# Patient Record
Sex: Male | Born: 1990
Health system: Southern US, Community
[De-identification: ages and names within clinical notes are randomized; demographics above are authoritative.]

## PROBLEM LIST (undated history)

## (undated) DIAGNOSIS — R519 Headache, unspecified: Secondary | ICD-10-CM

## (undated) DIAGNOSIS — R569 Unspecified convulsions: Secondary | ICD-10-CM

## (undated) DIAGNOSIS — M549 Dorsalgia, unspecified: Secondary | ICD-10-CM

## (undated) DIAGNOSIS — F419 Anxiety disorder, unspecified: Secondary | ICD-10-CM

## (undated) DIAGNOSIS — F32A Depression, unspecified: Secondary | ICD-10-CM

## (undated) HISTORY — PX: HERNIA REPAIR: SHX51

---
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
## 2011-12-01 ENCOUNTER — Ambulatory Visit (INDEPENDENT_AMBULATORY_CARE_PROVIDER_SITE_OTHER): Payer: 59 | Admitting: Internal Medicine

## 2011-12-01 VITALS — BP 117/66 | HR 68 | Temp 98.3°F | Resp 16 | Ht 71.0 in | Wt 212.0 lb

## 2011-12-01 DIAGNOSIS — S335XXA Sprain of ligaments of lumbar spine, initial encounter: Secondary | ICD-10-CM

## 2011-12-01 DIAGNOSIS — F17200 Nicotine dependence, unspecified, uncomplicated: Secondary | ICD-10-CM | POA: Insufficient documentation

## 2011-12-01 MED ORDER — MELOXICAM 15 MG PO TABS: 15.0000 mg | ORAL_TABLET | Freq: Every day | ORAL | Status: DC

## 2011-12-01 MED ORDER — CYCLOBENZAPRINE HCL 10 MG PO TABS: 10.0000 mg | ORAL_TABLET | Freq: Every day | ORAL | Status: DC

## 2011-12-01 NOTE — Progress Notes (Signed)
  Subjective:    Patient ID: Joseph Vazquez, male    DOB: May 18, 1990, 21 y.o.   MRN: [id number]  CC: 21yo W M c/o LBP  HPI After throwing Joseph Vazquez with friends yesterday the pt began to notice LBP.  His back did not bother him during the activity and he had no falls or injuries while participating.  He first noticed the pain upon standing up after resting.  He describes the pain as  "pinch" with occasional radiation along the lateral aspect of his R thigh that stops about half way down.  Standing and leaning back reproduce his radicular symptoms, but the pain in his low back is constant.  He was able to sleep well last night, but had to leave work today d/t pain.  He is a Investment banker, operational M-F day shift.  He tried taking Advil, but it did not help.    1 year ago he had similar symptoms.  Again, there was no injury, he just noticed this shooting pain when standing up out of his bed.  The problem self resolved within a few days.     Review of Systems GI: No problems with urination GU: No problems with BM Neuro: No sensory deficits    Objective:   Physical Exam General: 21 yo M pt cooperative and in no acute distress Vitals:  Filed Vitals:   12/01/11 1444  BP: 117/66  Pulse: 68  Temp: 98.3 F (36.8 C)  Resp: 16  HEENT: nontraumatic, EOMIT, normal to external exam, trachea midline Heart: Regular rate Lungs: No acute respiratory distress, no audible wheezing MSK: Normal bulk and tone. LE: 5/5 strength bilaterally  Lumbar Region: No bruising or deformity, tender to palpation paraspinally at L3/L4, L5 sp.p tender to palaption, full ROM but lumbar pain with extension and radicular pain down the lateral aspect of the R thigh with R facet loading.   Neuro: Alert, oriented, CN II - XII grossly IT, LE - sensory IT bilaterally, reflexes 1/4 bilaterally       Assessment & Plan:  1) Low back sprain -Flexeril 10 mg -Mobic 15 mg -Low back exercises -Note for work (rest for 2 days) -RTC if not better in 4  wks/sooner if not making progress so we can start formal PT

## 2011-12-10 ENCOUNTER — Ambulatory Visit: Payer: 59

## 2011-12-10 ENCOUNTER — Ambulatory Visit (INDEPENDENT_AMBULATORY_CARE_PROVIDER_SITE_OTHER): Payer: 59 | Admitting: Emergency Medicine

## 2011-12-10 VITALS — BP 110/78 | HR 74 | Temp 98.2°F | Resp 18 | Ht 71.0 in | Wt 216.0 lb

## 2011-12-10 DIAGNOSIS — M545 Low back pain: Secondary | ICD-10-CM

## 2011-12-10 DIAGNOSIS — N419 Inflammatory disease of prostate, unspecified: Secondary | ICD-10-CM

## 2011-12-10 DIAGNOSIS — N509 Disorder of male genital organs, unspecified: Secondary | ICD-10-CM

## 2011-12-10 DIAGNOSIS — N50811 Right testicular pain: Secondary | ICD-10-CM

## 2011-12-10 LAB — POCT UA - MICROSCOPIC ONLY
Bacteria, U Microscopic: NEGATIVE
Casts, Ur, LPF, POC: NEGATIVE
Crystals, Ur, HPF, POC: NEGATIVE
Mucus, UA: NEGATIVE
Yeast, UA: POSITIVE

## 2011-12-10 LAB — POCT CBC
Granulocyte percent: 73.9 %G (ref 37–80)
MCV: 97.7 fL — AB (ref 80–97)
MID (cbc): 0.5 (ref 0–0.9)
POC Granulocyte: 7.5 — AB (ref 2–6.9)
POC LYMPH PERCENT: 21.1 %L (ref 10–50)
POC MID %: 5 %M (ref 0–12)
Platelet Count, POC: 383 10*3/uL (ref 142–424)
RDW, POC: 12.3 %

## 2011-12-10 LAB — POCT URINALYSIS DIPSTICK
Bilirubin, UA: NEGATIVE
Blood, UA: NEGATIVE
Glucose, UA: NEGATIVE
Ketones, UA: NEGATIVE
Nitrite, UA: NEGATIVE
Protein, UA: NEGATIVE
Spec Grav, UA: 1.02
Urobilinogen, UA: 0.2
pH, UA: 7.5

## 2011-12-10 MED ORDER — DOXYCYCLINE HYCLATE 100 MG PO CAPS: 100.0000 mg | ORAL_CAPSULE | Freq: Two times a day (BID) | ORAL | Status: DC

## 2011-12-10 NOTE — Progress Notes (Signed)
  Subjective:    Patient ID: Joseph Vazquez, male    DOB: [birth year], 21 y.o.   MRN: [id number]  HPI patient was seen here last week with back pain. He enters today with continued back pain and pain in his testicles. He denies any burning on urination. He denies any pain with twisting of his back. He has no history of kidney stones and has seen no blood in his urine. He has not been sexually active for 6 months he does not have any discharge.    Review of Systems     Objective:   Physical Exam patient is alert cooperative in no distress. HEENT exam is unremarkable. His chest is clear his abdomen is soft nontender. There is tenderness over lower lumbar spine. Motor strength is 5 out of 5. Rectal exam was performed which revealed a normal size prostate which was nontender Results for orders placed in visit on 12/10/11  POCT UA - MICROSCOPIC ONLY      Component Value Range   WBC, Ur, HPF, POC 1-4     RBC, urine, microscopic 0-1     Bacteria, U Microscopic neg     Mucus, UA neg     Epithelial cells, urine per micros 0-4     Crystals, Ur, HPF, POC neg     Casts, Ur, LPF, POC neg     Yeast, UA positive    POCT URINALYSIS DIPSTICK      Component Value Range   Color, UA yellow     Clarity, UA clear     Glucose, UA neg     Bilirubin, UA neg     Ketones, UA neg     Spec Grav, UA 1.020     Blood, UA neg     pH, UA 7.5     Protein, UA neg     Urobilinogen, UA 0.2     Nitrite, UA neg     Leukocytes, UA Trace    POCT CBC      Component Value Range   WBC 10.1  4.6 - 10.2 K/uL   Lymph, poc 2.1  0.6 - 3.4   POC LYMPH PERCENT 21.1  10 - 50 %L   MID (cbc) 0.5  0 - 0.9   POC MID % 5.0  0 - 12 %M   POC Granulocyte 7.5 (*) 2 - 6.9   Granulocyte percent 73.9  37 - 80 %G   RBC 5.42  4.69 - 6.13 M/uL   Hemoglobin 17.2  14.1 - 18.1 g/dL   HCT, POC 84.1  32.4 - 53.7 %   MCV 97.7 (*) 80 - 97 fL   MCH, POC 31.7 (*) 27 - 31.2 pg   MCHC 32.5  31.8 - 35.4 g/dL   RDW, POC 40.1     Platelet Count, POC  383  142 - 424 K/uL   MPV 8.2  0 - 99.8 fL   [facility] reading (PRIMARY) by  Dr.Kimbella Heisler LS-spine films are normal. KUB is normal.        Assessment & Plan:  His WBC is upper limits of normal.  Will cover with doxycycline and schedule an ultrasound of the testicles and a non-contrast CT of the kidneys.

## 2011-12-11 LAB — GC/CHLAMYDIA PROBE AMP, GENITAL
Chlamydia, DNA Probe: NEGATIVE
GC Probe Amp, Genital: NEGATIVE

## 2011-12-11 NOTE — Addendum Note (Signed)
Addended byCaffie Damme on: 12/11/2011 11:11 AM   Modules accepted: Orders

## 2011-12-15 ENCOUNTER — Ambulatory Visit
Admission: RE | Admit: 2011-12-15 | Discharge: 2011-12-15 | Disposition: A | Discharge status: Home / Self Care | Payer: 59 | Source: Ambulatory Visit | Attending: Emergency Medicine | Admitting: Emergency Medicine

## 2011-12-15 DIAGNOSIS — N50811 Right testicular pain: Secondary | ICD-10-CM

## 2011-12-15 DIAGNOSIS — N419 Inflammatory disease of prostate, unspecified: Secondary | ICD-10-CM

## 2011-12-17 ENCOUNTER — Ambulatory Visit
Admission: RE | Admit: 2011-12-17 | Discharge: 2011-12-17 | Disposition: A | Discharge status: Home / Self Care | Payer: 59 | Source: Ambulatory Visit | Attending: Emergency Medicine | Admitting: Emergency Medicine

## 2011-12-17 DIAGNOSIS — N50811 Right testicular pain: Secondary | ICD-10-CM

## 2011-12-17 DIAGNOSIS — M545 Low back pain: Secondary | ICD-10-CM

## 2011-12-18 ENCOUNTER — Telehealth: Payer: Self-pay

## 2011-12-18 NOTE — Telephone Encounter (Signed)
Pt calling about CT and [place]. Gave him the results.

## 2011-12-18 NOTE — Telephone Encounter (Signed)
Pt returned call regarding his tests.  [phone]

## 2011-12-21 ENCOUNTER — Telehealth: Payer: Self-pay

## 2011-12-21 NOTE — Telephone Encounter (Signed)
The patient's mother called to request clinical staff return her call to discuss results of CT scan and ultrasound done last week.  Please call the patient's mother Efraim Kaufmann at work at [phone].

## 2011-12-22 NOTE — Telephone Encounter (Signed)
She is not on his HIPAA/ patient has been advised of the results. I called patient, to advise he needs to call his mother, we can not. He did not seem aware she had called.

## 2012-07-05 ENCOUNTER — Ambulatory Visit: Payer: 59

## 2012-07-05 ENCOUNTER — Ambulatory Visit (INDEPENDENT_AMBULATORY_CARE_PROVIDER_SITE_OTHER): Payer: 59 | Admitting: Family Medicine

## 2012-07-05 VITALS — BP 119/79 | HR 80 | Temp 98.3°F | Resp 18 | Wt 230.0 lb

## 2012-07-05 DIAGNOSIS — M25561 Pain in right knee: Secondary | ICD-10-CM

## 2012-07-05 DIAGNOSIS — M25569 Pain in unspecified knee: Secondary | ICD-10-CM

## 2012-07-05 DIAGNOSIS — S8991XA Unspecified injury of right lower leg, initial encounter: Secondary | ICD-10-CM

## 2012-07-05 DIAGNOSIS — S8990XA Unspecified injury of unspecified lower leg, initial encounter: Secondary | ICD-10-CM

## 2012-07-05 HISTORY — DX: Pain in right knee: M25.561

## 2012-07-05 MED ORDER — MELOXICAM 15 MG PO TABS: 15.0000 mg | ORAL_TABLET | Freq: Every day | ORAL | Status: DC

## 2012-07-05 NOTE — Assessment & Plan Note (Signed)
Patient's exam is consistent with potential early patellofemoral pain syndrome. I do not see any OCD lesion or any other significant bony abnormalities. We do have already depending on the x-ray of the right knee. Patient was given meloxicam and home exercise program. Patient will continue his regular work and then come back again in 2-3 weeks for further evaluation. If for some reason it seems to worsen we may want to consider further imaging with an MRI as well as corticosteroid injection and formal physical therapy.

## 2012-07-05 NOTE — Progress Notes (Signed)
Reason for visit: Right knee pain  History of present illness: Patient is a 22 year old male coming in with right knee pain for multiple weeks duration. Patient does not remember any true injury. Patient denies any radiation, numbness or swelling. Patient states it's worse when he goes from sitting to standing position or stands for long amount of time. Patient states that the pain seems to be superior to the knee itself on the anterior aspect. Patient denies any fever, chills, or an abnormal weight loss. 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 Patient denies any nighttime awakening. Patient does state that he is crepitus from time to time that can hurt.   Past medical history, social, surgical and family history all reviewed.   Physical exam Blood pressure 119/79, pulse 80, temperature 98.3 F (36.8 C), temperature source Oral, resp. rate 18, weight 230 lb (104.327 kg). General: No apparent distress alert and oriented x3 mood and affect normal Respiratory: Patient's speak in full sentences and does not appear short of breath Skin: Warm dry intact with no signs of infection or rash Neuro: Cranial nerves II through XII are intact, neurovascularly intact in all extremities with 2+ DTRs and 2+ pulses. Right knee exam: On inspection no gross deformity. Patient does have full active and passive range of motion. He is nontender on palpation of the entire knee. All ligaments are intact and has a negative McMurray's as well as a negative Thessaly Neurovascularly intact distally.  X-rays were ordered, reviewed and interpreted by me today. Patient's x-ray did not show any significant bony abnormality but did have some trabecular changes of the bone of the distal femoral condyle. This is only seen on one image though.  No fracture.

## 2012-07-05 NOTE — Patient Instructions (Signed)
Patellofemoral Pain  Your exam shows your knee pain is probably due to a problem with the knee cap, the patella. This problem is also called patellofemoral pain, runner's knee, or chondromalacia. Most of the time, this problem is due to overuse of the knee joint. Repeated bending and straightening can irritate the underside of the knee cap. When this happens, activities such as running, walking, climbing, biking or jumping usually produce pain. Pain may also occur after prolonged sitting. Other patellofemoral symptoms can include joint stiffness, swelling, and a snapping or grinding sensation with movement. Rest and rehabilitation are usually successful in treating this problem. Surgery is rarely needed.  Treatment includes correcting any mechanical factors that could hurt the normal working of the knee. This could be weak thigh muscles or foot problems. Avoid repetitive activities of the knee until the pain and other symptoms improve. Apply ice packs over the knee for 20 to 30 minutes every 2 to 4 hours to reduce pain and swelling. Only take over-the-counter or prescription medicines for pain, discomfort, or fever as directed by your caregiver. Knee braces or neoprene sleeves may help reduce irritation. Rehabilitation exercises to strengthen the quad muscle are often prescribed when your symptoms are better. Call your caregiver for a follow-up exam to evaluate your response to treatment.  Document Released: 03/05/2004 Document Revised: 04/20/2011 Document Reviewed: 01/26/2005  Exit[employer]® Patient Information ©2014 [employer].

## 2013-01-07 ENCOUNTER — Encounter (HOSPITAL_COMMUNITY): Payer: Self-pay | Admitting: Emergency Medicine

## 2013-01-07 ENCOUNTER — Emergency Department (HOSPITAL_COMMUNITY): Payer: 59

## 2013-01-07 ENCOUNTER — Emergency Department (HOSPITAL_COMMUNITY)
Admission: EM | Admit: 2013-01-07 | Discharge: 2013-01-07 | Disposition: A | Discharge status: Home / Self Care | Payer: 59 | Attending: Emergency Medicine | Admitting: Emergency Medicine

## 2013-01-07 DIAGNOSIS — Y929 Unspecified place or not applicable: Secondary | ICD-10-CM | POA: Insufficient documentation

## 2013-01-07 DIAGNOSIS — Y9323 Activity, snow (alpine) (downhill) skiing, snow boarding, sledding, tobogganing and snow tubing: Secondary | ICD-10-CM | POA: Insufficient documentation

## 2013-01-07 DIAGNOSIS — S43102A Unspecified dislocation of left acromioclavicular joint, initial encounter: Secondary | ICD-10-CM

## 2013-01-07 DIAGNOSIS — Z79899 Other long term (current) drug therapy: Secondary | ICD-10-CM | POA: Insufficient documentation

## 2013-01-07 DIAGNOSIS — S43109A Unspecified dislocation of unspecified acromioclavicular joint, initial encounter: Secondary | ICD-10-CM | POA: Insufficient documentation

## 2013-01-07 DIAGNOSIS — F17200 Nicotine dependence, unspecified, uncomplicated: Secondary | ICD-10-CM | POA: Insufficient documentation

## 2013-01-07 MED ORDER — OXYCODONE-ACETAMINOPHEN 5-325 MG PO TABS: 2.0000 | ORAL_TABLET | ORAL | Status: DC | PRN

## 2013-01-07 MED ORDER — METHOCARBAMOL 750 MG PO TABS: 750.0000 mg | ORAL_TABLET | Freq: Four times a day (QID) | ORAL | Status: DC

## 2013-01-07 NOTE — ED Notes (Signed)
Pt was snow boarding today in [place] at 10 am and fell on his left shoulder.  Pt is unable to lift arm,  5/10 pain left shoulder.  No other complaints.

## 2013-01-07 NOTE — ED Provider Notes (Signed)
CSN: [id number]     Arrival date & time 01/07/13  2134 History   First MD Initiated Contact with Patient 01/07/13 2157     Chief Complaint  Patient presents with  . Shoulder Pain   (Consider location/radiation/quality/duration/timing/severity/associated sxs/prior Treatment) Patient is a 22 y.o. male presenting with shoulder pain. The history is provided by the patient and a parent.  Shoulder Pain   patient here after falling on his left shoulder while snowboarding. No head injury or LOC. There is a severe sharp pain to the anterior portion of his left shoulder that is worse with movement. Denies any numbness to his shoulder or to his left hand. Symptoms are worse with movement better with rest. No treatment used prior to arrival  History reviewed. No pertinent past medical history. History reviewed. No pertinent past surgical history. No family history on file. History  Substance Use Topics  . Smoking status: Current Every Day Smoker -- 0.05 packs/day    Types: Cigarettes  . Smokeless tobacco: Current User  . Alcohol Use: Yes     Comment: Socially    Review of Systems  All other systems reviewed and are negative.    Allergies  Review of patient's allergies indicates no known allergies.  Home Medications   Current Outpatient Rx  Name  Route  Sig  Dispense  Refill  . naproxen sodium (ANAPROX) 220 MG tablet   Oral   Take 220 mg by mouth 2 (two) times daily with a meal.          BP 140/90  Pulse 84  Temp(Src) 98.4 F (36.9 C) (Oral)  Resp 19  Ht 6' (1.829 m)  Wt 225 lb (102.059 kg)  BMI 30.51 kg/m2  SpO2 97% Physical Exam  Nursing note and vitals reviewed. Constitutional: He is oriented to person, place, and time. He appears well-developed and well-nourished.  Non-toxic appearance. No distress.  HENT:  Head: Normocephalic and atraumatic.  Eyes: Conjunctivae, EOM and lids are normal. Pupils are equal, round, and reactive to light.  Neck: Normal range of motion.  Neck supple. No tracheal deviation present. No mass present.  Cardiovascular: Normal rate, regular rhythm and normal heart sounds.  Exam reveals no gallop.   No murmur heard. Pulmonary/Chest: Effort normal and breath sounds normal. No stridor. No respiratory distress. He has no decreased breath sounds. He has no wheezes. He has no rhonchi. He has no rales.  Abdominal: Soft. Normal appearance and bowel sounds are normal. He exhibits no distension. There is no tenderness. There is no rebound and no CVA tenderness.  Musculoskeletal: Normal range of motion. He exhibits no edema and no tenderness.       Arms: Neurological: He is alert and oriented to person, place, and time. He has normal strength. No cranial nerve deficit or sensory deficit. GCS eye subscore is 4. GCS verbal subscore is 5. GCS motor subscore is 6.  Skin: Skin is warm and dry. No abrasion and no rash noted.  Psychiatric: He has a normal mood and affect. His speech is normal and behavior is normal.    ED Course  Procedures (including critical care time) Labs Review Labs Reviewed - No data to display Imaging Review Dg Shoulder Left  01/07/2013   CLINICAL DATA:  22 year old male with left shoulder pain following injury.  EXAM: LEFT SHOULDER - 2+ VIEW  COMPARISON:  None.  FINDINGS: Offset at the [facility] joint is noted. There is equivocal widening of the coracoclavicular distance.  There is no evidence  of fracture.  The humeral head is located.  The remainder of the visualized left bony thorax is unremarkable.  IMPRESSION: AC separation. Coracoclavicular distance is upper limits of normal and this may represent a type 3 separation.   Electronically Signed   By: Laveda Abbe M.D.   On: 01/07/2013 22:19    EKG Interpretation   None       MDM  No diagnosis found. Patient without signs of dislocation to the shoulder but does have a type III a.c. separation per radiology. Will be given a sling, pain medication, orthopedic  referral    Toy Baker, MD 01/07/13 2233

## 2013-01-07 NOTE — ED Notes (Signed)
Left arm sling applied.

## 2013-01-07 NOTE — ED Notes (Signed)
Pt states he was snowboarding today and he fell and landed on his left shoulder. Pt has very limited ROM in the shoulder but can move his distal extremity. Pulses and sensation intact.

## 2014-01-08 ENCOUNTER — Ambulatory Visit (INDEPENDENT_AMBULATORY_CARE_PROVIDER_SITE_OTHER): Payer: 59 | Admitting: Internal Medicine

## 2014-01-08 VITALS — BP 124/78 | HR 86 | Temp 98.7°F | Resp 18 | Ht 71.25 in | Wt 102.4 lb

## 2014-01-08 DIAGNOSIS — A938 Other specified arthropod-borne viral fevers: Secondary | ICD-10-CM

## 2014-01-08 DIAGNOSIS — R21 Rash and other nonspecific skin eruption: Secondary | ICD-10-CM

## 2014-01-08 MED ORDER — DOXYCYCLINE HYCLATE 100 MG PO CAPS: 100.0000 mg | ORAL_CAPSULE | Freq: Two times a day (BID) | ORAL | Status: DC

## 2014-01-08 NOTE — Progress Notes (Signed)
   Subjective:    Patient ID: Joseph Vazquez, male    DOB: May 03, 1990, 23 y.o.   MRN: [id number]  HPI   23 year old white male present with chief complaint of tic bite  Removed tic 2 days ago from the left flank area.   The area is reddened and his mother and dad are concerned related to possible tic related illnesses.   He was mowing the yard a few days before the tic was discovered. No known fever, feels warm at times, no nausea or vomiting,  no joint pain just the red area around where the tic was removed 2 days ago.  No stiff neck or headache no rash..      Review of Systems  Constitutional: Negative.   HENT: Negative.   Eyes: Negative.   Respiratory: Negative.   Cardiovascular: Negative.   Gastrointestinal: Negative.   Endocrine: Negative.   Genitourinary: Negative.   Musculoskeletal: Negative.   Skin: Positive for wound.       Red area around area where a tic was removed 2 days ago  Allergic/Immunologic: Negative.   Neurological: Negative.   Hematological: Negative.   Psychiatric/Behavioral: Negative.   All other systems reviewed and are negative.      Objective:   Physical Exam  Constitutional: He is oriented to person, place, and time. He appears well-developed and well-nourished.  HENT:  Head: Normocephalic.  Right Ear: External ear normal.  Left Ear: External ear normal.  Nose: Nose normal.  Mouth/Throat: Oropharynx is clear and moist.  Eyes: EOM are normal. Pupils are equal, round, and reactive to light.  Neck: Normal range of motion. Neck supple.  Cardiovascular: Normal rate, regular rhythm and normal heart sounds.   Pulmonary/Chest: Effort normal and breath sounds normal.  Abdominal: Soft. Bowel sounds are normal.  Musculoskeletal: Normal range of motion.  Neurological: He is alert and oriented to person, place, and time.  Skin: Skin is warm and dry. Rash noted.  1cm area of induration and erythema around the area where a tic was removed 2 days ago on  the left flank No fluctuance or drainage no ring around it   Psychiatric: He has a normal mood and affect. His behavior is normal. Thought content normal.          Assessment & Plan:  Tic bite with area of induration and erythema surrounding it . Tic was removed 2 days ago , no systemic sighs of illness. Will rx with doxycycline.

## 2014-01-08 NOTE — Patient Instructions (Signed)
Take meds as directed. I f you develop fever vomiting rash return to the office. finish all the antibiotics. Stay out of the sun when you are taking this antibiotic.

## 2014-07-24 ENCOUNTER — Ambulatory Visit (INDEPENDENT_AMBULATORY_CARE_PROVIDER_SITE_OTHER): Payer: 59 | Admitting: Physician Assistant

## 2014-07-24 VITALS — BP 130/70 | HR 75 | Temp 98.7°F | Resp 17 | Ht 71.0 in | Wt 225.0 lb

## 2014-07-24 DIAGNOSIS — L0291 Cutaneous abscess, unspecified: Secondary | ICD-10-CM | POA: Diagnosis not present

## 2014-07-24 DIAGNOSIS — D72829 Elevated white blood cell count, unspecified: Secondary | ICD-10-CM

## 2014-07-24 LAB — POCT CBC
GRANULOCYTE PERCENT: 82.1 % — AB (ref 37–80)
HCT, POC: 46.7 % (ref 43.5–53.7)
Hemoglobin: 15.8 g/dL (ref 14.1–18.1)
Lymph, poc: 1.6 (ref 0.6–3.4)
MCH, POC: 31.1 pg (ref 27–31.2)
MCHC: 33.8 g/dL (ref 31.8–35.4)
MCV: 92.1 fL (ref 80–97)
MID (CBC): 0.5 (ref 0–0.9)
MPV: 6.9 fL (ref 0–99.8)
PLATELET COUNT, POC: 358 10*3/uL (ref 142–424)
POC GRANULOCYTE: 9.5 — AB (ref 2–6.9)
POC LYMPH %: 13.5 % (ref 10–50)
POC MID %: 4.4 %M (ref 0–12)
RBC: 5.07 M/uL (ref 4.69–6.13)
RDW, POC: 12.7 %
WBC: 11.6 10*3/uL — AB (ref 4.6–10.2)

## 2014-07-24 MED ORDER — DOXYCYCLINE HYCLATE 100 MG PO CAPS: 100.0000 mg | ORAL_CAPSULE | Freq: Two times a day (BID) | ORAL | Status: DC

## 2014-07-24 MED ORDER — CEFTRIAXONE SODIUM 1 G IJ SOLR
1.0000 g | Freq: Once | INTRAMUSCULAR | Status: AC
  Administered 2014-07-24: 1 g via INTRAMUSCULAR

## 2014-07-24 NOTE — Progress Notes (Signed)
07/24/2014 at 5:43 PM  Joseph Vazquez / DOB: Feb 05, 1991 / MRN: [id number]  The patient has GERD; OTHER SEBORRHEIC DERMATITIS; HYPERHIDROSIS; Nicotine addiction; and Right knee pain on his problem list.  SUBJECTIVE  Chief complaint: Abscess  Patient here today for an painful red mass on his upper left thigh roughly 2/3 distance along the inguinal crease. Reports this started 2 days ago and is worsening.  He tried to "pop it" a few times but states it is too tender.  Reports he has not had sex in over a year, and he denies penile discharge, dysuria.  He denies lesions on his penis and testicles, palms and soles.     He  has no past medical history on file.    Medications reviewed and updated by myself where necessary, and exist elsewhere in the encounter.   Joseph Vazquez has No Known Allergies. He  reports that he has been smoking Cigarettes.  He has been smoking about 0.05 packs per day. He uses smokeless tobacco. He reports that he drinks alcohol. He reports that he uses illicit drugs (Marijuana). He  reports that he currently engages in sexual activity. The patient  has no past surgical history on file.  His family history is not on file.  Review of Systems  Constitutional: Positive for diaphoresis. Negative for fever and malaise/fatigue.  Gastrointestinal: Negative for nausea, vomiting and abdominal pain.  Genitourinary: Negative for dysuria, urgency and frequency.  Musculoskeletal: Negative for myalgias.  Skin: Positive for rash. Negative for itching.  Neurological: Negative for dizziness and headaches.    OBJECTIVE  His  height is 5\' 11"  (1.803 m) and weight is 225 lb (102.059 kg). His oral temperature is 98.7 F (37.1 C). His blood pressure is 130/70 and his pulse is 75. His respiration is 17 and oxygen saturation is 98%.  The patient's body mass index is 31.39 kg/(m^2).  Physical Exam  Nursing note and vitals reviewed. Constitutional: He is oriented to person, place, and time.    Cardiovascular: Normal rate, regular rhythm and normal heart sounds.   Respiratory: Effort normal and breath sounds normal.  GI: Soft. Bowel sounds are normal. Hernia confirmed negative in the right inguinal area and confirmed negative in the left inguinal area.  Genitourinary: Testes normal and penis normal.    Right testis shows no mass, no swelling and no tenderness. Left testis shows no mass, no swelling and no tenderness. No penile tenderness. No discharge found.  Lymphadenopathy:       Right: No inguinal adenopathy present.       Left: No inguinal adenopathy present.  Neurological: He is alert and oriented to person, place, and time.    No results found for this or any previous visit (from the past 24 hour(s)).  ASSESSMENT & PLAN  Joseph Vazquez was seen today for abscess.  Diagnoses and all orders for this visit:  Abscess: The appearance of the lesion is consistent with moderately severe folliculitis. This may however be hydradenitis given location.  Will cover for staph with Doxy, as patient has had this before and tolerated without incident. Given the lack of fluctuance today advised that we do not drain the lesion, but advised patient that this will be reasonable if his symptoms do not improve with abx treatment.    Orders: -     POCT CBC -     Ceftriaxone 1 gram -     Doxycycline 100 mg po bid 10 days.     The patient was advised  to call or come back to clinic if he does not see an improvement in symptoms, or worsens with the above plan.   Deliah Boston, MHS, PA-C [facility] 07/24/2014 5:43 PM

## 2014-07-24 NOTE — Patient Instructions (Signed)

## 2014-11-15 ENCOUNTER — Ambulatory Visit (INDEPENDENT_AMBULATORY_CARE_PROVIDER_SITE_OTHER): Payer: 59

## 2014-11-15 ENCOUNTER — Ambulatory Visit (INDEPENDENT_AMBULATORY_CARE_PROVIDER_SITE_OTHER): Payer: 59 | Admitting: Family Medicine

## 2014-11-15 VITALS — BP 118/70 | HR 74 | Temp 98.5°F | Resp 18 | Ht 71.0 in | Wt 234.8 lb

## 2014-11-15 DIAGNOSIS — S99922A Unspecified injury of left foot, initial encounter: Secondary | ICD-10-CM | POA: Diagnosis not present

## 2014-11-15 NOTE — Patient Instructions (Signed)
You can ice the toe 3 times per day for 15 minutes.   Please let us know if the pain does not go away after the next 2 weeks.   Wear the coban as long as there is pain at that fifth toe.

## 2014-11-15 NOTE — Progress Notes (Signed)
[facility] [address] [place] [address] [phone]  Date:  11/15/2014   Name:  Joseph Vazquez   DOB:  [birth year]   MRN:  [id number]  PCP:  No PCP Per Patient    History of Present Illness:  Joseph Vazquez is a 24 y.o. male patient who present to [facility] for toe pain.  2 days ago, he was stepping over a doggy gate, when he hit his left fifth toe.  There was initial pain and bruising where the whole fifth toe turned dark.  He is able to bear weight, normal sensation, but the pain has worsened.     Patient Active Problem List   Diagnosis Date Noted  . Right knee pain 07/05/2012  . Nicotine addiction 12/01/2011  . GERD 09/17/2008  . OTHER SEBORRHEIC DERMATITIS 09/17/2008  . HYPERHIDROSIS 09/17/2008    History reviewed. No pertinent past medical history.  History reviewed. No pertinent past surgical history.  Social History  Substance Use Topics  . Smoking status: Current Every Day Smoker -- 0.05 packs/day    Types: Cigarettes  . Smokeless tobacco: Current User  . Alcohol Use: Yes     Comment: Socially    History reviewed. No pertinent family history.  No Known Allergies  Medication list has been reviewed and updated.  Current Outpatient Prescriptions on File Prior to Visit  Medication Sig Dispense Refill  . doxycycline (VIBRAMYCIN) 100 MG capsule Take 1 capsule (100 mg total) by mouth 2 (two) times daily. (Patient not taking: Reported on 11/15/2014) 20 capsule 0  . methocarbamol (ROBAXIN-750) 750 MG tablet Take 1 tablet (750 mg total) by mouth 4 (four) times daily. (Patient not taking: Reported on 11/15/2014) 30 tablet 0  . naproxen sodium (ANAPROX) 220 MG tablet Take 220 mg by mouth 2 (two) times daily with a meal.     No current facility-administered medications on file prior to visit.    ROS ROS otherwise unremarkable unless listed above.    Physical Examination: BP 118/70 mmHg  Pulse 74  Temp(Src) 98.5 F (36.9 C) (Oral)  Resp 18  Ht 5'  11" (1.803 m)  Wt 234 lb 12.8 oz (106.505 kg)  BMI 32.76 kg/m2  SpO2 99% Ideal Body Weight: Weight in (lb) to have BMI = 25: 178.9  Physical Exam  Constitutional: He is oriented to person, place, and time. He appears well-developed and well-nourished. No distress.  HENT:  Head: Normocephalic and atraumatic.  Eyes: Conjunctivae and EOM are normal. Pupils are equal, round, and reactive to light.  Cardiovascular: Normal rate.   Pulmonary/Chest: Effort normal. No respiratory distress.  Musculoskeletal:  Fifth toe erythematous, with superficial petechiae at the 5th medal side of digit.  No open wounds.  Tender with palpaton.  Tender at the mtp dominant.  Normal resisted dorsi-/plantar flexion.  Negative squeeze test.  Neurological: He is alert and oriented to person, place, and time.  Skin: Skin is warm and dry. He is not diaphoretic.  Psychiatric: He has a normal mood and affect. His behavior is normal.   [facility] reading (PRIMARY) by  Dr. Patsy Lager: Negative  Assessment and Plan: 24 year old male is here for chief complaint of toe pain.  Appears to be a sprain.  I am buddying the 5th and 4th toe at this time, and advised to discontinue when pain stops.  rtn in 2 weeks if sxs do not resolve.   Foot injury, left, initial encounter - Plan: DG Foot Complete Left  Judeth Cornfield  Lenox Ponds, PA-C [facility] 11/15/2014 4:17 PM

## 2015-07-15 ENCOUNTER — Other Ambulatory Visit: Payer: Self-pay | Admitting: Specialist

## 2015-07-15 DIAGNOSIS — G8929 Other chronic pain: Secondary | ICD-10-CM

## 2015-07-15 DIAGNOSIS — M545 Low back pain, unspecified: Secondary | ICD-10-CM

## 2015-07-30 ENCOUNTER — Ambulatory Visit
Admission: RE | Admit: 2015-07-30 | Discharge: 2015-07-30 | Disposition: A | Discharge status: Home / Self Care | Payer: 59 | Source: Ambulatory Visit | Attending: Specialist | Admitting: Specialist

## 2015-07-30 ENCOUNTER — Ambulatory Visit
Admission: RE | Admit: 2015-07-30 | Discharge: 2015-07-30 | Disposition: A | Discharge status: Home / Self Care | Payer: Self-pay | Source: Ambulatory Visit | Attending: Specialist | Admitting: Specialist

## 2015-07-30 DIAGNOSIS — M545 Low back pain: Principal | ICD-10-CM

## 2015-07-30 DIAGNOSIS — G8929 Other chronic pain: Secondary | ICD-10-CM

## 2015-07-30 MED ORDER — DIAZEPAM 5 MG PO TABS
10.0000 mg | ORAL_TABLET | Freq: Once | ORAL | Status: AC
  Administered 2015-07-30: 10 mg via ORAL

## 2015-07-30 MED ORDER — IOPAMIDOL (ISOVUE-M 200) INJECTION 41%
15.0000 mL | Freq: Once | INTRAMUSCULAR | Status: AC
  Administered 2015-07-30: 15 mL via INTRATHECAL

## 2015-07-30 NOTE — Discharge Instructions (Signed)

## 2015-12-25 ENCOUNTER — Telehealth (INDEPENDENT_AMBULATORY_CARE_PROVIDER_SITE_OTHER): Payer: Self-pay | Admitting: Specialist

## 2015-12-25 MED ORDER — ACETAMINOPHEN-CODEINE #3 300-30 MG PO TABS
1.0000 | ORAL_TABLET | Freq: Three times a day (TID) | ORAL | 0 refills | Status: DC | PRN
Start: 2015-12-25 — End: 2016-01-30

## 2015-12-25 NOTE — Telephone Encounter (Signed)
Called pt and left vm advising rx ready to be picked up at front desk  Thanks. Herbert SetaHeather

## 2015-12-25 NOTE — Telephone Encounter (Signed)
Rx refill for pain medication, pt states last time he received Tramadol and would like something different. States Tramadol hurts his stomach

## 2015-12-25 NOTE — Telephone Encounter (Signed)
See below

## 2016-01-28 ENCOUNTER — Telehealth (INDEPENDENT_AMBULATORY_CARE_PROVIDER_SITE_OTHER): Payer: Self-pay | Admitting: Specialist

## 2016-01-28 NOTE — Telephone Encounter (Signed)
Patient called needing Rx refilled (tylenol)  The number to contact him is [phone]

## 2016-01-28 NOTE — Telephone Encounter (Signed)
Holding this one for you to ask JN

## 2016-01-29 NOTE — Telephone Encounter (Signed)
Please advise, pt requesting refill on tylenol #3

## 2016-01-30 ENCOUNTER — Other Ambulatory Visit (INDEPENDENT_AMBULATORY_CARE_PROVIDER_SITE_OTHER): Payer: Self-pay | Admitting: Specialist

## 2016-01-30 MED ORDER — ACETAMINOPHEN-CODEINE #3 300-30 MG PO TABS
1.0000 | ORAL_TABLET | Freq: Three times a day (TID) | ORAL | 0 refills | Status: DC | PRN
Start: 2016-01-30 — End: 2016-03-17

## 2016-01-30 NOTE — Telephone Encounter (Signed)
Rx printed and signed, please call to his pharmacy. jen

## 2016-01-30 NOTE — Telephone Encounter (Signed)
rx called in to patients pharmacy, called patient and advised.  Thanks. Joseph Vazquez

## 2016-03-17 ENCOUNTER — Telehealth (INDEPENDENT_AMBULATORY_CARE_PROVIDER_SITE_OTHER): Payer: Self-pay | Admitting: Specialist

## 2016-03-17 ENCOUNTER — Other Ambulatory Visit (INDEPENDENT_AMBULATORY_CARE_PROVIDER_SITE_OTHER): Payer: Self-pay | Admitting: Specialist

## 2016-03-17 MED ORDER — ACETAMINOPHEN-CODEINE #3 300-30 MG PO TABS: 1.0000 | ORAL_TABLET | Freq: Three times a day (TID) | ORAL | 0 refills | Status: DC | PRN

## 2016-03-17 NOTE — Telephone Encounter (Signed)
PATIENT CALLED NEEDING A REFILL ON ACETAMINOPHEN CODEINE. CB # [phone] ALSO WANTED TO CHANGE HIS PHARMACY TO [facility].

## 2016-03-17 NOTE — Telephone Encounter (Signed)
Rx for tylenol #3  60 prescribed, signed and this can be called into pharmacy or he can pick it up. jen

## 2016-03-17 NOTE — Telephone Encounter (Signed)
PATIENT CALLED NEEDING A REFILL ON ACETAMINOPHEN CODEINE

## 2016-03-18 ENCOUNTER — Telehealth (INDEPENDENT_AMBULATORY_CARE_PROVIDER_SITE_OTHER): Payer: Self-pay | Admitting: Specialist

## 2016-03-18 ENCOUNTER — Telehealth (INDEPENDENT_AMBULATORY_CARE_PROVIDER_SITE_OTHER): Payer: Self-pay | Admitting: *Deleted

## 2016-03-18 NOTE — Telephone Encounter (Signed)
Patient called and stated [facility] on [address].  [facility]S informed the patient that they had not received a prescription from our office.  [phone]#[phone].  Thank You

## 2016-03-18 NOTE — Telephone Encounter (Signed)
Patient called in this morning in regards to wanting us to  Send his prescription to this [facility] on [address] location please. Thank you His CB # [phone].

## 2016-03-18 NOTE — Telephone Encounter (Signed)
Patiet got rx from [facility] on [address]

## 2016-03-18 NOTE — Telephone Encounter (Signed)
Patiet got rx from [facility] on [address] 

## 2016-03-18 NOTE — Telephone Encounter (Signed)
Left message on machine that his rx was called into pharmacy last night

## 2016-04-21 ENCOUNTER — Telehealth (INDEPENDENT_AMBULATORY_CARE_PROVIDER_SITE_OTHER): Payer: Self-pay | Admitting: Specialist

## 2016-04-21 NOTE — Telephone Encounter (Signed)
Pt requests refill of Acetaminophen with Codeine

## 2016-04-21 NOTE — Telephone Encounter (Signed)
Pt requests refill of Acetaminophen with Codeine   [phone]

## 2016-04-22 ENCOUNTER — Other Ambulatory Visit (INDEPENDENT_AMBULATORY_CARE_PROVIDER_SITE_OTHER): Payer: Self-pay | Admitting: Specialist

## 2016-04-22 MED ORDER — ACETAMINOPHEN-CODEINE #3 300-30 MG PO TABS: 1.0000 | ORAL_TABLET | Freq: Three times a day (TID) | ORAL | 0 refills | Status: DC | PRN

## 2016-04-22 NOTE — Telephone Encounter (Signed)
Rx printed, but he needs a follow up appointment before we can refill further.

## 2016-04-23 NOTE — Telephone Encounter (Signed)
Pt is aware rx is ready for pick up at the front desk. 

## 2016-05-25 ENCOUNTER — Telehealth (INDEPENDENT_AMBULATORY_CARE_PROVIDER_SITE_OTHER): Payer: Self-pay | Admitting: *Deleted

## 2016-05-25 NOTE — Telephone Encounter (Signed)
Pt needs refill acetaminophen with codeine. Sent to [facility] [address]

## 2016-05-25 NOTE — Telephone Encounter (Signed)
Pt needs refill acetaminophen with codeine

## 2016-06-01 ENCOUNTER — Other Ambulatory Visit (INDEPENDENT_AMBULATORY_CARE_PROVIDER_SITE_OTHER): Payer: Self-pay | Admitting: Specialist

## 2016-06-01 MED ORDER — ACETAMINOPHEN-CODEINE #3 300-30 MG PO TABS
1.0000 | ORAL_TABLET | Freq: Three times a day (TID) | ORAL | 0 refills | Status: DC | PRN
Start: 2016-06-01 — End: 2016-07-03

## 2016-06-02 ENCOUNTER — Telehealth (INDEPENDENT_AMBULATORY_CARE_PROVIDER_SITE_OTHER): Payer: Self-pay

## 2016-06-02 NOTE — Telephone Encounter (Signed)
[facility] [address]  Pt called and states that he has contacted his pharmacy and that his rx has still not been received. Would like this to be refaxed and called once this has been done.

## 2016-06-02 NOTE — Telephone Encounter (Signed)
Pt asked for any future RX to be sent to the [facility] on [address].

## 2016-06-02 NOTE — Telephone Encounter (Signed)
I called and advised pt that his rx was called to the [facility] at [address] 

## 2016-06-02 NOTE — Telephone Encounter (Signed)
I called and advised pt that his rx was called to the [facility] at [address] 

## 2016-06-02 NOTE — Progress Notes (Signed)
I called and advised pt that his rx was called to the [facility] at [address]

## 2016-07-03 ENCOUNTER — Other Ambulatory Visit (INDEPENDENT_AMBULATORY_CARE_PROVIDER_SITE_OTHER): Payer: Self-pay

## 2016-07-03 MED ORDER — ACETAMINOPHEN-CODEINE #3 300-30 MG PO TABS: 1.0000 | ORAL_TABLET | Freq: Three times a day (TID) | ORAL | 0 refills | Status: DC | PRN

## 2016-07-03 NOTE — Telephone Encounter (Signed)
Patient would like a Rx refill on Tylenol #3.  CB# is [phone]

## 2016-07-03 NOTE — Addendum Note (Signed)
Addended by: Penne LashSHUE WILLS, Otis DialsHRISTY N on: 07/03/2016 09:36 AM   Modules accepted: Orders

## 2016-07-03 NOTE — Telephone Encounter (Signed)
Lmom that rx was [facility] to [facility]

## 2016-08-04 ENCOUNTER — Other Ambulatory Visit (INDEPENDENT_AMBULATORY_CARE_PROVIDER_SITE_OTHER): Payer: Self-pay

## 2016-08-04 NOTE — Telephone Encounter (Signed)
Patient would like a Rx refill on Tylenol#3.  Cb# is [phone].  Please Advise.  Thank You.

## 2016-08-04 NOTE — Addendum Note (Signed)
Addended by: Penne LashSHUE WILLS, Neysa BonitoHRISTY N on: 08/04/2016 11:35 AM   Modules accepted: Orders

## 2016-08-05 MED ORDER — ACETAMINOPHEN-CODEINE #3 300-30 MG PO TABS: 1.0000 | ORAL_TABLET | Freq: Three times a day (TID) | ORAL | 0 refills | Status: DC | PRN

## 2016-08-05 NOTE — Telephone Encounter (Signed)
Called to [facility] [address]

## 2016-08-06 ENCOUNTER — Telehealth (INDEPENDENT_AMBULATORY_CARE_PROVIDER_SITE_OTHER): Payer: Self-pay | Admitting: Radiology

## 2016-08-06 NOTE — Telephone Encounter (Signed)
I called and spoke with Dorene SorrowJerry at [facility] [address] and they do have the  Rx, they just need the DEA # and I gave them this info and I advised them to call us back next time that this should not have delayed the patient from getting his meds.

## 2016-09-01 ENCOUNTER — Other Ambulatory Visit (INDEPENDENT_AMBULATORY_CARE_PROVIDER_SITE_OTHER): Payer: Self-pay

## 2016-09-01 NOTE — Telephone Encounter (Signed)
Patient would like a Rx refill on Tylenol #3.  Cb# is [phone].  Please advise.  Thank You.

## 2016-09-02 ENCOUNTER — Telehealth (INDEPENDENT_AMBULATORY_CARE_PROVIDER_SITE_OTHER): Payer: Self-pay | Admitting: Specialist

## 2016-09-02 NOTE — Addendum Note (Signed)
Addended by: Penne LashSHUE WILLS, Neysa BonitoHRISTY N on: 09/02/2016 10:35 AM   Modules accepted: Orders

## 2016-09-02 NOTE — Telephone Encounter (Signed)
Dr. Otelia SergeantNitka has a message regarding this in his box pending approval.

## 2016-09-02 NOTE — Telephone Encounter (Signed)
[facility] [address]. Pt states he called yesterday to request refill Tylenol #3. Please call in to pharmacy and confirm with pt.

## 2016-09-03 MED ORDER — ACETAMINOPHEN-CODEINE #3 300-30 MG PO TABS: 1.0000 | ORAL_TABLET | Freq: Three times a day (TID) | ORAL | 0 refills | Status: DC | PRN

## 2016-09-03 NOTE — Telephone Encounter (Signed)
I called rx to [facility], Patient is aware

## 2016-10-05 ENCOUNTER — Other Ambulatory Visit (INDEPENDENT_AMBULATORY_CARE_PROVIDER_SITE_OTHER): Payer: Self-pay | Admitting: Specialist

## 2016-10-05 NOTE — Telephone Encounter (Signed)
Patient called needing Rx refilled (Tylenol #3) Patient also asked if Dr Otelia Sergeant can refer him for dry needling. The number to contact patient is [phone]

## 2016-10-08 MED ORDER — ACETAMINOPHEN-CODEINE #3 300-30 MG PO TABS: 1.0000 | ORAL_TABLET | Freq: Three times a day (TID) | ORAL | 0 refills | Status: DC | PRN

## 2016-10-08 NOTE — Telephone Encounter (Signed)
rx called to [facility]---Patient is aware.

## 2016-11-03 ENCOUNTER — Other Ambulatory Visit (INDEPENDENT_AMBULATORY_CARE_PROVIDER_SITE_OTHER): Payer: Self-pay | Admitting: Specialist

## 2016-11-03 NOTE — Addendum Note (Signed)
Addended by: Penne Lash, Otis Dials on: 11/03/2016 04:57 PM   Modules accepted: Orders

## 2016-11-03 NOTE — Telephone Encounter (Signed)
Patient called asking for a refill on tylenol 3's. CB # [phone]

## 2016-11-05 MED ORDER — ACETAMINOPHEN-CODEINE #3 300-30 MG PO TABS: 1.0000 | ORAL_TABLET | Freq: Three times a day (TID) | ORAL | 0 refills | Status: DC | PRN

## 2016-11-05 NOTE — Telephone Encounter (Signed)
[facility] is aware

## 2017-01-07 ENCOUNTER — Telehealth (INDEPENDENT_AMBULATORY_CARE_PROVIDER_SITE_OTHER): Payer: Self-pay | Admitting: Radiology

## 2017-01-07 NOTE — Telephone Encounter (Signed)
Patient is calling wanting to know who you would recommend for pain management? Please advise.

## 2017-01-18 ENCOUNTER — Ambulatory Visit (INDEPENDENT_AMBULATORY_CARE_PROVIDER_SITE_OTHER): Payer: 59 | Admitting: Specialist

## 2017-02-15 ENCOUNTER — Ambulatory Visit (INDEPENDENT_AMBULATORY_CARE_PROVIDER_SITE_OTHER): Payer: PRIVATE HEALTH INSURANCE

## 2017-02-15 ENCOUNTER — Ambulatory Visit (INDEPENDENT_AMBULATORY_CARE_PROVIDER_SITE_OTHER): Payer: PRIVATE HEALTH INSURANCE | Admitting: Specialist

## 2017-02-15 ENCOUNTER — Encounter (INDEPENDENT_AMBULATORY_CARE_PROVIDER_SITE_OTHER): Payer: Self-pay | Admitting: Specialist

## 2017-02-15 VITALS — BP 127/83 | HR 73 | Ht 72.0 in | Wt 230.0 lb

## 2017-02-15 DIAGNOSIS — M5442 Lumbago with sciatica, left side: Secondary | ICD-10-CM

## 2017-02-15 DIAGNOSIS — M5441 Lumbago with sciatica, right side: Secondary | ICD-10-CM

## 2017-02-15 DIAGNOSIS — M4316 Spondylolisthesis, lumbar region: Secondary | ICD-10-CM

## 2017-02-15 MED ORDER — METHYLPREDNISOLONE ACETATE 80 MG/ML IJ SUSP: 80.0000 mg | Freq: Once | INTRAMUSCULAR | Status: DC

## 2017-02-15 MED ORDER — METHOCARBAMOL 500 MG PO TABS: 500.0000 mg | ORAL_TABLET | Freq: Three times a day (TID) | ORAL | 0 refills | Status: DC | PRN

## 2017-02-15 MED ORDER — METHYLPREDNISOLONE 4 MG PO TABS: ORAL_TABLET | ORAL | 0 refills | Status: DC

## 2017-02-15 MED ORDER — HYDROCODONE-ACETAMINOPHEN 5-325 MG PO TABS: 1.0000 | ORAL_TABLET | Freq: Two times a day (BID) | ORAL | 0 refills | Status: DC | PRN

## 2017-02-15 NOTE — Progress Notes (Addendum)
Office Visit Note   Patient: Joseph Vazquez           Date of Birth: 07-18-1990           MRN: [id number] Visit Date: 02/15/2017              Requested by: No referring provider defined for this encounter. PCP: Patient, No Pcp Per   Assessment & Plan: Visit Diagnoses:  1. Bilateral low back pain with bilateral sciatica, unspecified chronicity   2. Spondylolisthesis, lumbar region   3. Spondylolisthesis of lumbar region     Plan: With patient's progressively worsening symptoms I recommend repeating lumbar spine MRI.  Follow-up with Joseph Vazquez after completion to discuss results and further treatment options.  His x-rays from today were reviewed.  These did show the space collapse at L4-5 and L5-S1 with a few millimeters L4 retrolisthesis.  Follow-Up Instructions: Return in about 2 weeks (around 03/01/2017) for with Dr Otelia Vazquez to review Lumbar MRI.   Orders:  Orders Placed This Encounter  Procedures  . XR Lumbar Spine 2-3 Views  . MR Lumbar Spine w/o contrast   Meds ordered this encounter  Medications  . methylPREDNISolone acetate (DEPO-MEDROL) injection 80 mg  . methylPREDNISolone (MEDROL) 4 MG tablet    Sig: 6 day taper to be taken as directed.    Dispense:  21 tablet    Refill:  0    Start 16 Feb 2017  . methocarbamol (ROBAXIN) 500 MG tablet    Sig: Take 1 tablet (500 mg total) by mouth every 8 (eight) hours as needed for muscle spasms.    Dispense:  50 tablet    Refill:  0  . HYDROcodone-acetaminophen (NORCO) 5-325 MG tablet    Sig: Take 1 tablet by mouth every 12 (twelve) hours as needed for moderate pain.    Dispense:  30 tablet    Refill:  0      Procedures: No procedures performed   Clinical Data: No additional findings.   Subjective: Chief Complaint  Patient presents with  . Lower Back - Pain    HPI Patient comes in today with complaints of back pain with right greater than left lower extremity radiculopathy.  Patient has chronic history of lumbar spine  issues.  CT lumbar spine performed July 30, 2015 and report showed L4-5 4 mm of retrolisthesis with central protrusion.  Short pedicles.  Mild facet arthropathy.  Bilateral L5 nerve root effacement.  L5-S1 central protrusion with mild stenosis.  This projects between both S1 nerve roots.  Short pedicles.  States that he has had increased pain for a few months.  Pain radiates to the right greater left buttock and down to both feet.  While in the grocery store he does have to lean forward in order to help relieve some of his symptoms.  Denies bowel or bladder incontinence.  Patient works with [employer] and his job does involve him doing a lot of heavy lifting.  Has had some numbness and tingling down both legs.  Has been doing home exercises which includes yoga without any improvement. Review of Systems No current cardiac pulmonary GI GU issues  Objective: Vital Signs: BP 127/83 (BP Location: Left Arm, Patient Position: Sitting)   Pulse 73   Ht 6' (1.829 m)   Wt 230 lb (104.3 kg)   BMI 31.19 kg/m   Physical Exam  Constitutional: He is oriented to person, place, and time. He appears well-developed.  HENT:  Head: Normocephalic and atraumatic.  Eyes: Pupils are equal, round, and reactive to light. EOM are normal.  Pulmonary/Chest: No respiratory distress.  Abdominal: He exhibits no distension.  Musculoskeletal:  Gait is somewhat antalgic.  Pain with lumbar flexion and extension.  Lumbar paraspinal tenderness.  Negative logroll.  Pain with bilateral straight leg raise.  No focal motor deficits.  Neurovascular intact.  Bilateral calves are nontender.  Neurological: He is alert and oriented to person, place, and time.  Skin: Skin is warm and dry.  Psychiatric: He has a normal mood and affect.    Ortho Exam  Specialty Comments:  No specialty comments available.  Imaging: No results found.   PMFS History: Patient Active Problem List   Diagnosis Date Noted  . Right knee pain 07/05/2012  .  Nicotine addiction 12/01/2011  . GERD 09/17/2008  . OTHER SEBORRHEIC DERMATITIS 09/17/2008  . HYPERHIDROSIS 09/17/2008   History reviewed. No pertinent past medical history.  History reviewed. No pertinent family history.  History reviewed. No pertinent surgical history. Social History   Occupational History  . Not on file  Tobacco Use  . Smoking status: Current Every Day Smoker    Packs/day: 0.05    Types: Cigarettes  . Smokeless tobacco: Current User  Substance and Sexual Activity  . Alcohol use: Yes    Comment: Socially  . Drug use: Yes    Types: Marijuana  . Sexual activity: Yes

## 2017-02-18 ENCOUNTER — Ambulatory Visit (INDEPENDENT_AMBULATORY_CARE_PROVIDER_SITE_OTHER): Payer: Self-pay | Admitting: Orthopedic Surgery

## 2017-03-11 ENCOUNTER — Ambulatory Visit (INDEPENDENT_AMBULATORY_CARE_PROVIDER_SITE_OTHER): Payer: PRIVATE HEALTH INSURANCE | Admitting: Specialist

## 2017-03-15 ENCOUNTER — Ambulatory Visit (INDEPENDENT_AMBULATORY_CARE_PROVIDER_SITE_OTHER): Payer: PRIVATE HEALTH INSURANCE | Admitting: Specialist

## 2017-03-16 ENCOUNTER — Telehealth (INDEPENDENT_AMBULATORY_CARE_PROVIDER_SITE_OTHER): Payer: Self-pay | Admitting: *Deleted

## 2017-03-16 NOTE — Telephone Encounter (Signed)
IC pt insurance [employer] to check on status of authorization and the representative advised me that pt insurance has termed since 02/17/17, I tried calling pt to advise him of this and vm has not been set up. I will try again later.

## 2017-03-17 NOTE — Telephone Encounter (Signed)
I tried calling pt again and vm has not been set up. Will try again later

## 2017-05-13 ENCOUNTER — Encounter: Payer: Self-pay | Admitting: Surgery

## 2017-05-13 ENCOUNTER — Ambulatory Visit (INDEPENDENT_AMBULATORY_CARE_PROVIDER_SITE_OTHER): Payer: Self-pay

## 2017-05-13 ENCOUNTER — Ambulatory Visit (INDEPENDENT_AMBULATORY_CARE_PROVIDER_SITE_OTHER): Payer: BLUE CROSS/BLUE SHIELD | Admitting: Surgery

## 2017-05-13 DIAGNOSIS — M5442 Lumbago with sciatica, left side: Secondary | ICD-10-CM

## 2017-05-13 DIAGNOSIS — M5441 Lumbago with sciatica, right side: Secondary | ICD-10-CM | POA: Diagnosis not present

## 2017-05-13 MED ORDER — HYDROCODONE-ACETAMINOPHEN 5-325 MG PO TABS: 1.0000 | ORAL_TABLET | Freq: Two times a day (BID) | ORAL | 0 refills | Status: DC | PRN
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
## 2017-05-17 ENCOUNTER — Telehealth (INDEPENDENT_AMBULATORY_CARE_PROVIDER_SITE_OTHER): Payer: Self-pay | Admitting: Specialist

## 2017-05-17 NOTE — Telephone Encounter (Signed)
Patient states he's doing much better & would like to return to work tomorrow 05/18/17 or Wed 05/19/17. Pt saw Fayrene FearingJames and was giving a out of work note for the week but he wants to go back sooner. Please call asap

## 2017-05-18 ENCOUNTER — Emergency Department (HOSPITAL_COMMUNITY)
Admission: EM | Admit: 2017-05-18 | Discharge: 2017-05-18 | Disposition: A | Discharge status: Home / Self Care | Payer: BLUE CROSS/BLUE SHIELD | Attending: Emergency Medicine | Admitting: Emergency Medicine

## 2017-05-18 ENCOUNTER — Emergency Department (HOSPITAL_COMMUNITY): Payer: BLUE CROSS/BLUE SHIELD

## 2017-05-18 ENCOUNTER — Encounter (HOSPITAL_COMMUNITY): Payer: Self-pay | Admitting: *Deleted

## 2017-05-18 DIAGNOSIS — H60502 Unspecified acute noninfective otitis externa, left ear: Secondary | ICD-10-CM | POA: Insufficient documentation

## 2017-05-18 DIAGNOSIS — R51 Headache: Secondary | ICD-10-CM | POA: Insufficient documentation

## 2017-05-18 DIAGNOSIS — Z79899 Other long term (current) drug therapy: Secondary | ICD-10-CM | POA: Insufficient documentation

## 2017-05-18 DIAGNOSIS — H6690 Otitis media, unspecified, unspecified ear: Secondary | ICD-10-CM | POA: Diagnosis not present

## 2017-05-18 DIAGNOSIS — S00402D Unspecified superficial injury of left ear, subsequent encounter: Secondary | ICD-10-CM | POA: Diagnosis present

## 2017-05-18 DIAGNOSIS — F17210 Nicotine dependence, cigarettes, uncomplicated: Secondary | ICD-10-CM | POA: Insufficient documentation

## 2017-05-18 MED ORDER — NEOMYCIN-POLYMYXIN-HC 3.5-10000-1 OT SOLN: 4.0000 [drp] | Freq: Four times a day (QID) | OTIC | 0 refills | Status: AC

## 2017-05-18 MED ORDER — AMOXICILLIN 500 MG PO CAPS: 500.0000 mg | ORAL_CAPSULE | Freq: Two times a day (BID) | ORAL | 0 refills | Status: AC
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
## 2017-05-18 NOTE — Discharge Instructions (Addendum)
You may have diarrhea from the antibiotics.  It is very important that you continue to take the antibiotics even if you get diarrhea unless a medical professional tells you that you may stop taking them.  If you stop too early the bacteria you are being treated for will become stronger and you may need different, more powerful antibiotics that have more side effects and worsening diarrhea.  Please stay well hydrated and consider probiotics as they may decrease the severity of your diarrhea.    Please consider taking a daily allergy medication to help with your symptoms.  I suggest a less drowsy 24 hour medication such as allegra, zyrtec or Claritin or the generic version.

## 2017-05-18 NOTE — Telephone Encounter (Signed)
I spoke with Fayrene FearingJames, he wants the patient to be reevaluated before he returns to work, patient has been sched for 05/19/17 @ 915 with Fayrene Fearingjames
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
## 2017-05-19 ENCOUNTER — Encounter (INDEPENDENT_AMBULATORY_CARE_PROVIDER_SITE_OTHER): Payer: Self-pay | Admitting: Surgery

## 2017-05-19 ENCOUNTER — Ambulatory Visit (INDEPENDENT_AMBULATORY_CARE_PROVIDER_SITE_OTHER): Payer: BLUE CROSS/BLUE SHIELD | Admitting: Surgery

## 2017-05-19 ENCOUNTER — Ambulatory Visit (INDEPENDENT_AMBULATORY_CARE_PROVIDER_SITE_OTHER): Payer: PRIVATE HEALTH INSURANCE | Admitting: Surgery

## 2017-05-19 DIAGNOSIS — M5441 Lumbago with sciatica, right side: Secondary | ICD-10-CM

## 2017-05-19 DIAGNOSIS — M4316 Spondylolisthesis, lumbar region: Secondary | ICD-10-CM

## 2017-05-19 DIAGNOSIS — M5442 Lumbago with sciatica, left side: Secondary | ICD-10-CM

## 2017-05-19 NOTE — Progress Notes (Signed)
   Patient return for an earlier appointment than scheduled.  He initially came in to be cleared to return back to work before my original plan.  He still continues to complain of pain in the right low back with some intermittent right lower extremity radiculopathy.  Patient does Holiday representativeconstruction work.  He was also recently diagnosed with an ear infection.  Regards to his lumbar spine problem I do not feel that he is ready turned back to work at this time.  He will follow-up with Dr. Otelia SergeantNitka on May 27, 2017 for recheck.  Will let Dr. Otelia SergeantNitka decide at that time as to whether or not further imaging studies are indicated.

## 2017-05-27 ENCOUNTER — Telehealth (INDEPENDENT_AMBULATORY_CARE_PROVIDER_SITE_OTHER): Payer: Self-pay | Admitting: Specialist

## 2017-05-27 NOTE — Telephone Encounter (Signed)
Please ask Fayrene FearingJames about this patient, he was the one that saw him.  Thanks

## 2017-05-27 NOTE — Telephone Encounter (Signed)
Patient called asked if he can be released to return to work. Also, patient need a note stating he can return to work without any restrictions. The number to contact patient is [phone]

## 2017-05-27 NOTE — Telephone Encounter (Signed)
Please advise 

## 2017-05-31 ENCOUNTER — Encounter (INDEPENDENT_AMBULATORY_CARE_PROVIDER_SITE_OTHER): Payer: Self-pay | Admitting: Specialist

## 2017-05-31 ENCOUNTER — Ambulatory Visit (INDEPENDENT_AMBULATORY_CARE_PROVIDER_SITE_OTHER): Payer: BLUE CROSS/BLUE SHIELD | Admitting: Specialist

## 2017-05-31 VITALS — BP 139/94 | HR 90 | Ht 72.0 in | Wt 230.0 lb

## 2017-05-31 DIAGNOSIS — M5136 Other intervertebral disc degeneration, lumbar region: Secondary | ICD-10-CM | POA: Diagnosis not present

## 2017-05-31 DIAGNOSIS — S300XXD Contusion of lower back and pelvis, subsequent encounter: Secondary | ICD-10-CM

## 2017-05-31 MED ORDER — ACETAMINOPHEN-CODEINE #3 300-30 MG PO TABS: 1.0000 | ORAL_TABLET | Freq: Three times a day (TID) | ORAL | 0 refills | Status: DC | PRN

## 2017-05-31 NOTE — Patient Instructions (Addendum)
Avoid frequent bending and stooping  No lifting greater than 50 lbs. May use ice or moist heat for pain. Weight loss is of benefit. Alla Feeling. [employer] belt.

## 2017-05-31 NOTE — Telephone Encounter (Signed)
Pt called looking for his return to work note seen by Fayrene FearingJames

## 2017-05-31 NOTE — Addendum Note (Signed)
Addended by: Vira BrownsNITKA, Myrna Vonseggern on: 05/31/2017 03:35 PM   Modules accepted: Orders
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
## 2017-06-28 ENCOUNTER — Other Ambulatory Visit (INDEPENDENT_AMBULATORY_CARE_PROVIDER_SITE_OTHER): Payer: Self-pay | Admitting: Specialist

## 2017-06-28 NOTE — Telephone Encounter (Signed)
Med refill  [facility] [address]  Acetaminophen-codeine(Tylenol #3)300-30 mg tablet

## 2017-06-29 ENCOUNTER — Telehealth (INDEPENDENT_AMBULATORY_CARE_PROVIDER_SITE_OTHER): Payer: Self-pay | Admitting: Specialist

## 2017-06-29 MED ORDER — ACETAMINOPHEN-CODEINE #3 300-30 MG PO TABS: 1.0000 | ORAL_TABLET | Freq: Three times a day (TID) | ORAL | 0 refills | Status: DC | PRN

## 2017-06-29 NOTE — Telephone Encounter (Signed)
This is a duplicate request, has been sent to Dr. Otelia Sergeant, patient also has appt on 06/30/17

## 2017-06-29 NOTE — Telephone Encounter (Signed)
Patient called asking for a refill on his tylenol 3's. CB # [phone]

## 2017-06-30 ENCOUNTER — Ambulatory Visit (INDEPENDENT_AMBULATORY_CARE_PROVIDER_SITE_OTHER): Payer: BLUE CROSS/BLUE SHIELD | Admitting: Specialist

## 2017-06-30 NOTE — Telephone Encounter (Signed)
rx will be given to patient at his appt today

## 2017-08-11 ENCOUNTER — Other Ambulatory Visit (INDEPENDENT_AMBULATORY_CARE_PROVIDER_SITE_OTHER): Payer: Self-pay | Admitting: Specialist

## 2017-08-11 MED ORDER — ACETAMINOPHEN-CODEINE #3 300-30 MG PO TABS: 1.0000 | ORAL_TABLET | Freq: Three times a day (TID) | ORAL | 0 refills | Status: DC | PRN

## 2017-08-11 NOTE — Telephone Encounter (Signed)
Patient requesting rx refill of Tylenol # [facility] [address] Patients # [phone]

## 2017-08-11 NOTE — Telephone Encounter (Signed)
[facility], I advised pt this has been done.

## 2017-09-22 ENCOUNTER — Other Ambulatory Visit (INDEPENDENT_AMBULATORY_CARE_PROVIDER_SITE_OTHER): Payer: Self-pay | Admitting: Specialist

## 2017-09-22 NOTE — Telephone Encounter (Signed)
Patient requesting RX refill on tylenol #3. [facility] [address] Patients # [phone]

## 2017-09-23 MED ORDER — ACETAMINOPHEN-CODEINE #3 300-30 MG PO TABS: 1.0000 | ORAL_TABLET | Freq: Three times a day (TID) | ORAL | 0 refills | Status: DC | PRN

## 2017-09-23 NOTE — Telephone Encounter (Signed)
[facility] 

## 2017-10-06 ENCOUNTER — Telehealth (INDEPENDENT_AMBULATORY_CARE_PROVIDER_SITE_OTHER): Payer: Self-pay | Admitting: Specialist

## 2017-10-06 NOTE — Telephone Encounter (Signed)
Patient called and said he now has [facility] (I put it in the system) he said he would like another order put in for a MRI of his spine. He said since last time his back has gotten much worse. Please call patient once order is put in # [phone]

## 2017-10-07 NOTE — Telephone Encounter (Signed)
Patient called and said he now has [facility] (I put it in the system) he said he would like another order put in for a MRI of his spine. He said since last time his back has gotten much worse. ----Please advise

## 2017-10-15 ENCOUNTER — Other Ambulatory Visit (INDEPENDENT_AMBULATORY_CARE_PROVIDER_SITE_OTHER): Payer: Self-pay | Admitting: Specialist

## 2017-10-15 MED ORDER — ACETAMINOPHEN-CODEINE #3 300-30 MG PO TABS: 1.0000 | ORAL_TABLET | Freq: Three times a day (TID) | ORAL | 0 refills | Status: DC | PRN

## 2017-10-15 NOTE — Telephone Encounter (Signed)
Sent request to Dr. Ophelia Charter

## 2017-10-15 NOTE — Telephone Encounter (Signed)
Last visit was almost 5 months ago and MRI was not ordered then. MRIs are costly and most insurers will not pay for them with out an exam or a reason that is different than that seen at the last visit. I recommend and appointment and if MRI is indicated I will order it. He may not think it is a concern but physicians are rated on their ability to treat patients Not their ability to order MRIs. jen

## 2017-10-15 NOTE — Telephone Encounter (Signed)
Ok refill thanks 

## 2017-10-15 NOTE — Telephone Encounter (Signed)
Patient called needing Rx refilled (Tylenol 3) The number to contact patient is [phone]

## 2017-10-18 NOTE — Telephone Encounter (Signed)
Patient has been advised and he has been scheduled for 10/21/17 @ 415, needs early appt or last as possible for work due to his job.

## 2017-10-21 ENCOUNTER — Ambulatory Visit (INDEPENDENT_AMBULATORY_CARE_PROVIDER_SITE_OTHER): Payer: 59 | Admitting: Surgery

## 2017-10-21 ENCOUNTER — Encounter (INDEPENDENT_AMBULATORY_CARE_PROVIDER_SITE_OTHER): Payer: Self-pay | Admitting: Surgery

## 2017-10-21 VITALS — BP 126/82 | HR 96 | Ht 72.0 in | Wt 215.0 lb

## 2017-10-21 DIAGNOSIS — M4316 Spondylolisthesis, lumbar region: Secondary | ICD-10-CM | POA: Diagnosis not present

## 2017-10-21 DIAGNOSIS — M5441 Lumbago with sciatica, right side: Secondary | ICD-10-CM

## 2017-10-21 DIAGNOSIS — M5442 Lumbago with sciatica, left side: Secondary | ICD-10-CM

## 2017-10-21 NOTE — Progress Notes (Signed)
Office Visit Note   Patient: Joseph Vazquez           Date of Birth: [birth year]           MRN: [id number] Visit Date: 10/21/2017              Requested by: No referring provider defined for this encounter. PCP: Patient, No Pcp Per   Assessment & Plan: Visit Diagnoses:  1. Spondylolisthesis of lumbar region   2. Bilateral low back pain with bilateral sciatica, unspecified chronicity     Plan: With patient's progressively worsening symptoms will schedule lumbar MRI to rule out HNP/stenosis as I previously ordered January 2019.  He has failed conservative treatment up to this point.  Worsening symptoms is having a significant negative impact on his quality of life.  Follow-up with Dr. Otelia SergeantNitka in a couple weeks for recheck to review results and discuss treatment options.  Patient requested a pain medication stronger than Tylenol 3 but this was not given today.  He states that he is not wanting to have surgery.  I have recommended that he proceed with getting the MRI scan so that way Dr. Otelia SergeantNitka can at least give him his options and make sure that he is not causing any permanent damage.  He voices understanding.  If he does have a surgical problem and elects not to have anything done we should consider sending him to a pain clinic.    Follow-Up Instructions: Return in about 2 weeks (around 11/04/2017) for With Dr. Otelia SergeantNitka to review lumbar MRI.   Orders:  Orders Placed This Encounter  Procedures  . MR Lumbar Spine w/o contrast   No orders of the defined types were placed in this encounter.     Procedures: No procedures performed   Clinical Data: No additional findings.   Subjective: Chief Complaint  Patient presents with  . Lower Back - Pain    HPI 27 year old white male returns with complaints of chronic low back pain and bilateral lower extremity radiculopathy.  He does have a known L4 retrolisthesis.  States that his symptoms are progressively getting worse.  He was unable to have  MRI lumbar spine that I ordered January 2019 due to him losing his insurance.  States that he would like to proceed with having it now.  Denies bowel or bladder incontinence.  Back pain is constant with pain radiating down to bilateral knees and feet.  Does feel like his legs give out on him.  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 Review of Systems No current cardiac pulmonary GI GU issues  Objective: Vital Signs: BP 126/82   Pulse 96   Ht 6' (1.829 m)   Wt 215 lb (97.5 kg)   BMI 29.16 kg/m   Physical Exam  Constitutional: He is oriented to person, place, and time. No distress.  HENT:  Head: Normocephalic and atraumatic.  Eyes: Pupils are equal, round, and reactive to light. EOM are normal.  Pulmonary/Chest: No respiratory distress.  Musculoskeletal:  Pain with lumbar extension and flexion.  Positive bilateral sciatic notch tenderness.  Positive lumbar paraspinal tenderness.  Negative logroll.  Negative bilateral straight leg raise.  Question bilateral quad weakness.  Calves nontender.  Neurological: He is alert and oriented to person, place, and time.  Skin: Skin is warm and dry.    Ortho Exam  Specialty Comments:  No specialty comments available.  Imaging: No results found.   PMFS  History: Patient Active Problem List   Diagnosis Date Noted  . Right knee pain 07/05/2012  . Nicotine addiction 12/01/2011  . GERD 09/17/2008  . OTHER SEBORRHEIC DERMATITIS 09/17/2008  . HYPERHIDROSIS 09/17/2008   History reviewed. No pertinent past medical history.  History reviewed. No pertinent family history.  History reviewed. No pertinent surgical history. Social History   Occupational History  . Not on file  Tobacco Use  . Smoking status: Current Every Day Smoker    Packs/day: 0.05    Types: Cigarettes  . Smokeless tobacco: Current User  Substance and Sexual Activity  . Alcohol use: Yes    Comment: Socially  . Drug use: Yes     Types: Marijuana  . Sexual activity: Yes

## 2017-10-24 ENCOUNTER — Ambulatory Visit
Admission: RE | Admit: 2017-10-24 | Discharge: 2017-10-24 | Disposition: A | Discharge status: Home / Self Care | Payer: BLUE CROSS/BLUE SHIELD | Source: Ambulatory Visit | Attending: Surgery | Admitting: Surgery

## 2017-10-24 DIAGNOSIS — M48061 Spinal stenosis, lumbar region without neurogenic claudication: Secondary | ICD-10-CM | POA: Diagnosis not present

## 2017-10-24 DIAGNOSIS — M4316 Spondylolisthesis, lumbar region: Secondary | ICD-10-CM

## 2017-11-12 ENCOUNTER — Encounter (INDEPENDENT_AMBULATORY_CARE_PROVIDER_SITE_OTHER): Payer: Self-pay | Admitting: Specialist

## 2017-11-12 ENCOUNTER — Ambulatory Visit (INDEPENDENT_AMBULATORY_CARE_PROVIDER_SITE_OTHER): Payer: 59 | Admitting: Specialist

## 2017-11-12 VITALS — BP 138/87 | HR 77 | Ht 72.0 in | Wt 216.0 lb

## 2017-11-12 DIAGNOSIS — M4200 Juvenile osteochondrosis of spine, site unspecified: Secondary | ICD-10-CM | POA: Diagnosis not present

## 2017-11-12 DIAGNOSIS — M5136 Other intervertebral disc degeneration, lumbar region: Secondary | ICD-10-CM

## 2017-11-12 DIAGNOSIS — M48062 Spinal stenosis, lumbar region with neurogenic claudication: Secondary | ICD-10-CM

## 2017-11-12 DIAGNOSIS — M5126 Other intervertebral disc displacement, lumbar region: Secondary | ICD-10-CM

## 2017-11-12 MED ORDER — ACETAMINOPHEN-CODEINE #3 300-30 MG PO TABS: 1.0000 | ORAL_TABLET | Freq: Three times a day (TID) | ORAL | 0 refills | Status: DC | PRN

## 2017-11-12 MED ORDER — CELECOXIB 200 MG PO CAPS
200.0000 mg | ORAL_CAPSULE | Freq: Two times a day (BID) | ORAL | 6 refills | Status: DC
Start: 2017-11-12 — End: 2018-08-07

## 2017-11-12 MED ORDER — PREGABALIN 75 MG PO CAPS: 75.0000 mg | ORAL_CAPSULE | Freq: Two times a day (BID) | ORAL | 1 refills | Status: DC
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
## 2017-11-12 NOTE — Patient Instructions (Addendum)
Avoid frequent bending and stooping  No lifting greater than 10 lbs. May use ice or moist heat for pain. Weight loss is of benefit. Best medication for lumbar disc disease is arthritis medications like motrin, celebrex and naprosyn. Exercise is important to improve your indurance and does allow people to function better inspite of back pain.  Start celebrex 200 mg up to BID with meal or snack Start lyrica for nerve pain 75 mg BID. Try CBD capsules  [employer]  6000 mg capsules.

## 2017-12-06 DIAGNOSIS — M5145 Schmorl's nodes, thoracolumbar region: Secondary | ICD-10-CM | POA: Diagnosis not present

## 2017-12-06 DIAGNOSIS — M9902 Segmental and somatic dysfunction of thoracic region: Secondary | ICD-10-CM | POA: Diagnosis not present

## 2017-12-06 DIAGNOSIS — M9905 Segmental and somatic dysfunction of pelvic region: Secondary | ICD-10-CM | POA: Diagnosis not present

## 2017-12-08 DIAGNOSIS — M9905 Segmental and somatic dysfunction of pelvic region: Secondary | ICD-10-CM | POA: Diagnosis not present

## 2017-12-08 DIAGNOSIS — M9902 Segmental and somatic dysfunction of thoracic region: Secondary | ICD-10-CM | POA: Diagnosis not present

## 2017-12-08 DIAGNOSIS — M5145 Schmorl's nodes, thoracolumbar region: Secondary | ICD-10-CM | POA: Diagnosis not present

## 2017-12-09 DIAGNOSIS — M5145 Schmorl's nodes, thoracolumbar region: Secondary | ICD-10-CM | POA: Diagnosis not present

## 2017-12-09 DIAGNOSIS — M9905 Segmental and somatic dysfunction of pelvic region: Secondary | ICD-10-CM | POA: Diagnosis not present

## 2017-12-09 DIAGNOSIS — M9902 Segmental and somatic dysfunction of thoracic region: Secondary | ICD-10-CM | POA: Diagnosis not present

## 2017-12-14 ENCOUNTER — Other Ambulatory Visit (INDEPENDENT_AMBULATORY_CARE_PROVIDER_SITE_OTHER): Payer: Self-pay | Admitting: Specialist

## 2017-12-14 NOTE — Telephone Encounter (Signed)
Tylenol refill request

## 2017-12-15 NOTE — Telephone Encounter (Signed)
[facility] 

## 2018-01-18 DIAGNOSIS — M5136 Other intervertebral disc degeneration, lumbar region: Secondary | ICD-10-CM | POA: Diagnosis not present

## 2018-03-03 ENCOUNTER — Other Ambulatory Visit (INDEPENDENT_AMBULATORY_CARE_PROVIDER_SITE_OTHER): Payer: Self-pay | Admitting: Specialist

## 2018-03-03 NOTE — Telephone Encounter (Signed)
Tylenol #3 refill request 

## 2018-03-11 DIAGNOSIS — M545 Low back pain: Secondary | ICD-10-CM | POA: Diagnosis not present

## 2018-03-11 DIAGNOSIS — M5126 Other intervertebral disc displacement, lumbar region: Secondary | ICD-10-CM | POA: Diagnosis not present

## 2018-03-11 DIAGNOSIS — M5127 Other intervertebral disc displacement, lumbosacral region: Secondary | ICD-10-CM | POA: Diagnosis not present

## 2018-03-17 DIAGNOSIS — Z23 Encounter for immunization: Secondary | ICD-10-CM | POA: Diagnosis not present

## 2018-05-17 ENCOUNTER — Telehealth (INDEPENDENT_AMBULATORY_CARE_PROVIDER_SITE_OTHER): Payer: Self-pay | Admitting: *Deleted

## 2018-05-17 NOTE — Telephone Encounter (Signed)
Called pt to ask pre-screening covid-19 questions and he states he will like to cancel appt at this time.

## 2018-05-18 ENCOUNTER — Ambulatory Visit (INDEPENDENT_AMBULATORY_CARE_PROVIDER_SITE_OTHER): Payer: 59 | Admitting: Specialist

## 2018-06-21 DIAGNOSIS — K4090 Unilateral inguinal hernia, without obstruction or gangrene, not specified as recurrent: Secondary | ICD-10-CM | POA: Diagnosis not present

## 2018-06-21 DIAGNOSIS — F17210 Nicotine dependence, cigarettes, uncomplicated: Secondary | ICD-10-CM | POA: Diagnosis not present

## 2018-07-13 ENCOUNTER — Ambulatory Visit: Payer: Self-pay | Admitting: Surgery

## 2018-07-13 NOTE — H&P (Signed)
Arlyce Harman Documented: 07/13/2018 3:24 PM Location: [facility] Patient #: [id number] DOB: 06/23/1990 Undefined / Language: [place] / Race: White Male  History of Present Illness Ardeth Sportsman MD; 07/13/2018 4:06 PM) The patient is a 28 year old male who presents for an evaluation of a hernia. Note for "Hernia": ` ` ` Patient sent for surgical consultation at the request of Dr Deatra James  Chief Complaint: Pain and possible hernia ` ` The patient is a had of Young male who noticed some bulging and discomfort in his left groin. Persisted. He felt a lump at his bellybutton as well. Was concerned. Evaluated by Dr. Wynelle Link with [facility] physicians. Inguinal hernia suspected on the left side. Surgical consultation requested. Patient's not had any prior surgery. He does have a history of some low back pain but that is stable and mild appeared resolving nonoperatively. He does smoke. He moves his bowels about twice a day. He can walk an hour without difficulty. He does work in [employer] with moderately intense physical activity. He does not have any severe debilitating pain. History of rectal bleeding and required colonoscopy. Workup consistent with hemorrhoids.  (Review of systems as stated in this history (HPI) or in the review of systems. Otherwise all other 12 point ROS are negative) ` ` `   Past Surgical History Kristen Cardinal, CMA; 07/13/2018 3:24 PM) Colon Polyp Removal - Colonoscopy Oral Surgery  Diagnostic Studies History Kristen Cardinal, CMA; 07/13/2018 3:24 PM) Colonoscopy 1-5 years ago  Allergies Kristen Cardinal, CMA; 07/13/2018 3:25 PM) No Known Allergies [07/13/2018]: No Known Drug Allergies [07/13/2018]: Allergies Reconciled  Medication History Kristen Cardinal, CMA; 07/13/2018 3:25 PM) No Current Medications Medications Reconciled  Social History Kristen Cardinal, CMA; 07/13/2018 3:24 PM) Alcohol use Occasional alcohol use. Caffeine use Carbonated  beverages, Coffee, Tea. Illicit drug use Prefer to discuss with provider. Tobacco use Current every day smoker.  Family History Kristen Cardinal, [place]; 07/13/2018 3:24 PM) Bleeding disorder Brother. Cancer Mother. Hypertension Father. Migraine Headache Brother.  Other Problems Kristen Cardinal, CMA; 07/13/2018 3:24 PM) Back Pain Gastroesophageal Reflux Disease Other disease, cancer, significant illness Umbilical Hernia Repair     Review of Systems (Sabrina Canty CMA; 07/13/2018 3:24 PM) General Present- Fatigue. Not Present- Appetite Loss, Chills, Fever, Night Sweats, Weight Gain and Weight Loss. Skin Not Present- Change in Wart/Mole, Dryness, Hives, Jaundice, New Lesions, Non-Healing Wounds, Rash and Ulcer. HEENT Present- Seasonal Allergies. Not Present- Earache, Hearing Loss, Hoarseness, Nose Bleed, Oral Ulcers, Ringing in the Ears, Sinus Pain, Sore Throat, Visual Disturbances, Wears glasses/contact lenses and Yellow Eyes. Respiratory Not Present- Bloody sputum, Chronic Cough, Difficulty Breathing, Snoring and Wheezing. Breast Not Present- Breast Mass, Breast Pain, Nipple Discharge and Skin Changes. Cardiovascular Not Present- Chest Pain, Difficulty Breathing Lying Down, Leg Cramps, Palpitations, Rapid Heart Rate, Shortness of Breath and Swelling of Extremities. Gastrointestinal Present- Abdominal Pain, Constipation and Excessive gas. Not Present- Bloating, Bloody Stool, Change in Bowel Habits, Chronic diarrhea, Difficulty Swallowing, Gets full quickly at meals, Hemorrhoids, Indigestion, Nausea, Rectal Pain and Vomiting. Male Genitourinary Present- Urine Leakage. Not Present- Blood in Urine, Change in Urinary Stream, Frequency, Impotence, Nocturia, Painful Urination and Urgency. Musculoskeletal Present- Back Pain. Not Present- Joint Pain, Joint Stiffness, Muscle Pain, Muscle Weakness and Swelling of Extremities. Psychiatric Present- Anxiety and Change in Sleep Pattern. Not Present-  Bipolar, Depression, Fearful and Frequent crying. Endocrine Not Present- Cold Intolerance, Excessive Hunger, Hair Changes, Heat Intolerance, Hot flashes and New Diabetes. Hematology Not Present- Blood Thinners, Easy Bruising, Excessive  bleeding, Gland problems, HIV and Persistent Infections.  Vitals (Sabrina Canty CMA; 07/13/2018 3:25 PM) 07/13/2018 3:25 PM Weight: 202.8 lb Height: 72in Body Surface Area: 2.14 m Body Mass Index: 27.5 kg/m  Temp.: 96.3F(Temporal)  Pulse: 101 (Regular)  BP: 164/94 (Sitting, Left Arm, Standard)      Physical Exam Ardeth Sportsman MD; 07/13/2018 3:48 PM)  General Mental Status-Alert. General Appearance-Not in acute distress, Not Sickly. Orientation-Oriented X3. Hydration-Well hydrated. Voice-Normal.  Integumentary Global Assessment Upon inspection and palpation of skin surfaces of the - Axillae: non-tender, no inflammation or ulceration, no drainage. and Distribution of scalp and body hair is normal. General Characteristics Temperature - normal warmth is noted.  Head and Neck Head-normocephalic, atraumatic with no lesions or palpable masses. Face Global Assessment - atraumatic, no absence of expression. Neck Global Assessment - no abnormal movements, no bruit auscultated on the right, no bruit auscultated on the left, no decreased range of motion, non-tender. Trachea-midline. Thyroid Gland Characteristics - non-tender.  Eye Eyeball - Left-Extraocular movements intact, No Nystagmus. Eyeball - Right-Extraocular movements intact, No Nystagmus. Cornea - Left-No Hazy. Cornea - Right-No Hazy. Sclera/Conjunctiva - Left-No scleral icterus, No Discharge. Sclera/Conjunctiva - Right-No scleral icterus, No Discharge. Pupil - Left-Direct reaction to light normal. Pupil - Right-Direct reaction to light normal.  ENMT Ears Pinna - Left - no drainage observed, no generalized tenderness observed. Right - no  drainage observed, no generalized tenderness observed. Nose and Sinuses External Inspection of the Nose - no destructive lesion observed. Inspection of the nares - Left - quiet respiration. Right - quiet respiration. Mouth and Throat Lips - Upper Lip - no fissures observed, no pallor noted. Lower Lip - no fissures observed, no pallor noted. Nasopharynx - no discharge present. Oral Cavity/Oropharynx - Tongue - no dryness observed. Oral Mucosa - no cyanosis observed. Hypopharynx - no evidence of airway distress observed.  Chest and Lung Exam Inspection Movements - Normal and Symmetrical. Accessory muscles - No use of accessory muscles in breathing. Palpation Palpation of the chest reveals - Non-tender. Auscultation Breath sounds - Normal and Clear.  Cardiovascular Auscultation Rhythm - Regular. Murmurs & Other Heart Sounds - Auscultation of the heart reveals - No Murmurs and No Systolic Clicks.  Abdomen Inspection Inspection of the abdomen reveals - No Visible peristalsis and No Abnormal pulsations. Umbilicus - No Bleeding, No Urine drainage. Palpation/Percussion Palpation and Percussion of the abdomen reveal - Soft, Non Tender, No Rebound tenderness, No Rigidity (guarding) and No Cutaneous hyperesthesia. Note: Abdomen soft. Nontender. Not distended. 2 cm supraumbilical mass comes down to the umbilical stalk suspicious for chronically incarcerated umbilical hernia. Not inflamed nor tender. No diastases recti. Not obese.  Male Genitourinary Sexual Maturity Tanner 5 - Adult hair pattern and Adult penile size and shape. Note: Definite left inguinal hernia. Impulse in right groin suspicious for a contralateral inguinal hernia as well. Otherwise circumcised normal external male genitalia.  Peripheral Vascular Upper Extremity Inspection - Left - No Cyanotic nailbeds, Not Ischemic. Right - No Cyanotic nailbeds, Not Ischemic.  Neurologic Neurologic evaluation reveals -normal attention  span and ability to concentrate, able to name objects and repeat phrases. Appropriate fund of knowledge , normal sensation and normal coordination. Mental Status Affect - not angry, not paranoid. Cranial Nerves-Normal Bilaterally. Gait-Normal.  Neuropsychiatric Mental status exam performed with findings of-able to articulate well with normal speech/language, rate, volume and coherence, thought content normal with ability to perform basic computations and apply abstract reasoning and no evidence of hallucinations, delusions, obsessions or homicidal/suicidal  ideation.  Musculoskeletal Global Assessment Spine, Ribs and Pelvis - no instability, subluxation or laxity. Right Upper Extremity - no instability, subluxation or laxity.  Lymphatic Head & Neck  General Head & Neck Lymphatics: Bilateral - Description - No Localized lymphadenopathy. Axillary  General Axillary Region: Bilateral - Description - No Localized lymphadenopathy. Femoral & Inguinal  Generalized Femoral & Inguinal Lymphatics: Left - Description - No Localized lymphadenopathy. Right - Description - No Localized lymphadenopathy.    Assessment & Plan Ardeth Sportsman(Ott Zimmerle C. Mel Langan MD; 07/13/2018 4:07 PM)  LEFT INGUINAL HERNIA (K40.90) Impression: Definite left inguinal hernia. Seems rather medial like a direct space hernia. Subtle impulse on the right side concerning for possible small hernia there as well. I think he would benefit from laparoscopic exploration and repair of hernias found. I suspect he might have a small incarcerated umbilical hernia. That is probably small enough that we can reduce and primarily repair.  He works in [employer] with moderately intense activity. He's not horribly symptomatic at this time. He is leaning toward surgery but wishes to think about things since this is a busy time at work and he doesn't want to take him off right now. I did caution them he will least need 2 weeks before even thinking returning  to light duty, 6 weeks for unrestricted duty. I tried to reassure him that we would help to pathology for to help protect him from work if needed to make sure he recovers safely   PREOP - ING HERNIA - ENCOUNTER FOR PREOPERATIVE EXAMINATION FOR GENERAL SURGICAL PROCEDURE (Z01.818)  Current Plans You are being scheduled for surgery- Our schedulers will call you.  You should hear from our office's scheduling department within 5 working days about the location, date, and time of surgery. We try to make accommodations for patient's preferences in scheduling surgery, but sometimes the OR schedule or the surgeon's schedule prevents us from making those accommodations.  If you have not heard from our office [phone]) in 5 working days, call the office and ask for your surgeon's nurse.  If you have other questions about your diagnosis, plan, or surgery, call the office and ask for your surgeon's nurse.  Written instructions provided The anatomy & physiology of the abdominal wall and pelvic floor was discussed. The pathophysiology of hernias in the inguinal and pelvic region was discussed. Natural history risks such as progressive enlargement, pain, incarceration, and strangulation was discussed. Contributors to complications such as smoking, obesity, diabetes, prior surgery, etc were discussed.  I feel the risks of no intervention will lead to serious problems that outweigh the operative risks; therefore, I recommended surgery to reduce and repair the hernia. I explained laparoscopic techniques with possible need for an open approach. I noted usual use of mesh to patch and/or buttress hernia repair  Risks such as bleeding, infection, abscess, need for further treatment, heart attack, death, and other risks were discussed. I noted a good likelihood this will help address the problem. Goals of post-operative recovery were discussed as well. Possibility that this will not correct all  symptoms was explained. I stressed the importance of low-impact activity, aggressive pain control, avoiding constipation, & not pushing through pain to minimize risk of post-operative chronic pain or injury. Possibility of reherniation was discussed. We will work to minimize complications.  An educational handout further explaining the pathology & treatment options was given as well. Questions were answered. The patient expresses understanding & wishes to proceed with surgery.  Pt Education - Pamphlet Given - Laparoscopic  Hernia Repair: discussed with patient and provided information. Pt Education - [facility] Pain Control (Sarvesh Meddaugh) Pt Education - [facility] Hernia Post-Op HCI (Zarria Towell): discussed with patient and provided information. Pt Education - [facility] Mesh education: discussed with patient and provided information.  INCARCERATED UMBILICAL HERNIA (K42.0) Impression: Small subcutaneous mass just above the umbilical stalk suspicious for incarcerated umbilical hernia. We'll plan open exploration with probable primary repair.  Current Plans The anatomy & physiology of the abdominal wall was discussed. The pathophysiology of hernias was discussed. Natural history risks without surgery including progeressive enlargement, pain, incarceration, & strangulation was discussed. Contributors to complications such as smoking, obesity, diabetes, prior surgery, etc were discussed.  I feel the risks of no intervention will lead to serious problems that outweigh the operative risks; therefore, I recommended surgery to reduce and repair the hernia. I explained laparoscopic techniques with possible need for an open approach. I noted the probable use of mesh to patch and/or buttress the hernia repair  Risks such as bleeding, infection, abscess, need for further treatment, heart attack, death, and other risks were discussed. I noted a good likelihood this will help address the problem. Goals of post-operative recovery  were discussed as well. Possibility that this will not correct all symptoms was explained. I stressed the importance of low-impact activity, aggressive pain control, avoiding constipation, & not pushing through pain to minimize risk of post-operative chronic pain or injury. Possibility of reherniation especially with smoking, obesity, diabetes, immunosuppression, and other health conditions was discussed. We will work to minimize complications.  An educational handout further explaining the pathology & treatment options was given as well. Questions were answered. The patient expresses understanding & wishes to proceed with surgery.  Ardeth Sportsman, MD, FACS, MASCRS Gastrointestinal and Minimally Invasive Surgery    [address] [place], [place] [address] [phone] Main / Paging [phone] Fax

## 2018-08-06 ENCOUNTER — Encounter (HOSPITAL_COMMUNITY): Payer: Self-pay | Admitting: Emergency Medicine

## 2018-08-06 ENCOUNTER — Other Ambulatory Visit: Payer: Self-pay

## 2018-08-06 DIAGNOSIS — F17200 Nicotine dependence, unspecified, uncomplicated: Secondary | ICD-10-CM | POA: Diagnosis not present

## 2018-08-06 DIAGNOSIS — K219 Gastro-esophageal reflux disease without esophagitis: Secondary | ICD-10-CM | POA: Diagnosis not present

## 2018-08-06 DIAGNOSIS — L03311 Cellulitis of abdominal wall: Secondary | ICD-10-CM | POA: Diagnosis not present

## 2018-08-06 DIAGNOSIS — K420 Umbilical hernia with obstruction, without gangrene: Secondary | ICD-10-CM | POA: Diagnosis not present

## 2018-08-06 DIAGNOSIS — Z1159 Encounter for screening for other viral diseases: Secondary | ICD-10-CM | POA: Insufficient documentation

## 2018-08-06 NOTE — ED Triage Notes (Signed)
Patient reports he has three hernias at this time and surgery scheduled in August. States umbilical hernia has worsening pain x2 days. Denies changes in bowel. Reports nausea, denies vomiting.

## 2018-08-07 ENCOUNTER — Emergency Department (HOSPITAL_COMMUNITY)
Admission: EM | Admit: 2018-08-07 | Discharge: 2018-08-07 | Disposition: A | Discharge status: Home / Self Care | Payer: 59 | Attending: Surgery | Admitting: Surgery

## 2018-08-07 ENCOUNTER — Emergency Department (HOSPITAL_COMMUNITY): Payer: 59 | Admitting: Anesthesiology

## 2018-08-07 ENCOUNTER — Encounter (HOSPITAL_COMMUNITY)
Admission: EM | Disposition: A | Discharge status: Home / Self Care | Payer: Self-pay | Source: Home / Self Care | Attending: Emergency Medicine

## 2018-08-07 ENCOUNTER — Emergency Department (HOSPITAL_COMMUNITY): Payer: 59

## 2018-08-07 ENCOUNTER — Encounter (HOSPITAL_COMMUNITY): Payer: Self-pay

## 2018-08-07 DIAGNOSIS — K420 Umbilical hernia with obstruction, without gangrene: Secondary | ICD-10-CM

## 2018-08-07 HISTORY — PX: VENTRAL HERNIA REPAIR: SHX424

## 2018-08-07 LAB — CBC WITH DIFFERENTIAL/PLATELET
Abs Immature Granulocytes: 0.02 10*3/uL (ref 0.00–0.07)
Basophils Absolute: 0.1 10*3/uL (ref 0.0–0.1)
Basophils Relative: 1 %
Eosinophils Absolute: 0.3 10*3/uL (ref 0.0–0.5)
Eosinophils Relative: 3 %
HCT: 38.3 % — ABNORMAL LOW (ref 39.0–52.0)
Hemoglobin: 13 g/dL (ref 13.0–17.0)
Immature Granulocytes: 0 %
Lymphocytes Relative: 34 %
Lymphs Abs: 3.3 10*3/uL (ref 0.7–4.0)
MCH: 32.1 pg (ref 26.0–34.0)
MCHC: 33.9 g/dL (ref 30.0–36.0)
MCV: 94.6 fL (ref 80.0–100.0)
Monocytes Absolute: 0.6 10*3/uL (ref 0.1–1.0)
Monocytes Relative: 7 %
Neutro Abs: 5.4 10*3/uL (ref 1.7–7.7)
Neutrophils Relative %: 55 %
Platelets: 274 10*3/uL (ref 150–400)
RBC: 4.05 MIL/uL — ABNORMAL LOW (ref 4.22–5.81)
RDW: 12.1 % (ref 11.5–15.5)
WBC: 9.7 10*3/uL (ref 4.0–10.5)
nRBC: 0 % (ref 0.0–0.2)

## 2018-08-07 LAB — COMPREHENSIVE METABOLIC PANEL
ALT: 19 U/L (ref 0–44)
AST: 17 U/L (ref 15–41)
Albumin: 4.1 g/dL (ref 3.5–5.0)
Alkaline Phosphatase: 63 U/L (ref 38–126)
Anion gap: 9 (ref 5–15)
BUN: 11 mg/dL (ref 6–20)
CO2: 26 mmol/L (ref 22–32)
Calcium: 8.9 mg/dL (ref 8.9–10.3)
Chloride: 106 mmol/L (ref 98–111)
Creatinine, Ser: 0.84 mg/dL (ref 0.61–1.24)
GFR calc Af Amer: >60 mL/min (ref >60)
GFR calc non Af Amer: >60 mL/min (ref >60)
Glucose, Bld: 94 mg/dL (ref 70–99)
Potassium: 3.7 mmol/L (ref 3.5–5.1)
Sodium: 141 mmol/L (ref 135–145)
Total Bilirubin: 0.6 mg/dL (ref 0.3–1.2)
Total Protein: 6.9 g/dL (ref 6.5–8.1)

## 2018-08-07 LAB — SARS CORONAVIRUS 2 BY RT PCR (HOSPITAL ORDER, PERFORMED IN ~~LOC~~ HOSPITAL LAB): SARS Coronavirus 2: NEGATIVE

## 2018-08-07 SURGERY — REPAIR, HERNIA, VENTRAL
Anesthesia: General | Site: Abdomen

## 2018-08-07 MED ORDER — FENTANYL CITRATE (PF) 100 MCG/2ML IJ SOLN
INTRAMUSCULAR | Status: DC | PRN
  Administered 2018-08-07: 100 ug via INTRAVENOUS

## 2018-08-07 MED ORDER — DOCUSATE SODIUM 100 MG PO CAPS: 100.0000 mg | ORAL_CAPSULE | Freq: Two times a day (BID) | ORAL | 0 refills | Status: AC

## 2018-08-07 MED ORDER — SODIUM CHLORIDE (PF) 0.9 % IJ SOLN
INTRAMUSCULAR | Status: AC
  Administered 2018-08-07: 10 mL
  Filled 2018-08-07: qty 50

## 2018-08-07 MED ORDER — CEFAZOLIN SODIUM-DEXTROSE 2-4 GM/100ML-% IV SOLN
INTRAVENOUS | Status: AC
  Filled 2018-08-07: qty 100

## 2018-08-07 MED ORDER — HYDROCODONE-ACETAMINOPHEN 7.5-325 MG PO TABS: 1.0000 | ORAL_TABLET | Freq: Once | ORAL | Status: DC | PRN

## 2018-08-07 MED ORDER — ONDANSETRON HCL 4 MG/2ML IJ SOLN: 4.0000 mg | Freq: Once | INTRAMUSCULAR | Status: DC | PRN

## 2018-08-07 MED ORDER — DEXAMETHASONE SODIUM PHOSPHATE 10 MG/ML IJ SOLN
INTRAMUSCULAR | Status: AC
  Filled 2018-08-07: qty 1

## 2018-08-07 MED ORDER — ONDANSETRON HCL 4 MG/2ML IJ SOLN
INTRAMUSCULAR | Status: AC
  Filled 2018-08-07: qty 2

## 2018-08-07 MED ORDER — ACETAMINOPHEN 500 MG PO TABS
1000.0000 mg | ORAL_TABLET | ORAL | Status: AC
  Administered 2018-08-07: 1000 mg via ORAL

## 2018-08-07 MED ORDER — CEFAZOLIN SODIUM-DEXTROSE 2-4 GM/100ML-% IV SOLN
2.0000 g | INTRAVENOUS | Status: AC
  Administered 2018-08-07: 2 g via INTRAVENOUS

## 2018-08-07 MED ORDER — GABAPENTIN 300 MG PO CAPS
ORAL_CAPSULE | ORAL | Status: AC
  Filled 2018-08-07: qty 1

## 2018-08-07 MED ORDER — PROPOFOL 10 MG/ML IV BOLUS
INTRAVENOUS | Status: AC
  Filled 2018-08-07: qty 20

## 2018-08-07 MED ORDER — MORPHINE SULFATE (PF) 4 MG/ML IV SOLN
4.0000 mg | Freq: Once | INTRAVENOUS | Status: AC
  Administered 2018-08-07: 4 mg via INTRAVENOUS
  Filled 2018-08-07: qty 1

## 2018-08-07 MED ORDER — LIDOCAINE 2% (20 MG/ML) 5 ML SYRINGE
INTRAMUSCULAR | Status: DC | PRN
  Administered 2018-08-07: 100 mg via INTRAVENOUS

## 2018-08-07 MED ORDER — CHLORHEXIDINE GLUCONATE CLOTH 2 % EX PADS: 6.0000 | MEDICATED_PAD | Freq: Once | CUTANEOUS | Status: DC

## 2018-08-07 MED ORDER — LIDOCAINE 2% (20 MG/ML) 5 ML SYRINGE
INTRAMUSCULAR | Status: AC
  Filled 2018-08-07: qty 5

## 2018-08-07 MED ORDER — IOHEXOL 300 MG/ML  SOLN
100.0000 mL | Freq: Once | INTRAMUSCULAR | Status: AC | PRN
  Administered 2018-08-07: 100 mL via INTRAVENOUS

## 2018-08-07 MED ORDER — MIDAZOLAM HCL 2 MG/2ML IJ SOLN
INTRAMUSCULAR | Status: AC
  Filled 2018-08-07: qty 2

## 2018-08-07 MED ORDER — GABAPENTIN 300 MG PO CAPS
300.0000 mg | ORAL_CAPSULE | ORAL | Status: AC
  Administered 2018-08-07: 300 mg via ORAL

## 2018-08-07 MED ORDER — BUPIVACAINE-EPINEPHRINE 0.25% -1:200000 IJ SOLN
INTRAMUSCULAR | Status: DC | PRN
  Administered 2018-08-07: 7 mL

## 2018-08-07 MED ORDER — FENTANYL CITRATE (PF) 100 MCG/2ML IJ SOLN
INTRAMUSCULAR | Status: AC
  Filled 2018-08-07: qty 2

## 2018-08-07 MED ORDER — MIDAZOLAM HCL 5 MG/5ML IJ SOLN
INTRAMUSCULAR | Status: DC | PRN
  Administered 2018-08-07: 2 mg via INTRAVENOUS

## 2018-08-07 MED ORDER — ONDANSETRON HCL 4 MG/2ML IJ SOLN
INTRAMUSCULAR | Status: DC | PRN
  Administered 2018-08-07: 4 mg via INTRAVENOUS

## 2018-08-07 MED ORDER — MEPERIDINE HCL 50 MG/ML IJ SOLN: 6.2500 mg | INTRAMUSCULAR | Status: DC | PRN

## 2018-08-07 MED ORDER — CEPHALEXIN 500 MG PO CAPS: 500.0000 mg | ORAL_CAPSULE | Freq: Four times a day (QID) | ORAL | 0 refills | Status: AC

## 2018-08-07 MED ORDER — ACETAMINOPHEN 500 MG PO TABS
ORAL_TABLET | ORAL | Status: AC
  Filled 2018-08-07: qty 2

## 2018-08-07 MED ORDER — PROPOFOL 10 MG/ML IV BOLUS
INTRAVENOUS | Status: DC | PRN
  Administered 2018-08-07: 180 mg via INTRAVENOUS

## 2018-08-07 MED ORDER — DEXAMETHASONE SODIUM PHOSPHATE 10 MG/ML IJ SOLN
INTRAMUSCULAR | Status: DC | PRN
  Administered 2018-08-07: 10 mg via INTRAVENOUS

## 2018-08-07 MED ORDER — BUPIVACAINE-EPINEPHRINE (PF) 0.25% -1:200000 IJ SOLN
INTRAMUSCULAR | Status: AC
  Filled 2018-08-07: qty 30

## 2018-08-07 MED ORDER — ONDANSETRON HCL 4 MG/2ML IJ SOLN
4.0000 mg | Freq: Once | INTRAMUSCULAR | Status: AC | PRN
  Administered 2018-08-07: 4 mg via INTRAVENOUS
  Filled 2018-08-07: qty 2

## 2018-08-07 MED ORDER — 0.9 % SODIUM CHLORIDE (POUR BTL) OPTIME
TOPICAL | Status: DC | PRN
  Administered 2018-08-07: 1000 mL

## 2018-08-07 MED ORDER — LACTATED RINGERS IV SOLN
INTRAVENOUS | Status: DC | PRN
  Administered 2018-08-07: 11:00:00 via INTRAVENOUS

## 2018-08-07 MED ORDER — FENTANYL CITRATE (PF) 100 MCG/2ML IJ SOLN
25.0000 ug | INTRAMUSCULAR | Status: DC | PRN
  Administered 2018-08-07 (×3): 25 ug via INTRAVENOUS

## 2018-08-07 MED ORDER — OXYCODONE HCL 5 MG PO TABS: 5.0000 mg | ORAL_TABLET | Freq: Three times a day (TID) | ORAL | 0 refills | Status: AC | PRN

## 2018-08-07 SURGICAL SUPPLY — 42 items
BENZOIN TINCTURE PRP APPL 2/3 (GAUZE/BANDAGES/DRESSINGS) ×3 IMPLANT
BINDER ABDOMINAL 12 ML 46-62 (SOFTGOODS) IMPLANT
BLADE HEX COATED 2.75 (ELECTRODE) ×3 IMPLANT
CLOSURE STERI-STRIP 1/2X4 (GAUZE/BANDAGES/DRESSINGS) ×1
CLSR STERI-STRIP ANTIMIC 1/2X4 (GAUZE/BANDAGES/DRESSINGS) ×2 IMPLANT
COVER WAND RF STERILE (DRAPES) IMPLANT
DECANTER SPIKE VIAL GLASS SM (MISCELLANEOUS) IMPLANT
DRAIN CHANNEL RND F F (WOUND CARE) IMPLANT
DRAPE LAPAROSCOPIC ABDOMINAL (DRAPES) ×3 IMPLANT
ELECT REM PT RETURN 15FT ADLT (MISCELLANEOUS) ×3 IMPLANT
EVACUATOR SILICONE 100CC (DRAIN) IMPLANT
GAUZE PACKING 1/2X5YD (GAUZE/BANDAGES/DRESSINGS) ×3 IMPLANT
GAUZE SPONGE 4X4 12PLY STRL (GAUZE/BANDAGES/DRESSINGS) ×3 IMPLANT
GLOVE BIO SURGEON STRL SZ7.5 (GLOVE) ×6 IMPLANT
GLOVE BIOGEL PI IND STRL 7.0 (GLOVE) ×1 IMPLANT
GLOVE BIOGEL PI INDICATOR 7.0 (GLOVE) ×2
GOWN STRL REUS W/ TWL XL LVL3 (GOWN DISPOSABLE) ×1 IMPLANT
GOWN STRL REUS W/TWL LRG LVL3 (GOWN DISPOSABLE) ×3 IMPLANT
GOWN STRL REUS W/TWL XL LVL3 (GOWN DISPOSABLE) ×5 IMPLANT
KIT BASIN OR (CUSTOM PROCEDURE TRAY) ×3 IMPLANT
KIT TURNOVER KIT A (KITS) IMPLANT
NEEDLE HYPO 25X1 1.5 SAFETY (NEEDLE) ×3 IMPLANT
NS IRRIG 1000ML POUR BTL (IV SOLUTION) ×3 IMPLANT
PACK GENERAL/GYN (CUSTOM PROCEDURE TRAY) ×3 IMPLANT
STAPLER VISISTAT 35W (STAPLE) IMPLANT
SUT ETHIBOND NAB CT1 #1 30IN (SUTURE) ×3 IMPLANT
SUT ETHILON 3 0 PS 1 (SUTURE) IMPLANT
SUT MNCRL AB 4-0 PS2 18 (SUTURE) ×3 IMPLANT
SUT NOVA 1 T20/GS 25DT (SUTURE) IMPLANT
SUT PDS AB 1 CTX 36 (SUTURE) IMPLANT
SUT PROLENE 0 CT 1 CR/8 (SUTURE) IMPLANT
SUT SILK 3 0 (SUTURE)
SUT SILK 3-0 18XBRD TIE 12 (SUTURE) IMPLANT
SUT VIC AB 2-0 CT1 27 (SUTURE)
SUT VIC AB 2-0 CT1 27XBRD (SUTURE) IMPLANT
SUT VIC AB 3-0 SH 27 (SUTURE) ×2
SUT VIC AB 3-0 SH 27X BRD (SUTURE) ×1 IMPLANT
SUT VIC AB 3-0 SH 27XBRD (SUTURE) IMPLANT
SYR CONTROL 10ML LL (SYRINGE) ×3 IMPLANT
TAPE CLOTH SURG 4X10 WHT LF (GAUZE/BANDAGES/DRESSINGS) ×3 IMPLANT
TOWEL OR 17X26 10 PK STRL BLUE (TOWEL DISPOSABLE) ×3 IMPLANT
TRAY FOLEY MTR SLVR 16FR STAT (SET/KITS/TRAYS/PACK) IMPLANT

## 2018-08-07 NOTE — Anesthesia Procedure Notes (Signed)
Procedure Name: LMA Insertion Date/Time: 08/07/2018 11:24 AM Performed by: Priyal Musquiz D, CRNA Pre-anesthesia Checklist: Patient identified, Emergency Drugs available, Suction available and Patient being monitored Patient Re-evaluated:Patient Re-evaluated prior to induction Oxygen Delivery Method: Circle system utilized Preoxygenation: Pre-oxygenation with 100% oxygen Induction Type: IV induction LMA: LMA inserted LMA Size: 4.0 Tube type: Oral Number of attempts: 1 Placement Confirmation: positive ETCO2 and breath sounds checked- equal and bilateral Tube secured with: Tape Dental Injury: Teeth and Oropharynx as per pre-operative assessment

## 2018-08-07 NOTE — ED Notes (Signed)
Bed: WA25 Expected date:  Expected time:  Means of arrival:  Comments: 

## 2018-08-07 NOTE — Anesthesia Postprocedure Evaluation (Signed)
Anesthesia Post Note  Patient: Joseph Vazquez  Procedure(s) Performed: HERNIA REPAIR umblical (N/A Abdomen)     Patient location during evaluation: PACU Anesthesia Type: General Level of consciousness: awake and alert and oriented Pain management: pain level controlled Vital Signs Assessment: post-procedure vital signs reviewed and stable Respiratory status: spontaneous breathing, nonlabored ventilation and respiratory function stable Cardiovascular status: blood pressure returned to baseline and stable Postop Assessment: no apparent nausea or vomiting Anesthetic complications: no    Last Vitals:  Vitals:   08/07/18 0655 08/07/18 1222  BP: 122/74 (!) 124/92  Pulse: 60 60  Resp: 16 16  Temp: 36.6 C 36.4 C  SpO2: 99% 100%    Last Pain:  Vitals:   08/07/18 1245  TempSrc:   PainSc: 7                  Xoey Warmoth A.

## 2018-08-07 NOTE — ED Provider Notes (Signed)
[facility]-EMERGENCY DEPT Provider Note   CSN: [id number] Arrival date & time: 08/06/18  2122     History   Chief Complaint Chief Complaint  Patient presents with  . Hernia    HPI Lilla ShookBryan Urschel is a 28 y.o. male.     The history is provided by the patient and medical records.     28 y.o. M here with worsening abdominal pain around his umbilicus.  States he has 3 known hernias, left and right inguinal as well as umbilical.  He is scheduled for repair of these in August.  Reports over the past 2 days he has had worsening pain around his navel and area has become red and warm to the touch.  States he feels some swelling inside his navel.  He reports some difficulty with bowel movements and nausea but no vomiting.  He has not had any fevers.  No difficulty urinating.  No prior abdominal surgeries.  He did take some tylenol this morning without relief.   History reviewed. No pertinent past medical history.  Patient Active Problem List   Diagnosis Date Noted  . Right knee pain 07/05/2012  . Nicotine addiction 12/01/2011  . GERD 09/17/2008  . OTHER SEBORRHEIC DERMATITIS 09/17/2008  . HYPERHIDROSIS 09/17/2008    History reviewed. No pertinent surgical history.      Home Medications    Prior to Admission medications   Medication Sig Start Date End Date Taking? Authorizing Provider  acetaminophen-codeine (TYLENOL #3) 300-30 MG tablet TAKE 1 TABLET BY MOUTH EVERY 8 HOURS AS NEEDED FOR MODERATE PAIN 03/03/18   Kerrin ChampagneNitka, James E, MD  celecoxib (CELEBREX) 200 MG capsule Take 1 capsule (200 mg total) by mouth 2 (two) times daily. 11/12/17   Kerrin ChampagneNitka, James E, MD  doxycycline (VIBRAMYCIN) 100 MG capsule Take 1 capsule (100 mg total) by mouth 2 (two) times daily. 07/24/14   Ofilia Neaslark, Michael L, PA-C  HYDROcodone-acetaminophen (NORCO) 5-325 MG tablet Take 1 tablet by mouth every 12 (twelve) hours as needed for moderate pain. 05/13/17   Naida Sleightwens, James M, PA-C  methocarbamol (ROBAXIN)  500 MG tablet Take 1 tablet (500 mg total) by mouth every 8 (eight) hours as needed for muscle spasms. 02/15/17   Naida Sleightwens, James M, PA-C  methylPREDNISolone (MEDROL) 4 MG tablet 6 day taper to be taken as directed. 02/15/17   Naida Sleightwens, James M, PA-C  naproxen sodium (ANAPROX) 220 MG tablet Take 220 mg by mouth 2 (two) times daily with a meal.    [provider]  pregabalin (LYRICA) 75 MG capsule Take 1 capsule (75 mg total) by mouth 2 (two) times daily. 11/12/17   Kerrin ChampagneNitka, James E, MD    Family History No family history on file.  Social History Social History   Tobacco Use  . Smoking status: Current Every Day Smoker    Packs/day: 0.05    Types: Cigarettes  . Smokeless tobacco: Current User  Substance Use Topics  . Alcohol use: Yes    Comment: Socially  . Drug use: Yes    Types: Marijuana     Allergies   Patient has no known allergies.   Review of Systems Review of Systems  Gastrointestinal: Positive for abdominal pain and nausea.  All other systems reviewed and are negative.    Physical Exam Updated Vital Signs BP 117/79 (BP Location: Left Arm)   Pulse 64   Temp 98.3 F (36.8 C) (Oral)   Resp 18   Ht 6' (1.829 m)   Hartford FinancialWt  93 kg   SpO2 100%   BMI 27.80 kg/m   Physical Exam Vitals signs and nursing note reviewed.  Constitutional:      Appearance: He is well-developed.  HENT:     Head: Normocephalic and atraumatic.  Eyes:     Conjunctiva/sclera: Conjunctivae normal.     Pupils: Pupils are equal, round, and reactive to light.  Neck:     Musculoskeletal: Normal range of motion.  Cardiovascular:     Rate and Rhythm: Normal rate and regular rhythm.     Heart sounds: Normal heart sounds.  Pulmonary:     Effort: Pulmonary effort is normal.     Breath sounds: Normal breath sounds.  Abdominal:     General: Bowel sounds are normal.     Palpations: Abdomen is soft.     Comments: firm, marble sized structure inside navel, red and warm to the touch, not reducible,  entire umbilical area is very tender  Musculoskeletal: Normal range of motion.  Skin:    General: Skin is warm and dry.  Neurological:     Mental Status: He is alert and oriented to person, place, and time.      ED Treatments / Results  Labs (all labs ordered are listed, but only abnormal results are displayed) Labs Reviewed  CBC WITH DIFFERENTIAL/PLATELET - Abnormal; Notable for the following components:      Result Value   RBC 4.05 (*)    HCT 38.3 (*)    All other components within normal limits  COMPREHENSIVE METABOLIC PANEL    EKG    Radiology No results found.  Procedures Procedures (including critical care time)  Medications Ordered in ED Medications  morphine 4 MG/ML injection 4 mg (4 mg Intravenous Given 08/07/18 0137)  ondansetron (ZOFRAN) injection 4 mg (4 mg Intravenous Given 08/07/18 0137)  sodium chloride (PF) 0.9 % injection (10 mLs  Given 08/07/18 0138)  iohexol (OMNIPAQUE) 300 MG/ML solution 100 mL (100 mLs Intravenous Contrast Given 08/07/18 0125)  morphine 4 MG/ML injection 4 mg (4 mg Intravenous Given 08/07/18 0258)     Initial Impression / Assessment and Plan / ED Course  I have reviewed the triage vital signs and the nursing notes.  Pertinent labs & imaging results that were available during my care of the patient were reviewed by me and considered in my medical decision making (see chart for details).  28 year old male presenting to the ED with umbilical pain.  States he has a known umbilical hernia this is scheduled to be repaired in August.  Over the past few days he has noticed increased size, redness, and pain.  He has not had any fevers.  Some nausea but no vomiting.  Some change in bowel movements.  On exam he is afebrile and nontoxic.  He does have evident umbilical hernia that is not reducible, there is surrounding erythema and tenderness of the entire navel.  Remainder of abdomen is soft and benign.  His labs are overall reassuring.  CT does  reveal small fat-containing umbilical hernia with evidence of strangulation/incarceration.  Discussed with Dr. Carolynne Edouardoth-- will see patient this morning, requested to hold in the ER for now.  Patient updated, given additional morphine for pain control.  Dr. Carolynne Edouardoth has evaluated and will admit for ongoing care.  OR later this morning.  Final Clinical Impressions(s) / ED Diagnoses   Final diagnoses:  Umbilical hernia, incarcerated    ED Discharge Orders    None       Sharilyn SitesSanders, Lisa  Curt Jews 08/07/18 2574    Ripley Fraise, MD 08/07/18 [phone]

## 2018-08-07 NOTE — Interval H&P Note (Signed)
History and Physical Interval Note:  08/07/2018 10:50 AM  Joseph Vazquez  has presented today for surgery, with the diagnosis of UMBILICAL HERNIA.  The various methods of treatment have been discussed with the patient and family. After consideration of risks, benefits and other options for treatment, the patient has consented to  Procedure(s): HERNIA REPAIR VENTRAL ADULT (N/A) as a surgical intervention.  The patient's history has been reviewed, patient examined, no change in status, stable for surgery.  I have reviewed the patient's chart and labs.  Questions were answered to the patient's satisfaction.     Chelsea Rich Brave

## 2018-08-07 NOTE — ED Notes (Signed)
Joseph Vazquez (father) [phone]

## 2018-08-07 NOTE — H&P (Signed)
Joseph Vazquez is an 28 y.o. male.   Chief Complaint: Abdominal pain HPI: The patient is a 28 year old white male who has been having abdominal pain for the last several days.  He has a known umbilical hernia as well as bilateral groin hernias that he has known about for the last month or 2.  Over the last several days the pain at the umbilicus has gotten worse.  He denies any fevers or chills.  He has had some nausea but no vomiting.  He came to the emergency department where a CT scan shows a small likely fat-containing umbilical hernia but no bowel involvement and no sign of obstruction.  History reviewed. No pertinent past medical history.  History reviewed. No pertinent surgical history.  History reviewed. No pertinent family history. Social History:  reports that he has been smoking cigarettes. He has been smoking about 0.05 packs per day. He uses smokeless tobacco. He reports current alcohol use. He reports current drug use. Drug: Marijuana.  Allergies: No Known Allergies  (Not in a hospital admission)   Results for orders placed or performed during the hospital encounter of 08/07/18 (from the past 48 hour(s))  CBC with Differential     Status: Abnormal   Collection Time: 08/07/18 12:20 AM  Result Value Ref Range   WBC 9.7 4.0 - 10.5 K/uL   RBC 4.05 (L) 4.22 - 5.81 MIL/uL   Hemoglobin 13.0 13.0 - 17.0 g/dL   HCT 40.938.3 (L) 81.139.0 - 91.452.0 %   MCV 94.6 80.0 - 100.0 fL   MCH 32.1 26.0 - 34.0 pg   MCHC 33.9 30.0 - 36.0 g/dL   RDW 78.212.1 95.611.5 - 21.315.5 %   Platelets 274 150 - 400 K/uL   nRBC 0.0 0.0 - 0.2 %   Neutrophils Relative % 55 %   Neutro Abs 5.4 1.7 - 7.7 K/uL   Lymphocytes Relative 34 %   Lymphs Abs 3.3 0.7 - 4.0 K/uL   Monocytes Relative 7 %   Monocytes Absolute 0.6 0.1 - 1.0 K/uL   Eosinophils Relative 3 %   Eosinophils Absolute 0.3 0.0 - 0.5 K/uL   Basophils Relative 1 %   Basophils Absolute 0.1 0.0 - 0.1 K/uL   Immature Granulocytes 0 %   Abs Immature Granulocytes 0.02 0.00  - 0.07 K/uL    Comment: Performed at [facility], [address] [place], [place] [address]  Comprehensive metabolic panel     Status: None   Collection Time: 08/07/18 12:20 AM  Result Value Ref Range   Sodium 141 135 - 145 mmol/L   Potassium 3.7 3.5 - 5.1 mmol/L   Chloride 106 98 - 111 mmol/L   CO2 26 22 - 32 mmol/L   Glucose, Bld 94 70 - 99 mg/dL   BUN 11 6 - 20 mg/dL   Creatinine, Ser 8.460.84 0.61 - 1.24 mg/dL   Calcium 8.9 8.9 - 96.210.3 mg/dL   Total Protein 6.9 6.5 - 8.1 g/dL   Albumin 4.1 3.5 - 5.0 g/dL   AST 17 15 - 41 U/L   ALT 19 0 - 44 U/L   Alkaline Phosphatase 63 38 - 126 U/L   Total Bilirubin 0.6 0.3 - 1.2 mg/dL   GFR calc non Af Amer >60 >60 mL/min   GFR calc Af Amer >60 >60 mL/min   Anion gap 9 5 - 15    Comment: Performed at [facility], [address] [place], [place] [address]   Ct  Abdomen Pelvis W Contrast  Result Date: 08/07/2018 CLINICAL DATA:  28 year old male with known umbilical hernia presenting with pain and tenderness and redness over the hernia. EXAM: CT ABDOMEN AND PELVIS WITH CONTRAST TECHNIQUE: Multidetector CT imaging of the abdomen and pelvis was performed using the standard protocol following bolus administration of intravenous contrast. CONTRAST:  134mL OMNIPAQUE IOHEXOL 300 MG/ML  SOLN COMPARISON:  CT of the abdomen pelvis dated 12/17/2011. FINDINGS: Lower chest: The visualized lung bases are clear. No intra-abdominal free air or free fluid. Hepatobiliary: No focal liver abnormality is seen. No gallstones, gallbladder wall thickening, or biliary dilatation. Pancreas: Unremarkable. No pancreatic ductal dilatation or surrounding inflammatory changes. Spleen: Normal in size without focal abnormality. Adrenals/Urinary Tract: Adrenal glands are unremarkable. Kidneys are normal, without renal calculi, focal lesion, or hydronephrosis. Bladder is unremarkable. Stomach/Bowel: Stomach is within normal limits. Appendix appears  normal. No evidence of bowel wall thickening, distention, or inflammatory changes. Vascular/Lymphatic: No significant vascular findings are present. No enlarged abdominal or pelvic lymph nodes. Reproductive: The prostate and seminal vesicles are grossly unremarkable. The left testicle is located within the left inguinal canal. Other: There is a 2.3 x 1.8 cm fat containing umbilical hernia. There is inflammatory changes of the herniated fat and surrounding subcutaneous soft tissue most consistent with strangulation or incarceration. Clinical correlation is recommended. No drainable fluid collection or abscess identified. Musculoskeletal: No acute or significant osseous findings. IMPRESSION: 1. Small fat containing umbilical hernia with findings of strangulation or incarceration. Clinical correlation is recommended. No drainable fluid collection or abscess. 2. The left testicle is located within the left inguinal canal. Electronically Signed   By: Anner Crete M.D.   On: 08/07/2018 01:51    Review of Systems  Constitutional: Negative.   HENT: Negative.   Eyes: Negative.   Respiratory: Negative.   Cardiovascular: Negative.   Gastrointestinal: Positive for abdominal pain and nausea. Negative for vomiting.  Genitourinary: Negative.   Musculoskeletal: Negative.   Skin: Negative.   Neurological: Negative.   Endo/Heme/Allergies: Negative.   Psychiatric/Behavioral: Negative.     Blood pressure 122/74, pulse 60, temperature 97.8 F (36.6 C), temperature source Oral, resp. rate 16, height 6' (1.829 m), weight 93 kg, SpO2 99 %. Physical Exam  Constitutional: He is oriented to person, place, and time. He appears well-developed and well-nourished. No distress.  HENT:  Head: Normocephalic and atraumatic.  Mouth/Throat: No oropharyngeal exudate.  Eyes: Pupils are equal, round, and reactive to light. Conjunctivae and EOM are normal.  Neck: Normal range of motion. Neck supple.  Cardiovascular: Normal  rate, regular rhythm and normal heart sounds.  Respiratory: Effort normal and breath sounds normal. No stridor. No respiratory distress.  GI: Soft. Bowel sounds are normal. There is abdominal tenderness.  There is a small area of cellulitis above umbilicus and small hernia does not reduce  Musculoskeletal: Normal range of motion.        General: No tenderness or edema.  Neurological: He is alert and oriented to person, place, and time. Coordination normal.  Skin: Skin is warm and dry. There is erythema.  Psychiatric: He has a normal mood and affect. His behavior is normal. Thought content normal.     Assessment/Plan The patient appears to have a small incarcerated umbilical hernia with cellulitis.  This is likely involving a small amount of omental fat but no bowel involvement.  Because of the incarcerated nature of the hernia and because of the cellulitis I think he should have this fixed more urgently.  I  have discussed with him in detail the risks and benefits of the operation as well as some of the technical aspects and he understands and wishes to proceed.  I would not use mesh today given the presence of cellulitis.  Chevis PrettyPaul Toth III, MD 08/07/2018, 7:27 AM

## 2018-08-07 NOTE — Transfer of Care (Signed)
Immediate Anesthesia Transfer of Care Note  Patient: Joseph Vazquez  Procedure(s) Performed: HERNIA REPAIR umblical (N/A Abdomen)  Patient Location: PACU  Anesthesia Type:General  Level of Consciousness: awake, alert  and oriented  Airway & Oxygen Therapy: Patient Spontanous Breathing and Patient connected to nasal cannula oxygen  Post-op Assessment: Report given to RN and Post -op Vital signs reviewed and stable  Post vital signs: Reviewed and stable  Last Vitals:  Vitals Value Taken Time  BP    Temp    Pulse    Resp    SpO2      Last Pain:  Vitals:   08/07/18 0655  TempSrc: Oral  PainSc: 6          Complications: No apparent anesthesia complications

## 2018-08-07 NOTE — Discharge Instructions (Signed)
[facility]  UMBILICAL OR INGUINAL HERNIA REPAIR: POST OP INSTRUCTIONS  Always review your discharge instruction sheet given to you by the facility where your surgery was performed. IF YOU HAVE DISABILITY OR FAMILY LEAVE FORMS, YOU MUST BRING THEM TO THE OFFICE FOR PROCESSING.   DO NOT GIVE THEM TO YOUR DOCTOR.  1. A  prescription for pain medication may be given to you upon discharge.  Take your pain medication as prescribed, if needed.  If narcotic pain medicine is not needed, then you may take acetaminophen (Tylenol) or ibuprofen (Advil) as needed. 2. Take your usually prescribed medications unless otherwise directed. If you need a refill on your pain medication, please contact your pharmacy.  They will contact our office to request authorization. Prescriptions will not be filled after 5 pm or on week-ends. 3. You should follow a light diet the first 24 hours after arrival home, such as soup and crackers, etc.  Be sure to include lots of fluids daily.  Resume your normal diet the day after surgery. 4.Most patients will experience some swelling and bruising around the umbilicus or in the groin and scrotum.  Ice packs and reclining will help.  Swelling and bruising can take several days to resolve.  6. It is common to experience some constipation if taking pain medication after surgery.  Increasing fluid intake and taking a stool softener (such as Colace) will usually help or prevent this problem from occurring.  A mild laxative (Milk of Magnesia or Miralax) should be taken according to package directions if there are no bowel movements after 48 hours. 7. Wound care: Remove your outer dressing about 36 hours after surgery.  There is packing in the upper part of your umbilicus to drain the infected fluid.  Pulled this packing out when you remove your outer dressing.  There are Steri-Strips on the incision which will stay in place for 1 week.  If they fall off that is okay.   Keep a dry gauze dressing over the wound and change this daily and as needed.  You may have some drainage from the open area at the upper part of your umbilicus. 8. ACTIVITIES:  You may resume regular (light) daily activities beginning the next day--such as daily self-care, walking, climbing stairs--gradually increasing activities as tolerated.  You may have sexual intercourse when it is comfortable.  Refrain from any heavy lifting or straining until approved by your doctor.  a.You may drive when you are no longer taking prescription pain medication, you can comfortably wear a seatbelt, and you can safely maneuver your car and apply brakes. b.RETURN TO WORK:  1 week _____________________________________________  9.You should see your doctor in the office for a follow-up appointment approximately 2-3 weeks after your surgery.  Make sure that you call for this appointment within a day or two after you arrive home to insure a convenient appointment time. 10.OTHER INSTRUCTIONS: _________________________    _____________________________________  WHEN TO CALL YOUR DOCTOR: 1. Fever over 101.0 2. Inability to urinate 3. Nausea and/or vomiting 4. Extreme swelling or bruising 5. Continued bleeding from incision. 6. Increased pain, redness, or drainage from the incision  The clinic staff is available to answer your questions during regular business hours.  Please dont hesitate to call and ask to speak to one of the nurses for clinical concerns.  If you have a medical emergency, go to the nearest emergency room or call 911.  A surgeon from [facility] is always on  call at the hospital   [address], [place], [place], [place]  [address] ?  [address], [place], [place]   [address] [phone] ? [phone] ? FAX [phone] Web site: [email or web address]    Steps to Quit Smoking Smoking tobacco is the leading cause of preventable death. It can affect almost  every organ in the body. Smoking puts you and those around you at risk for developing many serious chronic diseases. Quitting smoking can be difficult, but it is one of the best things that you can do for your health. It is never too late to quit. How do I get ready to quit? When you decide to quit smoking, create a plan to help you succeed. Before you quit:  Pick a date to quit. Set a date within the next 2 weeks to give you time to prepare.  Write down the reasons why you are quitting. Keep this list in places where you will see it often.  Tell your family, friends, and co-workers that you are quitting. Support from your loved ones can make quitting easier.  Talk with your health care provider about your options for quitting smoking.  Find out what treatment options are covered by your health insurance.  Identify people, places, things, and activities that make you want to smoke (triggers). Avoid them. What first steps can I take to quit smoking?  Throw away all cigarettes at home, at work, and in your car.  Throw away smoking accessories, such as Scientist, research (medical).  Clean your car. Make sure to empty the ashtray.  Clean your home, including curtains and carpets. What strategies can I use to quit smoking? Talk with your health care provider about combining strategies, such as taking medicines while you are also receiving in-person counseling. Using these two strategies together makes you more likely to succeed in quitting than if you used either strategy on its own.  If you are pregnant or breastfeeding, talk with your health care provider about finding counseling or other support strategies to quit smoking. Do not take medicine to help you quit smoking unless your health care provider tells you to do so. To quit smoking: Quit right away  Quit smoking completely, instead of gradually reducing how much you smoke over a period of time. Research shows that stopping smoking right  away is more successful than gradually quitting.  Attend in-person counseling to help you build problem-solving skills. You are more likely to succeed in quitting if you attend counseling sessions regularly. Even short sessions of 10 minutes can be effective. Take medicine You may take medicines to help you quit smoking. Some medicines require a prescription and some you can purchase over-the-counter. Medicines may have nicotine in them to replace the nicotine in cigarettes. Medicines may:  Help to stop cravings.  Help to relieve withdrawal symptoms. Your health care provider may recommend:  Nicotine patches, gum, or lozenges.  Nicotine inhalers or sprays.  Non-nicotine medicine that is taken by mouth. Find resources Find resources and support systems that can help you to quit smoking and remain smoke-free after you quit. These resources are most helpful when you use them often. They include:  Online chats with a Social worker.  Telephone quitlines.  Printed Furniture conservator/restorer.  Support groups or group counseling.  Text messaging programs.  Mobile phone apps or applications. Use apps that can help you stick to your quit plan by providing reminders, tips, and encouragement. There are many free apps for mobile devices as  well as websites. Examples include Quit Guide from the [employer] and smokefree.gov What things can I do to make it easier to quit?   Reach out to your family and friends for support and encouragement. Call telephone quitlines (1-800-QUIT-NOW), reach out to support groups, or work with a counselor for support.  Ask people who smoke to avoid smoking around you.  Avoid places that trigger you to smoke, such as bars, parties, or smoke-break areas at work.  Spend time with people who do not smoke.  Lessen the stress in your life. Stress can be a smoking trigger for some people. To lessen stress, try: ? Exercising regularly. ? Doing deep-breathing exercises. ? Doing  yoga. ? Meditating. ? Performing a body scan. This involves closing your eyes, scanning your body from head to toe, and noticing which parts of your body are particularly tense. Try to relax the muscles in those areas. How will I feel when I quit smoking? Day 1 to 3 weeks Within the first 24 hours of quitting smoking, you may start to feel withdrawal symptoms. These symptoms are usually most noticeable 2-3 days after quitting, but they usually do not last for more than 2-3 weeks. You may experience these symptoms:  Mood swings.  Restlessness, anxiety, or irritability.  Trouble concentrating.  Dizziness.  Strong cravings for sugary foods and nicotine.  Mild weight gain.  Constipation.  Nausea.  Coughing or a sore throat.  Changes in how the medicines that you take for unrelated issues work in your body.  Depression.  Trouble sleeping (insomnia). Week 3 and afterward After the first 2-3 weeks of quitting, you may start to notice more positive results, such as:  Improved sense of smell and taste.  Decreased coughing and sore throat.  Slower heart rate.  Lower blood pressure.  Clearer skin.  The ability to breathe more easily.  Fewer sick days. Quitting smoking can be very challenging. Do not get discouraged if you are not successful the first time. Some people need to make many attempts to quit before they achieve long-term success. Do your best to stick to your quit plan, and talk with your health care provider if you have any questions or concerns. Summary  Smoking tobacco is the leading cause of preventable death. Quitting smoking is one of the best things that you can do for your health.  When you decide to quit smoking, create a plan to help you succeed.  Quit smoking right away, not slowly over a period of time.  When you start quitting, seek help from your health care provider, family, or friends. This information is not intended to replace advice given to  you by your health care provider. Make sure you discuss any questions you have with your health care provider. Document Released: 01/20/2001 Document Revised: 04/15/2018 Document Reviewed: 04/16/2018 Elsevier Patient Education  2020 [employer]    Smoking Tobacco Information, Adult Smoking tobacco can be harmful to your health. Tobacco contains a poisonous (toxic), colorless chemical called nicotine. Nicotine is addictive. It changes the brain and can make it hard to stop smoking. Tobacco also has other toxic chemicals that can hurt your body and raise your risk of many cancers. How can smoking tobacco affect me? Smoking tobacco puts you at risk for:  Cancer. Smoking is most commonly associated with lung cancer, but can also lead to cancer in other parts of the body.  Chronic obstructive pulmonary disease (COPD). This is a long-term lung condition that makes it hard to  breathe. It also gets worse over time.  High blood pressure (hypertension), heart disease, stroke, or heart attack.  Lung infections, such as pneumonia.  Cataracts. This is when the lenses in the eyes become clouded.  Digestive problems. This may include peptic ulcers, heartburn, and gastroesophageal reflux disease (GERD).  Oral health problems, such as gum disease and tooth loss.  Loss of taste and smell. Smoking can affect your appearance by causing:  Wrinkles.  Yellow or stained teeth, fingers, and fingernails. Smoking tobacco can also affect your social life, because:  It may be challenging to find places to smoke when away from home. Many workplaces, [employer]aurants, hotels, and public places are tobacco-free.  Smoking is expensive. This is due to the cost of tobacco and the long-term costs of treating health problems from smoking.  Secondhand smoke may affect those around you. Secondhand smoke can cause lung cancer, breathing problems, and heart disease. Children of smokers have a higher risk for: ? Sudden  infant death syndrome (SIDS). ? Ear infections. ? Lung infections. If you currently smoke tobacco, quitting now can help you:  Lead a longer and healthier life.  Look, smell, breathe, and feel better over time.  Save money.  Protect others from the harms of secondhand smoke. What actions can I take to prevent health problems? Quit smoking   Do not start smoking. Quit if you already do.  Make a plan to quit smoking and commit to it. Look for programs to help you and ask your health care provider for recommendations and ideas.  Set a date and write down all the reasons you want to quit.  Let your friends and family know you are quitting so they can help and support you. Consider finding friends who also want to quit. It can be easier to quit with someone else, so that you can support each other.  Talk with your health care provider about using nicotine replacement medicines to help you quit, such as gum, lozenges, patches, sprays, or pills.  Do not replace cigarette smoking with electronic cigarettes, which are commonly called e-cigarettes. The safety of e-cigarettes is not known, and some may contain harmful chemicals.  If you try to quit but return to smoking, stay positive. It is common to slip up when you first quit, so take it one day at a time.  Be prepared for cravings. When you feel the urge to smoke, chew gum or suck on hard candy. Lifestyle  Stay busy and take care of your body.  Drink enough fluid to keep your urine pale yellow.  Get plenty of exercise and eat a healthy diet. This can help prevent weight gain after quitting.  Monitor your eating habits. Quitting smoking can cause you to have a larger appetite than when you smoke.  Find ways to relax. Go out with friends or family to a movie or a restaurant where people do not smoke.  Ask your health care provider about having regular tests (screenings) to check for cancer. This may include blood tests, imaging  tests, and other tests.  Find ways to manage your stress, such as meditation, yoga, or exercise. Where to find support To get support to quit smoking, consider:  Asking your health care provider for more information and resources.  Taking classes to learn more about quitting smoking.  Looking for local organizations that offer resources about quitting smoking.  Joining a support group for people who want to quit smoking in your local community.  Calling the smokefree.gov  counselor helpline: 1-800-Quit-Now 628-534-6130(1-502-329-6100) Where to find more information You may find more information about quitting smoking from:  HelpGuide.org: www.helpguide.org  BankRights.uySmokefree.gov: smokefree.gov  American Lung Association: www.lung.org Contact a health care provider if you:  Have problems breathing.  Notice that your lips, nose, or fingers turn blue.  Have chest pain.  Are coughing up blood.  Feel faint or you pass out.  Have other health changes that cause you to worry. Summary  Smoking tobacco can negatively affect your health, the health of those around you, your finances, and your social life.  Do not start smoking. Quit if you already do. If you need help quitting, ask your health care provider.  Think about joining a support group for people who want to quit smoking in your local community. There are many effective programs that will help you to quit this behavior. This information is not intended to replace advice given to you by your health care provider. Make sure you discuss any questions you have with your health care provider. Document Released: 02/11/2016 Document Revised: 03/17/2017 Document Reviewed: 02/11/2016 Elsevier Patient Education  2020 [employer]

## 2018-08-07 NOTE — Op Note (Signed)
Operative Note  Joseph Vazquez  [id number]  [id number]  08/07/2018   Surgeon: Vikki Ports A ConnorMD FACCS  Assistant: none  Procedure performed: Open primary repair of very small umbilical hernia.  Incision and debridement of umbilical abscess.  Preop diagnosis: Incarcerated umbilical hernia with incarcerated/infarcted fat and overlying cellulitis Post-op diagnosis/intraop findings: Supraumbilical abscess  Specimens: No Retained items: Half-inch packing strip is inserted an open area at the upper aspect of the umbilicus.  The patient will need to remove this in 24-48h.   EBL: Minimal cc Complications: none  Description of procedure: After obtaining informed consent the patient was taken to the operating room and placed supine on operating room table wheregeneral LMA anesthesia was initiated, preoperative antibiotics were administered, SCDs applied, and a formal timeout was performed.  The patient's abdomen was clipped, prepped and draped in the usual sterile fashion.  After infiltration with local, an infraumbilical incision was made sharply and cautery was then used to dissect through the soft tissues until the umbilical stalk was identified.  This was isolated with a Kelly clamp and then cautery was used to divide this away from the fascia.  As we were performing this, purulent drainage began to exude from an ulcerated necrotic area of skin in the upper fold of the umbilicus.  There was not a hernia sac.  The fascia was probed and I could not feel any defect, although I then probed with a Kelly clamp and was able to identify a 3 mm defect.  A solitary 0 Ethibond suture was placed to close this down transversely.  Hemostasis was ensured in the wound with cautery.  Then turned to the umbilical stalk and the necrotic skin area that had begun to drain purulent fluid.  This opening was probed with a hemostat and purulent fluid evacuated.  The cavity contains what appears to be chronic granulating  tissue. The skin opening initially did not communicate with the deeper tissues, but as I was probing this area essentially created a sinus tract between the skin and the subcutaneous tissue through the superior aspect of the umbilical stalk.  This opening was packed with a short piece of half-inch packing tape.  The apex of the umbilical stalk was then re-sutured to the fascia using a 3-0 Vicryl.  The skin incision was closed with subcuticular Monocryl.  Benzoin Steri-Strips were applied.  A dry gauze dressing was placed over everything. The patient was then awakened, extubated and taken to PACU in stable condition.   All counts were correct at the completion of the case.

## 2018-08-07 NOTE — ED Notes (Signed)
CHG bath performed.

## 2018-08-07 NOTE — Anesthesia Preprocedure Evaluation (Addendum)
Anesthesia Evaluation  Patient identified by MRN, date of birth, ID band Patient awake    Reviewed: Allergy & Precautions, NPO status , Patient's Chart, lab work & pertinent test results  Airway Mallampati: II  TM Distance: >3 FB Neck ROM: Full    Dental  (+) Teeth Intact   Pulmonary Current Smoker,    Pulmonary exam normal breath sounds clear to auscultation       Cardiovascular Normal cardiovascular exam Rhythm:Regular Rate:Normal     Neuro/Psych negative neurological ROS  negative psych ROS   GI/Hepatic Neg liver ROS, GERD  ,(+)     substance abuse  alcohol use and marijuana use, Incarcerated umbilical hernia   Endo/Other  negative endocrine ROS  Renal/GU negative Renal ROS  negative genitourinary   Musculoskeletal negative musculoskeletal ROS (+)   Abdominal (+)  Abdomen: tender.    Peds  Hematology negative hematology ROS (+)   Anesthesia Other Findings   Reproductive/Obstetrics                             Anesthesia Physical Anesthesia Plan  ASA: II and emergent  Anesthesia Plan: General   Post-op Pain Management:    Induction: Intravenous  PONV Risk Score and Plan: 3 and Midazolam, Ondansetron, Dexamethasone and Treatment may vary due to age or medical condition  Airway Management Planned: LMA  Additional Equipment:   Intra-op Plan:   Post-operative Plan: Extubation in OR  Informed Consent: I have reviewed the patients History and Physical, chart, labs and discussed the procedure including the risks, benefits and alternatives for the proposed anesthesia with the patient or authorized representative who has indicated his/her understanding and acceptance.     Dental advisory given  Plan Discussed with: CRNA and Surgeon  Anesthesia Plan Comments:        Anesthesia Quick Evaluation

## 2018-08-08 ENCOUNTER — Encounter (HOSPITAL_COMMUNITY): Payer: Self-pay | Admitting: Surgery

## 2018-08-09 LAB — NOVEL CORONAVIRUS, NAA (HOSP ORDER, SEND-OUT TO REF LAB; TAT 18-24 HRS): SARS-CoV-2, NAA: NOT DETECTED

## 2018-08-29 ENCOUNTER — Ambulatory Visit: Payer: Self-pay | Admitting: Surgery

## 2018-08-29 NOTE — H&P (Signed)
Arlyce HarmanBryan C Allman Documented: 08/29/2018 4:42 PM Location: [facility] Patient #: [id number] DOB: Oct 05, 1990 Single / Language: [place] / Race: White Male  History of Present Illness Ardeth Sportsman(Marjan Rosman C. Hensley Treat MD; 08/29/2018 4:56 PM) The patient is a 28 year old male who presents for an evaluation of a hernia. Note for "Hernia": ` ` ` Patient sent for surgical consultation at the request of Dr Deatra JamesVyvyan Sun  Chief Complaint: Pain and possible hernia ` ` Patient returns after emergent reduction and repair of incarcerated umbilical hernia. Had some infection. Wound left open. He initially recovered and went home. He is quit smoking. He is back to work. He's feeling better. He still wants to plan the elective inguinal hernia surgery next month. No fevers chills or sweats.   PRIOR NOTE The patient is a had of Young male who noticed some bulging and discomfort in his left groin. Persisted. He felt a lump at his bellybutton as well. Was concerned. Eval[facility]. Inguinal hernia suspected on the left side. Surgical consultation requested. Patient's not had any prior surgery. He does have a history of some low back pain but that is stable and mild appeared resolving nonoperatively. He does smoke. He moves his bowels about twice a day. He can walk an hour without difficulty. He does work in [employer] with moderately intense physical activity. He does not have any severe debilitating pain. History of rectal bleeding and required colonoscopy. Workup consistent with hemorrhoids.  (Review of systems as stated in this history (HPI) or in the review of systems. Otherwise all other 12 point ROS are negative) ` ` `   Problem List/Past Medical Ardeth Sportsman(Keelin Neville C. Zamirah Denny, MD; 08/29/2018 4:55 PM) LEFT INGUINAL HERNIA (K40.90) INCARCERATED UMBILICAL HERNIA (K42.0) PREOP - ING HERNIA - ENCOUNTER FOR PREOPERATIVE EXAMINATION FOR GENERAL SURGICAL PROCEDURE  (Z01.818) POSTOPERATIVE STATE [phone](Z98.890)  Past Surgical History Ardeth Sportsman(Finneus Kaneshiro C. Edrei Norgaard, MD; 08/29/2018 4:55 PM) Colon Polyp Removal - Colonoscopy Oral Surgery  Diagnostic Studies History Ardeth Sportsman(Dimitry Holsworth C. Jaelin Devincentis, MD; 08/29/2018 4:55 PM) Colonoscopy 1-5 years ago  Allergies Santiago Glad(Kelsey Phillips, CMA; 08/29/2018 4:43 PM) No Known Allergies [07/13/2018]: No Known Drug Allergies [07/13/2018]: Allergies Reconciled  Medication History Santiago Glad(Kelsey Phillips, CMA; 08/29/2018 4:43 PM) Adderall XR (20MG  Capsule ER 24HR, Oral) Active. Medications Reconciled  Social History Ardeth Sportsman(Rebecka Oelkers C. Reve Crocket, MD; 08/29/2018 4:55 PM) Alcohol use Occasional alcohol use. Caffeine use Carbonated beverages, Coffee, Tea. Illicit drug use Prefer to discuss with provider. Tobacco use Current every day smoker.  Family History Ardeth Sportsman(Ettel Albergo C. Marijke Guadiana, MD; 08/29/2018 4:55 PM) Bleeding disorder Brother. Cancer Mother. Hypertension Father. Migraine Headache Brother.  Other Problems Ardeth Sportsman(Karisa Nesser C. Tabita Corbo, MD; 08/29/2018 4:55 PM) Back Pain Gastroesophageal Reflux Disease Other disease, cancer, significant illness Umbilical Hernia Repair     Review of Systems Ardeth Sportsman(Modesty Rudy C. Henessy Rohrer, MD; 08/29/2018 4:55 PM) General Present- Fatigue. Not Present- Appetite Loss, Chills, Fever, Night Sweats, Weight Gain and Weight Loss. Skin Not Present- Change in Wart/Mole, Dryness, Hives, Jaundice, New Lesions, Non-Healing Wounds, Rash and Ulcer. HEENT Present- Seasonal Allergies. Not Present- Earache, Hearing Loss, Hoarseness, Nose Bleed, Oral Ulcers, Ringing in the Ears, Sinus Pain, Sore Throat, Visual Disturbances, Wears glasses/contact lenses and Yellow Eyes. Respiratory Not Present- Bloody sputum, Chronic Cough, Difficulty Breathing, Snoring and Wheezing. Breast Not Present- Breast Mass, Breast Pain, Nipple Discharge and Skin Changes. Cardiovascular Not Present- Chest Pain, Difficulty Breathing Lying Down, Leg Cramps, Palpitations, Rapid Heart Rate,  Shortness of Breath and Swelling of Extremities. Gastrointestinal Present- Abdominal Pain, Constipation and Excessive gas. Not  Present- Bloating, Bloody Stool, Change in Bowel Habits, Chronic diarrhea, Difficulty Swallowing, Gets full quickly at meals, Hemorrhoids, Indigestion, Nausea, Rectal Pain and Vomiting. Male Genitourinary Present- Urine Leakage. Not Present- Blood in Urine, Change in Urinary Stream, Frequency, Impotence, Nocturia, Painful Urination and Urgency. Musculoskeletal Present- Back Pain. Not Present- Joint Pain, Joint Stiffness, Muscle Pain, Muscle Weakness and Swelling of Extremities. Psychiatric Present- Anxiety and Change in Sleep Pattern. Not Present- Bipolar, Depression, Fearful and Frequent crying. Endocrine Not Present- Cold Intolerance, Excessive Hunger, Hair Changes, Heat Intolerance and New Diabetes. Hematology Not Present- Blood Thinners, Easy Bruising, Excessive bleeding, Gland problems, HIV and Persistent Infections.  Vitals Santiago Glad(Kelsey Phillips CMA; 08/29/2018 4:43 PM) 08/29/2018 4:43 PM Weight: 201.6 lb Height: 72in Body Surface Area: 2.14 m Body Mass Index: 27.34 kg/m  Temp.: 98.11F  Pulse: 97 (Regular)  BP: 138/82 (Sitting, Left Arm, Standard)        Physical Exam Ardeth Sportsman(Timothea Bodenheimer C. Viviene Thurston MD; 08/29/2018 4:53 PM)  General Mental Status-Alert. General Appearance-Not in acute distress, Not Sickly. Orientation-Oriented X3. Hydration-Well hydrated. Voice-Normal.  Integumentary Global Assessment Upon inspection and palpation of skin surfaces of the - Axillae: non-tender, no inflammation or ulceration, no drainage. and Distribution of scalp and body hair is normal. General Characteristics Temperature - normal warmth is noted.  Head and Neck Head-normocephalic, atraumatic with no lesions or palpable masses. Face Global Assessment - atraumatic, no absence of expression. Neck Global Assessment - no abnormal movements, no bruit auscultated on  the right, no bruit auscultated on the left, no decreased range of motion, non-tender. Trachea-midline. Thyroid Gland Characteristics - non-tender.  Eye Eyeball - Left-Extraocular movements intact, No Nystagmus - Left. Eyeball - Right-Extraocular movements intact, No Nystagmus - Right. Cornea - Left-No Hazy - Left. Cornea - Right-No Hazy - Right. Sclera/Conjunctiva - Left-No scleral icterus, No Discharge - Left. Sclera/Conjunctiva - Right-No scleral icterus, No Discharge - Right. Pupil - Left-Direct reaction to light normal. Pupil - Right-Direct reaction to light normal.  ENMT Ears Pinna - Left - no drainage observed, no generalized tenderness observed. Pinna - Right - no drainage observed, no generalized tenderness observed. Nose and Sinuses External Inspection of the Nose - no destructive lesion observed. Inspection of the nares - Left - quiet respiration. Inspection of the nares - Right - quiet respiration. Mouth and Throat Lips - Upper Lip - no fissures observed, no pallor noted. Lower Lip - no fissures observed, no pallor noted. Nasopharynx - no discharge present. Oral Cavity/Oropharynx - Tongue - no dryness observed. Oral Mucosa - no cyanosis observed. Hypopharynx - no evidence of airway distress observed.  Chest and Lung Exam Inspection Movements - Normal and Symmetrical. Accessory muscles - No use of accessory muscles in breathing. Palpation Palpation of the chest reveals - Non-tender. Auscultation Breath sounds - Normal and Clear.  Cardiovascular Auscultation Rhythm - Regular. Murmurs & Other Heart Sounds - Auscultation of the heart reveals - No Murmurs and No Systolic Clicks.  Abdomen Inspection Inspection of the abdomen reveals - No Visible peristalsis and No Abnormal pulsations. Umbilicus - No Bleeding, No Urine drainage. Palpation/Percussion Palpation and Percussion of the abdomen reveal - Soft, Non Tender, No Rebound tenderness, No Rigidity  (guarding) and No Cutaneous hyperesthesia. Note: Abdomen soft. umbilical incision closed - no cellulitis or hernia recurrence. Nontender. Not distended. No umbilical or incisional hernias. No guarding.  Male Genitourinary Sexual Maturity Tanner 5 - Adult hair pattern and Adult penile size and shape. Note: Obvious left inguinal hernia. No definite right. Normal external genitalia. Epididymi, testes,  and spermatic cords normal without any masses.  Peripheral Vascular Upper Extremity Inspection - Left - No Cyanotic nailbeds - Left, Not Ischemic. Inspection - Right - No Cyanotic nailbeds - Right, Not Ischemic.  Neurologic Neurologic evaluation reveals -normal attention span and ability to concentrate, able to name objects and repeat phrases. Appropriate fund of knowledge , normal sensation and normal coordination. Mental Status Affect - not angry, not paranoid. Cranial Nerves-Normal Bilaterally. Gait-Normal.  Neuropsychiatric Mental status exam performed with findings of-able to articulate well with normal speech/language, rate, volume and coherence, thought content normal with ability to perform basic computations and apply abstract reasoning and no evidence of hallucinations, delusions, obsessions or homicidal/suicidal ideation.  Musculoskeletal Global Assessment Spine, Ribs and Pelvis - no instability, subluxation or laxity. Right Upper Extremity - no instability, subluxation or laxity.  Lymphatic Head & Neck  General Head & Neck Lymphatics: Bilateral - Description - No Localized lymphadenopathy. Axillary  General Axillary Region: Bilateral - Description - No Localized lymphadenopathy. Femoral & Inguinal  Generalized Femoral & Inguinal Lymphatics: Left - Description - No Localized lymphadenopathy. Right - Description - No Localized lymphadenopathy.    Assessment & Plan Adin Hector MD; 08/29/2018 4:55 PM)  LEFT INGUINAL HERNIA (K40.90) Impression: Definite  left inguinal hernia. Seems rather medial like a direct space hernia. Subtle impulse on the right side concerning for possible small hernia there as well. I think he would benefit from laparoscopic exploration and repair of hernias found. I think it is reasonable to plan elective hernia repair 6 weeks from now. He is quit smoking which should lower his risk.  These are back to work without issues at except for some tolerable left groin discomfort  Current Plans You are being scheduled for surgery- Our schedulers will call you.  You should hear from our office's scheduling department within 5 working days about the location, date, and time of surgery. We try to make accommodations for patient's preferences in scheduling surgery, but sometimes the OR schedule or the surgeon's schedule prevents [place] from making those accommodations.  If you have not heard from our office [phone]) in 5 working days, call the office and ask for your surgeon's nurse.  If you have other questions about your diagnosis, plan, or surgery, call the office and ask for your surgeon's nurse.  The anatomy & physiology of the abdominal wall and pelvic floor was discussed. The pathophysiology of hernias in the inguinal and pelvic region was discussed. Natural history risks such as progressive enlargement, pain, incarceration, and strangulation was discussed. Contributors to complications such as smoking, obesity, diabetes, prior surgery, etc were discussed.  I feel the risks of no intervention will lead to serious problems that outweigh the operative risks; therefore, I recommended surgery to reduce and repair the hernia. I explained laparoscopic techniques with possible need for an open approach. I noted usual use of mesh to patch and/or buttress hernia repair  Risks such as bleeding, infection, abscess, need for further treatment, heart attack, death, and other risks were discussed. I noted a good likelihood this  will help address the problem. Goals of post-operative recovery were discussed as well. Possibility that this will not correct all symptoms was explained. I stressed the importance of low-impact activity, aggressive pain control, avoiding constipation, & not pushing through pain to minimize risk of post-operative chronic pain or injury. Possibility of reherniation was discussed. We will work to minimize complications.  An educational handout further explaining the pathology & treatment options was given as well.  Questions were answered. The patient expresses understanding & wishes to proceed with surgery.  Pt Education - [facility] Hernia Post-Op HCI (Tabithia Stroder): discussed with patient and provided information. Pt Education - [facility]CS Mesh education: discussed with patient and provided information.  INCARCERATED UMBILICAL HERNIA (K42.0) Impression: Recovering status post emergent repair of incarcerated infected umbilical hernia. Wound is closed down.  I'm glad he is quit smoking.  I think is reasonable plan the elective inguinal hernia surgery next month   PREOP - ING HERNIA - ENCOUNTER FOR PREOPERATIVE EXAMINATION FOR GENERAL SURGICAL PROCEDURE (Z01.818)  Current Plans You are being scheduled for surgery- Our schedulers will call you.  You should hear from our office's scheduling department within 5 working days about the location, date, and time of surgery. We try to make accommodations for patient's preferences in scheduling surgery, but sometimes the OR schedule or the surgeon's schedule prevents us from making those accommodations.  If you have not heard from our office [phone]) in 5 working days, call the office and ask for your surgeon's nurse.  If you have other questions about your diagnosis, plan, or surgery, call the office and ask for your surgeon's nurse.  Written instructions provided The anatomy & physiology of the abdominal wall and pelvic floor was discussed. The  pathophysiology of hernias in the inguinal and pelvic region was discussed. Natural history risks such as progressive enlargement, pain, incarceration, and strangulation was discussed. Contributors to complications such as smoking, obesity, diabetes, prior surgery, etc were discussed.  I feel the risks of no intervention will lead to serious problems that outweigh the operative risks; therefore, I recommended surgery to reduce and repair the hernia. I explained laparoscopic techniques with possible need for an open approach. I noted usual use of mesh to patch and/or buttress hernia repair  Risks such as bleeding, infection, abscess, need for further treatment, heart attack, death, and other risks were discussed. I noted a good likelihood this will help address the problem. Goals of post-operative recovery were discussed as well. Possibility that this will not correct all symptoms was explained. I stressed the importance of low-impact activity, aggressive pain control, avoiding constipation, & not pushing through pain to minimize risk of post-operative chronic pain or injury. Possibility of reherniation was discussed. We will work to minimize complications.  An educational handout further explaining the pathology & treatment options was given as well. Questions were answered. The patient expresses understanding & wishes to proceed with surgery.  Pt Education - Pamphlet Given - Laparoscopic Hernia Repair: discussed with patient and provided information. Pt Education - [facility]CS Pain Control (Darsi Tien) Pt Education - [facility] Hernia Post-Op HCI (Keturah Yerby): discussed with patient and provided information. Pt Education - [facility] Mesh education: discussed with patient and provided information.  Ardeth SportsmanSteven C. Viveca Beckstrom, MD, FACS, MASCRS Gastrointestinal and Minimally Invasive Surgery    [address] [place] [id number] [phone] Main / Paging [phone] Fax

## 2018-10-23 ENCOUNTER — Ambulatory Visit (HOSPITAL_COMMUNITY)
Admission: EM | Admit: 2018-10-23 | Discharge: 2018-10-23 | Disposition: A | Discharge status: Home / Self Care | Payer: 59 | Attending: Family Medicine | Admitting: Family Medicine

## 2018-10-23 ENCOUNTER — Encounter (HOSPITAL_COMMUNITY): Payer: Self-pay

## 2018-10-23 ENCOUNTER — Other Ambulatory Visit: Payer: Self-pay

## 2018-10-23 DIAGNOSIS — L03311 Cellulitis of abdominal wall: Secondary | ICD-10-CM | POA: Insufficient documentation

## 2018-10-23 MED ORDER — CEFTRIAXONE SODIUM 1 G IJ SOLR
INTRAMUSCULAR | Status: AC
  Filled 2018-10-23: qty 10

## 2018-10-23 MED ORDER — AMOXICILLIN-POT CLAVULANATE 875-125 MG PO TABS: 1.0000 | ORAL_TABLET | Freq: Two times a day (BID) | ORAL | 0 refills | Status: DC

## 2018-10-23 MED ORDER — CEFTRIAXONE SODIUM 1 G IJ SOLR
1.0000 g | Freq: Once | INTRAMUSCULAR | Status: AC
  Administered 2018-10-23: 1 g via INTRAMUSCULAR

## 2018-10-23 MED ORDER — IBUPROFEN 800 MG PO TABS: 800.0000 mg | ORAL_TABLET | Freq: Three times a day (TID) | ORAL | 0 refills | Status: DC

## 2018-10-23 MED ORDER — LIDOCAINE HCL (PF) 1 % IJ SOLN
INTRAMUSCULAR | Status: AC
  Filled 2018-10-23: qty 2

## 2018-10-23 NOTE — Discharge Instructions (Addendum)
Take ibuprofen 3 times a day with food Take Augmentin 1 pill every 12 hours with food Call your surgeon in the morning for follow-up

## 2018-10-23 NOTE — ED Provider Notes (Signed)
[facility]    CSN: [id number] Arrival date & time: 10/23/18  1712      History   Chief Complaint Chief Complaint  Patient presents with  . Wound Check    HPI Joseph Vazquez is a 28 y.o. male.   HPI  Patient states he had surgery about 2 weeks ago because of an incarcerated hernia.  He states that he was feeling well this morning.  As the day went on he noticed increased redness in his abdomen.  He also had some body aches.  He looked at his abdomen this afternoon and realized he had a big red infection forming.  He is here for evaluation.  Fever  History reviewed. No pertinent past medical history.  Patient Active Problem List   Diagnosis Date Noted  . Right knee pain 07/05/2012  . Nicotine addiction 12/01/2011  . GERD 09/17/2008  . OTHER SEBORRHEIC DERMATITIS 09/17/2008  . HYPERHIDROSIS 09/17/2008    Past Surgical History:  Procedure Laterality Date  . VENTRAL HERNIA REPAIR N/A 08/07/2018   Procedure: HERNIA REPAIR umblical;  Surgeon: Berna Bueonnor, Chelsea A, MD;  Location: [facility];  Service: General;  Laterality: N/A;       Home Medications    Prior to Admission medications   Medication Sig Start Date End Date Taking? Authorizing Provider  gabapentin (NEURONTIN) 300 MG capsule Take 300 mg by mouth 2 (two) times daily as needed (nerve pain).   Yes [provider]  amoxicillin-clavulanate (AUGMENTIN) 875-125 MG tablet Take 1 tablet by mouth every 12 (twelve) hours. 10/23/18   Eustace MooreNelson, Arra Connaughton Sue, MD  ibuprofen (ADVIL) 800 MG tablet Take 1 tablet (800 mg total) by mouth 3 (three) times daily. 10/23/18   Eustace MooreNelson, Shaelynn Dragos Sue, MD    Family History Family History  Problem Relation Age of Onset  . Cancer Mother   . Healthy Father     Social History Social History   Tobacco Use  . Smoking status: Current Every Day Smoker    Packs/day: 0.05    Types: Cigarettes  . Smokeless tobacco: Current User  Substance Use Topics  . Alcohol use: Yes    Comment:  Socially  . Drug use: Yes    Types: Marijuana     Allergies   Patient has no known allergies.   Review of Systems Review of Systems  Constitutional: Negative for chills and fever.  HENT: Negative for ear pain and sore throat.   Eyes: Negative for pain and visual disturbance.  Respiratory: Negative for cough and shortness of breath.   Cardiovascular: Negative for chest pain and palpitations.  Gastrointestinal: Negative for abdominal pain and vomiting.  Genitourinary: Negative for dysuria and hematuria.  Musculoskeletal: Negative for arthralgias and back pain.  Skin: Positive for color change. Negative for rash.  Neurological: Negative for seizures and syncope.  All other systems reviewed and are negative.    Physical Exam Triage Vital Signs ED Triage Vitals  Enc Vitals Group     BP 10/23/18 1756 125/73     Pulse Rate 10/23/18 1756 88     Resp 10/23/18 1756 16     Temp 10/23/18 1756 97.9 F (36.6 C)     Temp Source 10/23/18 1756 Temporal     SpO2 10/23/18 1756 100 %     Weight --      Height --      Head Circumference --      Peak Flow --      Pain Score 10/23/18 1754 5  Pain Loc --      Pain Edu? --      Excl. in GC? --    No data found.  Updated Vital Signs BP 125/73 (BP Location: Right Arm)   Pulse 88   Temp 97.9 F (36.6 C) (Temporal)   Resp 16   SpO2 100%       Physical Exam Constitutional:      General: He is not in acute distress.    Appearance: He is well-developed.  HENT:     Head: Normocephalic and atraumatic.  Eyes:     Conjunctiva/sclera: Conjunctivae normal.     Pupils: Pupils are equal, round, and reactive to light.  Neck:     Musculoskeletal: Normal range of motion.  Cardiovascular:     Rate and Rhythm: Normal rate.  Pulmonary:     Effort: Pulmonary effort is normal. No respiratory distress.  Abdominal:     General: There is no distension.     Palpations: Abdomen is soft.  Musculoskeletal: Normal range of motion.  Skin:     General: Skin is warm and dry.  Neurological:     Mental Status: He is alert.      There is some drainage from the umbilicus, cultured  UC Treatments / Results  Labs (all labs ordered are listed, but only abnormal results are displayed) Labs Reviewed  AEROBIC CULTURE (SUPERFICIAL SPECIMEN)    EKG   Radiology No results found.  Procedures Procedures (including critical care time)  Medications Ordered in UC Medications  cefTRIAXone (ROCEPHIN) injection 1 g (1 g Intramuscular Given 10/23/18 1855)  cefTRIAXone (ROCEPHIN) 1 g injection (has no administration in time range)  lidocaine (PF) (XYLOCAINE) 1 % injection (has no administration in time range)    Initial Impression / Assessment and Plan / UC Course  I have reviewed the triage vital signs and the nursing notes.  Pertinent labs & imaging results that were available during my care of the patient were reviewed by me and considered in my medical decision making (see chart for details).     I mentioned to the patient that I would consider giving him pain medication.  He initially answered that it would not do any good because he had taken a Suboxone this morning.  At the end of our conversation he asked if he could "change my answer" and have some pain medication.  I told him that I would not give narcotic pain medication to the patient taking Suboxone.  Further, he was taking somebody else's Suboxone and not Suboxone that was prescribed for him.  I explained to him that taking somebody else's controlled substance is a felony.  This is another reason that I would not give him stronger pain medication    Final Clinical Impressions(s) / UC Diagnoses   Final diagnoses:  Cellulitis of abdominal wall     Discharge Instructions     Take ibuprofen 3 times a day with food Take Augmentin 1 pill every 12 hours with food Call your surgeon in the morning for follow-up    ED Prescriptions    Medication Sig Dispense Auth.  Provider   amoxicillin-clavulanate (AUGMENTIN) 875-125 MG tablet Take 1 tablet by mouth every 12 (twelve) hours. 14 tablet Eustace Moore, MD   ibuprofen (ADVIL) 800 MG tablet Take 1 tablet (800 mg total) by mouth 3 (three) times daily. 21 tablet Eustace Moore, MD     Controlled Substance Prescriptions [place] Controlled Substance Registry consulted? Not Applicable   Delton See,  Jennette Banker, MD 10/23/18 [phone]

## 2018-10-23 NOTE — ED Triage Notes (Signed)
Patient presents to Urgent Care with complaints of possible infection around his umbilicus since having three hernias reduced last month. Patient reports there is about 10 inches of redness around his umbilicus.  Pt states he was not given antibiotics after the surgery.

## 2018-10-26 LAB — AEROBIC CULTURE W GRAM STAIN (SUPERFICIAL SPECIMEN)
Culture: NORMAL
Gram Stain: NONE SEEN
Special Requests: NORMAL

## 2019-07-13 ENCOUNTER — Telehealth: Payer: Self-pay

## 2019-07-13 ENCOUNTER — Ambulatory Visit: Payer: Self-pay

## 2019-07-13 ENCOUNTER — Encounter: Payer: Self-pay | Admitting: Surgery

## 2019-07-13 ENCOUNTER — Ambulatory Visit: Payer: 59 | Admitting: Surgery

## 2019-07-13 VITALS — BP 131/82 | HR 84 | Ht 72.0 in | Wt 205.0 lb

## 2019-07-13 DIAGNOSIS — M5416 Radiculopathy, lumbar region: Secondary | ICD-10-CM | POA: Diagnosis not present

## 2019-07-13 DIAGNOSIS — M48062 Spinal stenosis, lumbar region with neurogenic claudication: Secondary | ICD-10-CM | POA: Diagnosis not present

## 2019-07-13 MED ORDER — KETOROLAC TROMETHAMINE 30 MG/ML IJ SOLN: 30.0000 mg | Freq: Once | INTRAMUSCULAR | Status: AC

## 2019-07-13 MED ORDER — METHOCARBAMOL 500 MG PO TABS
500.0000 mg | ORAL_TABLET | Freq: Three times a day (TID) | ORAL | 0 refills | Status: DC | PRN
Start: 2019-07-13 — End: 2020-10-21

## 2019-07-13 MED ORDER — METHYLPREDNISOLONE ACETATE 80 MG/ML IJ SUSP: 80.0000 mg | Freq: Once | INTRAMUSCULAR | Status: DC

## 2019-07-13 MED ORDER — METHYLPREDNISOLONE 4 MG PO TABS: ORAL_TABLET | ORAL | 0 refills | Status: DC

## 2019-07-13 NOTE — Progress Notes (Signed)
Office Visit Note   Patient: Joseph Vazquez           Date of Birth: 07/25/1990           MRN: [id number] Visit Date: 07/13/2019              Requested by: Deatra Arnett Galindez, MD [phone] [place],  [place] [address] PCP: Deatra Janean Eischen, MD   Assessment & Plan: Visit Diagnoses:  1. Spinal stenosis of lumbar region with neurogenic claudication   2. Radiculopathy, lumbar region     Plan:  In hopes of giving patient quick relief we did give him Depo-Medrol 80 mg and Toradol 30 mg IM injections in the clinic today.  I sent in Medrol Dosepak 6-day taper and Robaxin to his pharmacy.  will start the prednisone taper tomorrow.  Follow-up in 3 weeks for recheck but he will call me in 2 weeks to let me know how he is feeling.  If he continues to be symptomatic I will plan to repeat lumbar MRI and compare to the study that was done in 2019 and then will follow up with Dr. Otelia Sergeant after to discuss findings.  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  I did prefer to do it longer but he states that he needs to work.   Follow-Up Instructions: Return in about 3 weeks (around 08/03/2019) for with dr Otelia Sergeant for recheck lumbar spine.   Orders:  Orders Placed This Encounter  Procedures  . XR Lumbar Spine Complete   No orders of the defined types were placed in this encounter.     Procedures: No procedures performed   Clinical Data: No additional findings.   Subjective: Chief Complaint  Patient presents with  . Lower Back - Pain    HPI 29 year old white male returns office with complaints of low back pain and now bilateral lower extremity radiculopathy.  Patient was last seen by [place] in 2019 and he had lumbar MRI which showed:  EXAM: MRI LUMBAR SPINE WITHOUT CONTRAST  TECHNIQUE: Multiplanar, multisequence MR imaging of the lumbar spine was performed. No intravenous contrast was administered.  COMPARISON:  CT myelogram 07/30/2015  FINDINGS: Segmentation:  5 lumbar type vertebral bodies  by radiography  Alignment:  Normal  Vertebrae:  No fracture, evidence of discitis, or bone lesion.  Conus medullaris and cauda equina: Conus extends to the L1 level. Conus and cauda equina appear normal.  Paraspinal and other soft tissues: Negative  Disc levels:  T12- L1: Unremarkable.  L1-L2: Minor annulus bulging  L2-L3: Minor annulus bulging  L3-L4: Minor annulus bulging  L4-L5: Disc narrowing and endplate degeneration with central protrusion compressing the L5 nerve roots in the subarticular recesses. Spinal stenosis is mild borderline moderate. The foramina are patent  L5-S1:Disc narrowing and desiccation with left eccentric disc protrusion compressing the left S1 nerve root. Disc also contacts the right S1 nerve root without compression. Patent foramina  IMPRESSION: 1. L4-5 disc degeneration with protrusion compressing the left more than right L5 nerve roots in the subarticular recesses. 2. L5-S1 disc degeneration with left eccentric disc protrusion impinging on the left S1 nerve root.  Patient is advised ultimately come down to him needing surgery but he is wanting to put this off for as long as possible.  He has had chronic low back pain and left lower extremity radiculopathy and intermittent episodes of his left leg feeling weak but about 2 months ago he began having similar symptoms in the right leg.  States that he a has also had episodes of his right leg giving out on him.  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  He states that he is still wanting to hold off on lumbar spine surgery which may end up being in a fusion.  Denies any bowel or bladder incontinence.  Pain when he is ambulating, twisting, bending.  He denies any new injury.   Review of systems : no current cardiac pulmonary GI GU issues  Objective: Vital Signs: BP 131/82   Pulse 84   Ht 6' (1.829 m)   Wt 205 lb (93 kg)   BMI 27.80 kg/m   Physical  Exam HENT:     Head: Normocephalic and atraumatic.     Mouth/Throat:     Mouth: Mucous membranes are dry.  Eyes:     Extraocular Movements: Extraocular movements intact.     Pupils: Pupils are equal, round, and reactive to light.  Pulmonary:     Effort: Pulmonary effort is normal. No respiratory distress.  Musculoskeletal:     Comments: Gait is antalgic.  Decreased lumbar flexion.  Positive bilateral lumbar paraspinal tenderness.  Positive bilateral static notch tenderness.  Positive right straight leg raise.  Negative straight leg raise on the left.  Negative logroll bilateral hips.  Neurovas intact.  No focal motor deficits.  Skin:    General: Skin is warm and dry.  Neurological:     Mental Status: He is alert and oriented to person, place, and time.  Psychiatric:        Mood and Affect: Mood normal.     Ortho Exam  Specialty Comments:  No specialty comments available.  Imaging: No results found.   PMFS History: Patient Active Problem List   Diagnosis Date Noted  . Right knee pain 07/05/2012  . Nicotine addiction 12/01/2011  . GERD 09/17/2008  . OTHER SEBORRHEIC DERMATITIS 09/17/2008  . HYPERHIDROSIS 09/17/2008   History reviewed. No pertinent past medical history.  Family History  Problem Relation Age of Onset  . Cancer Mother   . Healthy Father     Past Surgical History:  Procedure Laterality Date  . VENTRAL HERNIA REPAIR N/A 08/07/2018   Procedure: HERNIA REPAIR umblical;  Surgeon: Clovis Riley, MD;  Location: [facility];  Service: General;  Laterality: N/A;   Social History   Occupational History  . Not on file  Tobacco Use  . Smoking status: Current Every Day Smoker    Packs/day: 0.05    Types: Cigarettes  . Smokeless tobacco: Current User  Substance and Sexual Activity  . Alcohol use: Yes    Comment: Socially  . Drug use: Yes    Types: Marijuana  . Sexual activity: Yes

## 2019-07-13 NOTE — Telephone Encounter (Signed)
Per Zonia Kief. Patient need 2 IM injections. 1 Inj Depo 80 1 inj Toradol 30. Depo 80 (1cc) given in left buttock and Toradol 30 (1cc) given in right buttock. Patient tolerated well.

## 2019-08-11 ENCOUNTER — Ambulatory Visit (INDEPENDENT_AMBULATORY_CARE_PROVIDER_SITE_OTHER): Payer: No Typology Code available for payment source | Admitting: Specialist

## 2019-08-11 ENCOUNTER — Other Ambulatory Visit: Payer: Self-pay

## 2019-08-11 ENCOUNTER — Encounter: Payer: Self-pay | Admitting: Specialist

## 2019-08-11 ENCOUNTER — Ambulatory Visit: Payer: Self-pay

## 2019-08-11 VITALS — BP 136/88 | HR 76 | Ht 72.0 in | Wt 210.0 lb

## 2019-08-11 DIAGNOSIS — M5126 Other intervertebral disc displacement, lumbar region: Secondary | ICD-10-CM | POA: Diagnosis not present

## 2019-08-11 DIAGNOSIS — M4200 Juvenile osteochondrosis of spine, site unspecified: Secondary | ICD-10-CM | POA: Diagnosis not present

## 2019-08-11 DIAGNOSIS — M546 Pain in thoracic spine: Secondary | ICD-10-CM | POA: Diagnosis not present

## 2019-08-11 MED ORDER — DICLOFENAC SODIUM 50 MG PO TBEC: 50.0000 mg | DELAYED_RELEASE_TABLET | Freq: Two times a day (BID) | ORAL | 3 refills | Status: DC

## 2019-08-11 MED ORDER — GABAPENTIN 100 MG PO CAPS
ORAL_CAPSULE | ORAL | 0 refills | Status: DC
Start: 2019-08-11 — End: 2020-05-14

## 2019-08-11 MED ORDER — TRAMADOL HCL 50 MG PO TABS: 100.0000 mg | ORAL_TABLET | Freq: Four times a day (QID) | ORAL | 0 refills | Status: DC | PRN

## 2019-08-11 NOTE — Patient Instructions (Signed)
Avoid frequent bending and stooping  No lifting greater than 10 lbs. May use ice or moist heat for pain. Weight loss is of benefit. Best medication for lumbar disc disease is arthritis medications like motrin, celebrex and naprosyn. Exercise is important to improve your indurance and does allow people to function better inspite of back pain.  Referral to [facility] for second opinion concerning consideration of disc arthroplasty for disc herniations L4-5 and L5-S1 In the face of lumbar scheuermann's disease.  Tramadol for more severe back pain. Diclofenac for anti inflamatory pain control and sciatica. Gabapentin should be continued.

## 2019-08-11 NOTE — Progress Notes (Addendum)
Office Visit Note   Patient: Joseph Vazquez           Date of Birth: 12-02-1990           MRN: [id number] Visit Date: 08/11/2019              Requested by: Deatra Kimberly Coye, MD [phone] [place],  [place] [address] PCP: Deatra Garielle Mroz, MD   Assessment & Plan: Visit Diagnoses:  1. Scheurmann's disease   2. Pain in thoracic spine   3. Lumbar disc herniation   29 year old male with history of back pain and sciatica, more recently with some extension into the thoracic area. He functions at a high Level and is able to go to pool parties, work 40 hour work week and do yard maintenance. He has persistent back pain that is constant  At level 3-4 but can range into 9-10 level. His findings on recent MRIs show central disc protrusions at L4-5 and L5-S1 with impression on the expected  L5 and S1 nerve roots at these levels. He is chronically requiring tylenol#3 for pain and is taking intermittant gabapentin that he Acquires from his father who has chronic DDD of the lumbar spine and multiple lumbar surgeries. He is seen today and again is found to Be functioning at a high level, there is some pain with right SLR, and mild EHL weakness right and left. We discussed findings that suggest  Diffuse changes of the lumbar spine probably due to scheurermann's disease of the lumbar spine. I suspect the thoracic pain is of a similar origin. I will continue on gabapentin with his own Rx. And try tramadol for more significant pain, warned against taking maximum of these meds due to potential for seratonin syndrome. In addition start voltaren 50 mg 2-3 times per day. I will request an evaluation at [facility] by Dr. Wyline Mood for consideration of disc arthroplasties of L4-5 and L5-S1 due to the presence of nerve compression at L4-5 and L5-S1 due to disc herniation in a young patient with multiple level changes of lumbar scheuermann's disease. I believe this may offer a solution for  Relieving fairly significant  central disc protrusions with nerve compression and allow continued ROM of the spine perhaps decreasing risk For adjacent future disc changes that would result in more surgery in the future if fusion or laminectomy were considered.   Plan:Avoid frequent bending and stooping  No lifting greater than 10 lbs. May use ice or moist heat for pain. Weight loss is of benefit. Best medication for lumbar disc disease is arthritis medications like motrin, celebrex and naprosyn. Exercise is important to improve your indurance and does allow people to function better inspite of back pain.  Referral to [facility] for second opinion concerning consideration of disc arthroplasty for disc herniations L4-5 and L5-S1 In the face of lumbar scheuermann's disease.  Tramadol for more severe back pain. Diclofenac for anti inflamatory pain control and sciatica. Gabapentin should be continued.   Follow-Up Instructions: Return in about 3 months (around 11/11/2019).   Orders:  Orders Placed This Encounter  Procedures  . XR Thoracic Spine 2 View  . Ambulatory referral to Neurosurgery   Meds ordered this encounter  Medications  . traMADol (ULTRAM) 50 MG tablet    Sig: Take 2 tablets (100 mg total) by mouth every 6 (six) hours as needed for moderate pain.    Dispense:  40 tablet    Refill:  0  . diclofenac (VOLTAREN)  50 MG EC tablet    Sig: Take 1 tablet (50 mg total) by mouth 2 (two) times daily.    Dispense:  60 tablet    Refill:  3      Procedures: No procedures performed   Clinical Data: Findings:  Narrative & Impression CLINICAL DATA:  Spondylolisthesis. Left lower extremity radiculopathy  EXAM: MRI LUMBAR SPINE WITHOUT CONTRAST  TECHNIQUE: Multiplanar, multisequence MR imaging of the lumbar spine was performed. No intravenous contrast was administered.  COMPARISON:  CT myelogram 07/30/2015  FINDINGS: Segmentation:  5 lumbar type vertebral bodies by radiography  Alignment:   Normal  Vertebrae:  No fracture, evidence of discitis, or bone lesion.  Conus medullaris and cauda equina: Conus extends to the L1 level. Conus and cauda equina appear normal.  Paraspinal and other soft tissues: Negative  Disc levels:  T12- L1: Unremarkable.  L1-L2: Minor annulus bulging  L2-L3: Minor annulus bulging  L3-L4: Minor annulus bulging  L4-L5: Disc narrowing and endplate degeneration with central protrusion compressing the L5 nerve roots in the subarticular recesses. Spinal stenosis is mild borderline moderate. The foramina are patent  L5-S1:Disc narrowing and desiccation with left eccentric disc protrusion compressing the left S1 nerve root. Disc also contacts the right S1 nerve root without compression. Patent foramina  IMPRESSION: 1. L4-5 disc degeneration with protrusion compressing the left more than right L5 nerve roots in the subarticular recesses. 2. L5-S1 disc degeneration with left eccentric disc protrusion impinging on the left S1 nerve root.   Electronically Signed   By: Marnee Spring M.D.   On: 10/24/2017 09:04       Subjective: Chief Complaint  Patient presents with  . 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 There he does outside walkways and concrete curbs and paper walk ways. 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 He has had recent surgery 6/28 removal of a  Suture for previous repair of umbilical hernia due to incarcerated hernia one year ago when he had surgery for infected incarcerated small umbilical hernia.He has taken tylenol for pain for the hernia surgery. Was given an Rx for hydrocodone that he did not use. He is not having any bowel or bladder difficulty. He can walk a mile. His back is  painful up to an 8-9 on worst days and a 3-4 when not as painful. The discomfort is more frequent with feeling of grinding sensation in the spine. Pain is worse with bending stooping and lifting and riding long distances. He has difficulty straightening up with more spasm recently. There is some pain with cough or sneeze and feels like he could  Collapse due to pain.  Presently taking just gabapentin which helps nerve pain. Not taking more than tylenol but it doesn't help much.  Its just there now, feels like the upper back is worse due to overcompensation.    Review of Systems  Constitutional: Positive for activity change. Negative for appetite change, chills, diaphoresis, fatigue, fever and unexpected weight change.  HENT: Positive for congestion, sinus pressure and sinus pain. Negative for dental problem, drooling, ear discharge, ear pain, facial swelling, hearing loss, mouth sores, nosebleeds, postnasal drip, rhinorrhea, sneezing, sore throat, tinnitus, trouble swallowing and voice change.   Eyes: Negative.  Negative for  photophobia, pain, discharge, redness, itching and visual disturbance.  Respiratory: Negative.  Negative for apnea, cough, choking, chest tightness, shortness of breath and stridor.   Cardiovascular: Negative.  Negative for chest pain, palpitations and leg swelling.  Gastrointestinal: Negative.  Negative for abdominal distention, abdominal pain, anal bleeding, blood in stool, constipation, diarrhea, nausea, rectal pain and vomiting.  Endocrine: Negative.  Negative for cold intolerance, heat intolerance, polydipsia, polyphagia and polyuria.  Genitourinary: Negative.  Negative for difficulty urinating, dysuria, enuresis, flank pain, frequency, genital sores, hematuria and urgency.  Musculoskeletal: Positive for back pain. Negative for arthralgias, gait problem, joint swelling, myalgias, neck pain and neck stiffness.  Skin: Negative.  Negative for color change, pallor, rash and  wound.  Allergic/Immunologic: Negative.  Negative for environmental allergies, food allergies and immunocompromised state.  Neurological: Positive for weakness and numbness (right lateral calf and toes and hamstring). Negative for dizziness, tremors, seizures, syncope, facial asymmetry, speech difficulty, light-headedness and headaches.  Hematological: Negative.  Negative for adenopathy.  Psychiatric/Behavioral: Negative.  Negative for agitation, behavioral problems, confusion, decreased concentration, dysphoric mood, hallucinations, self-injury, sleep disturbance and suicidal ideas. The patient is not nervous/anxious and is not hyperactive.      Objective: Vital Signs: BP 136/88   Pulse 76   Ht 6' (1.829 m)   Wt 210 lb (95.3 kg)   BMI 28.48 kg/m   Physical Exam Constitutional:      Appearance: He is well-developed.  HENT:     Head: Normocephalic and atraumatic.  Eyes:     Pupils: Pupils are equal, round, and reactive to light.  Pulmonary:     Effort: Pulmonary effort is normal.     Breath sounds: Normal breath sounds.  Abdominal:     General: Bowel sounds are normal.     Palpations: Abdomen is soft.  Musculoskeletal:        General: Normal range of motion.     Cervical back: Normal range of motion and neck supple.  Skin:    General: Skin is warm and dry.  Neurological:     Mental Status: He is alert and oriented to person, place, and time.  Psychiatric:        Behavior: Behavior normal.        Thought Content: Thought content normal.        Judgment: Judgment normal.     Ortho Exam  Specialty Comments:  No specialty comments available.  Imaging: No results found.   PMFS History: Patient Active Problem List   Diagnosis Date Noted  . Right knee pain 07/05/2012  . Nicotine addiction 12/01/2011  . GERD 09/17/2008  . OTHER SEBORRHEIC DERMATITIS 09/17/2008  . HYPERHIDROSIS 09/17/2008   History reviewed. No pertinent past medical history.  Family History   Problem Relation Age of Onset  . Cancer Mother   . Healthy Father     Past Surgical History:  Procedure Laterality Date  . VENTRAL HERNIA REPAIR N/A 08/07/2018   Procedure: HERNIA REPAIR umblical;  Surgeon: Berna Bue, MD;  Location: [facility];  Service: General;  Laterality: N/A;   Social History   Occupational History  . Not on file  Tobacco Use  . Smoking status: Current Every Day Smoker    Packs/day: 0.05    Types: Cigarettes  . Smokeless tobacco: Current User  Vaping Use  . Vaping Use: Never used  Substance and Sexual Activity  . Alcohol use: Yes    Comment: Socially  . Drug use: Yes    Types: Marijuana  .  Sexual activity: Yes

## 2019-09-08 ENCOUNTER — Encounter: Payer: Self-pay | Admitting: Specialist

## 2019-09-11 NOTE — Telephone Encounter (Signed)
[facility] neurosurgery dept left message for status of referral. P- [phone]

## 2019-09-12 ENCOUNTER — Telehealth: Payer: Self-pay | Admitting: Specialist

## 2019-09-12 NOTE — Telephone Encounter (Signed)
I called and lmom that we have tried to reach out to [facility] and they have not responded to that message as of yet, we called yesterday 09/11/19

## 2019-09-12 NOTE — Telephone Encounter (Signed)
Pt called stating a referral for another DR. was supposed to be put in for him but then he never heard anything back. Pt would like a CB   [phone]

## 2019-11-06 ENCOUNTER — Ambulatory Visit
Admission: EM | Admit: 2019-11-06 | Discharge: 2019-11-06 | Disposition: A | Discharge status: Home / Self Care | Payer: No Typology Code available for payment source | Attending: Physician Assistant | Admitting: Physician Assistant

## 2019-11-06 ENCOUNTER — Other Ambulatory Visit: Payer: Self-pay

## 2019-11-06 DIAGNOSIS — Z1152 Encounter for screening for COVID-19: Secondary | ICD-10-CM | POA: Diagnosis not present

## 2019-11-06 DIAGNOSIS — J209 Acute bronchitis, unspecified: Secondary | ICD-10-CM

## 2019-11-06 MED ORDER — ALBUTEROL SULFATE HFA 108 (90 BASE) MCG/ACT IN AERS
2.0000 | INHALATION_SPRAY | Freq: Once | RESPIRATORY_TRACT | Status: AC
  Administered 2019-11-06: 2 via RESPIRATORY_TRACT

## 2019-11-06 MED ORDER — DOXYCYCLINE HYCLATE 100 MG PO CAPS: 100.0000 mg | ORAL_CAPSULE | Freq: Two times a day (BID) | ORAL | 0 refills | Status: DC

## 2019-11-06 MED ORDER — PREDNISONE 50 MG PO TABS: 50.0000 mg | ORAL_TABLET | Freq: Every day | ORAL | 0 refills | Status: DC

## 2019-11-06 NOTE — ED Triage Notes (Signed)
Pt c/o cough, wheezing, and SOB x2 wks. States had nasal congestion that has subsided.

## 2019-11-06 NOTE — ED Provider Notes (Signed)
[name]    CSN: [id number] Arrival date & time: 11/06/19  1542      History   Chief Complaint Chief Complaint  Patient presents with   Cough    HPI Leighton Brickley is a 29 y.o. male.   28 year old male comes in for 2 week history of cough, wheezing, shortness of breath. Had nasal congestion that has resolved. Cough is nonproductive, worse at night. Wheezing that is also worse at night. Shortness of breath with chest tightness, feels like he is unable to take deep breaths. Denies fevers. Denies abdominal pain, nausea, vomiting, diarrhea. Denies loss of taste/smell. Denies history of asthma. Current every day smoker, 1ppd, 10+ year. Has had to use inhalers as a child. mucinex without relief.      History reviewed. No pertinent past medical history.  Patient Active Problem List   Diagnosis Date Noted   Right knee pain 07/05/2012   Nicotine addiction 12/01/2011   GERD 09/17/2008   OTHER SEBORRHEIC DERMATITIS 09/17/2008   HYPERHIDROSIS 09/17/2008    Past Surgical History:  Procedure Laterality Date   VENTRAL HERNIA REPAIR N/A 08/07/2018   Procedure: HERNIA REPAIR umblical;  Surgeon: Berna Bue, MD;  Location: [facility];  Service: General;  Laterality: N/A;       Home Medications    Prior to Admission medications   Medication Sig Start Date End Date Taking? Authorizing Provider  doxycycline (VIBRAMYCIN) 100 MG capsule Take 1 capsule (100 mg total) by mouth 2 (two) times daily. 11/06/19   Cathie Hoops, Shanon Becvar V, PA-C  gabapentin (NEURONTIN) 100 MG capsule Take 2 capsules (200 mg total) by mouth at bedtime for 4 days, THEN 2 capsules (200 mg total) 2 (two) times daily for 4 days, THEN 2 capsules (200 mg total) 3 (three) times daily for 22 days. 08/11/19 09/10/19  Kerrin Champagne, MD  gabapentin (NEURONTIN) 300 MG capsule Take 300 mg by mouth 2 (two) times daily as needed (nerve pain).    [provider]  methocarbamol (ROBAXIN) 500 MG tablet Take 1 tablet (500  mg total) by mouth every 8 (eight) hours as needed for muscle spasms. 07/13/19   Naida Sleight, PA-C  predniSONE (DELTASONE) 50 MG tablet Take 1 tablet (50 mg total) by mouth daily with breakfast. 11/06/19   Belinda Fisher, PA-C    Family History Family History  Problem Relation Age of Onset   Cancer Mother    Healthy Father     Social History Social History   Tobacco Use   Smoking status: Current Every Day Smoker    Packs/day: 0.05    Types: Cigarettes   Smokeless tobacco: Current User  Vaping Use   Vaping Use: Never used  Substance Use Topics   Alcohol use: Yes    Comment: Socially   Drug use: Yes    Types: Marijuana     Allergies   Patient has no known allergies.   Review of Systems Review of Systems  Reason unable to perform ROS: See HPI as above.     Physical Exam Triage Vital Signs ED Triage Vitals  Enc Vitals Group     BP 11/06/19 1614 (!) 152/89     Pulse Rate 11/06/19 1614 73     Resp 11/06/19 1614 18     Temp 11/06/19 1614 97.7 F (36.5 C)     Temp Source 11/06/19 1614 Oral     SpO2 11/06/19 1614 98 %     Weight --  Height --      Head Circumference --      Peak Flow --      Pain Score 11/06/19 1626 0     Pain Loc --      Pain Edu? --      Excl. in GC? --    No data found.  Updated Vital Signs BP (!) 152/89 (BP Location: Right Arm)    Pulse 73    Temp 97.7 F (36.5 C) (Oral)    Resp 18    SpO2 98%   Physical Exam Constitutional:      General: He is not in acute distress.    Appearance: Normal appearance. He is not ill-appearing, toxic-appearing or diaphoretic.  HENT:     Head: Normocephalic and atraumatic.     Mouth/Throat:     Mouth: Mucous membranes are moist.     Pharynx: Oropharynx is clear. Uvula midline.  Cardiovascular:     Rate and Rhythm: Normal rate and regular rhythm.     Heart sounds: Normal heart sounds. No murmur heard.  No friction rub. No gallop.   Pulmonary:     Effort: Pulmonary effort is normal. No  accessory muscle usage, prolonged expiration, respiratory distress or retractions.     Comments: Inspiratory wheezing to the periphery. Good air movement. No rhonchi or rales.  Albuterol 2 puffs x 1: LCTAB Musculoskeletal:     Cervical back: Normal range of motion and neck supple.  Skin:    General: Skin is warm and dry.  Neurological:     General: No focal deficit present.     Mental Status: He is alert and oriented to person, place, and time.    UC Treatments / Results  Labs (all labs ordered are listed, but only abnormal results are displayed) Labs Reviewed  NOVEL CORONAVIRUS, NAA    EKG   Radiology No results found.  Procedures Procedures (including critical care time)  Medications Ordered in UC Medications  albuterol (VENTOLIN HFA) 108 (90 Base) MCG/ACT inhaler 2 puff (has no administration in time range)    Initial Impression / Assessment and Plan / UC Course  I have reviewed the triage vital signs and the nursing notes.  Pertinent labs & imaging results that were available during my care of the patient were reviewed by me and considered in my medical decision making (see chart for details).    COVID testing ordered. Patient with stable vitals. Initially with inspiratory wheezing in the periphery, resolved with 2 puffs albuterol. 10+ pack year history. Will treat for bronchitis with prednisone and doxycycline. Smoking cessation discussed. Return precautions given.   Final Clinical Impressions(s) / UC Diagnoses   Final diagnoses:  Encounter for screening for COVID-19  Acute bronchitis, unspecified organism    ED Prescriptions    Medication Sig Dispense Auth. Provider   doxycycline (VIBRAMYCIN) 100 MG capsule Take 1 capsule (100 mg total) by mouth 2 (two) times daily. 10 capsule Mireya Meditz V, PA-C   predniSONE (DELTASONE) 50 MG tablet Take 1 tablet (50 mg total) by mouth daily with breakfast. 5 tablet Belinda Fisher, PA-C     PDMP not reviewed this encounter.     Belinda Fisher, PA-C 11/06/19 1655

## 2019-11-06 NOTE — Discharge Instructions (Signed)
COVID PCR testing ordered. Albuterol 2 puffs every 4-6 hours as needed for shortness of breath/wheezing/ cough. Use 2 puffs before bedtime. Start prednisone and doxycycline as directed. Tylenol/motrin for pain and fever. Keep hydrated, urine should be clear to pale yellow in color. If experiencing worsening shortness of breath, trouble breathing, go to the emergency department for further evaluation needed.

## 2019-11-07 LAB — SARS-COV-2, NAA 2 DAY TAT

## 2019-11-07 LAB — NOVEL CORONAVIRUS, NAA: SARS-CoV-2, NAA: NOT DETECTED

## 2020-03-17 IMAGING — MR MR LUMBAR SPINE W/O CM
5 series · 47 of 48 positions shown · non-contrast
Comparison: CT myelogram 07/30/2015

CLINICAL DATA: Spondylolisthesis. Left lower extremity
radiculopathy

EXAM:
MRI LUMBAR SPINE WITHOUT CONTRAST
TECHNIQUE: Multiplanar, multisequence MR imaging of the lumbar spine was
performed. No intravenous contrast was administered.

[Series 3: T2 post-contrast · sagittal · 4.0mm · 0.88mm/px · 6 of 12 slices shown]
[im 1/12]
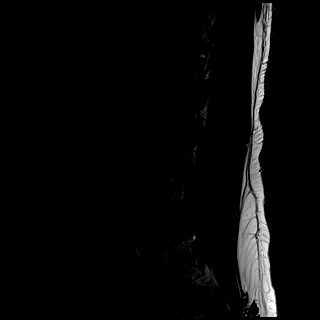
[im 3/12]
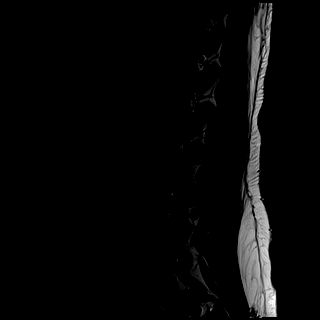
[im 5/12]
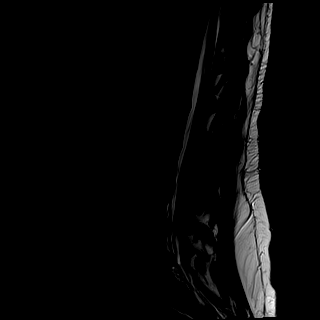
[im 7/12]
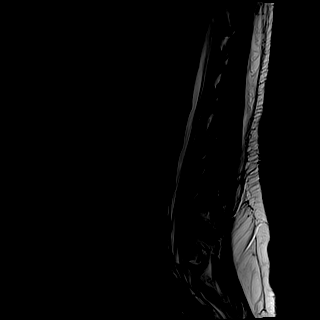
[im 9/12]
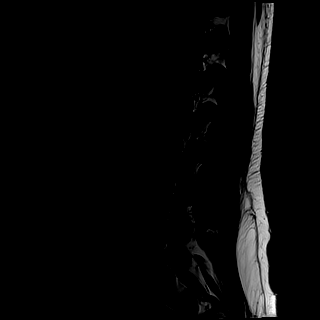
[im 12/12]
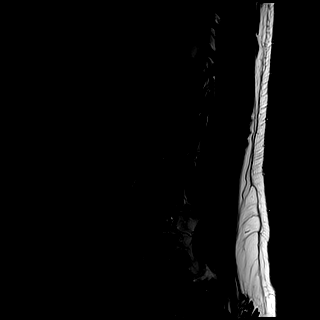

[Series 4: T1 · sagittal · 4.0mm · 0.88mm/px · 5 of 12 slices shown (1 of 2)]
[im 1/12]
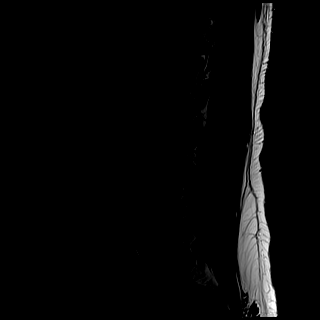
[im 3/12]
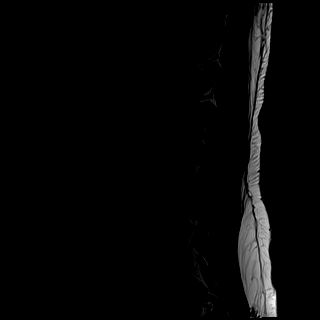
[im 6/12]
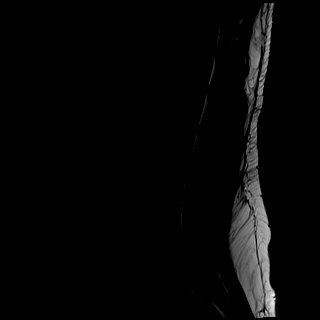
[im 9/12]
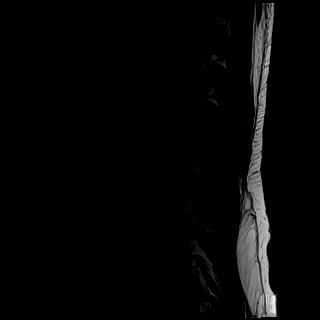
[im 12/12]
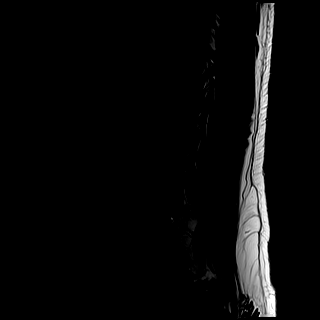

[Series 5: tirm sag · sagittal · 4.0mm · 0.55mm/px · 5 of 12 slices shown]
[im 1/12]
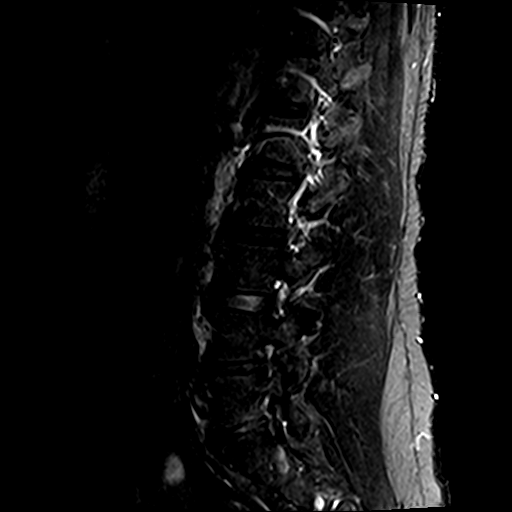
[im 3/12]
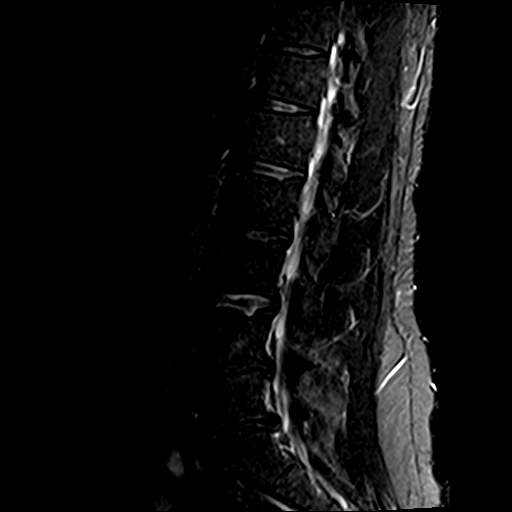
[im 6/12]
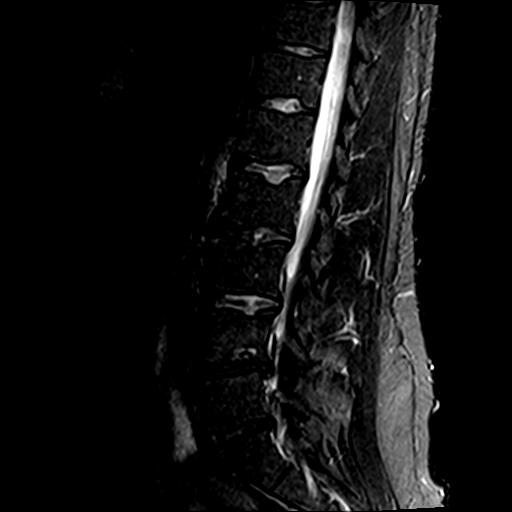
[im 9/12]
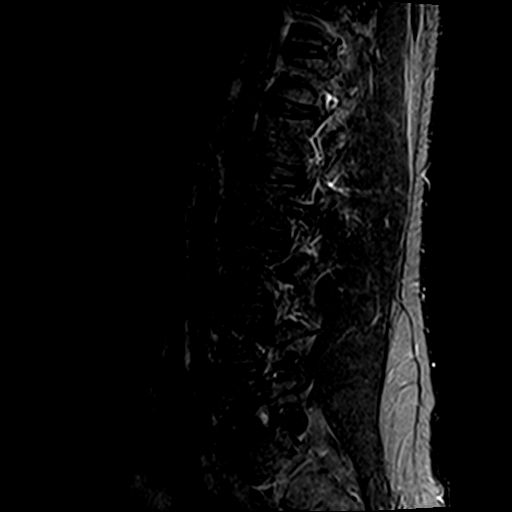
[im 12/12]
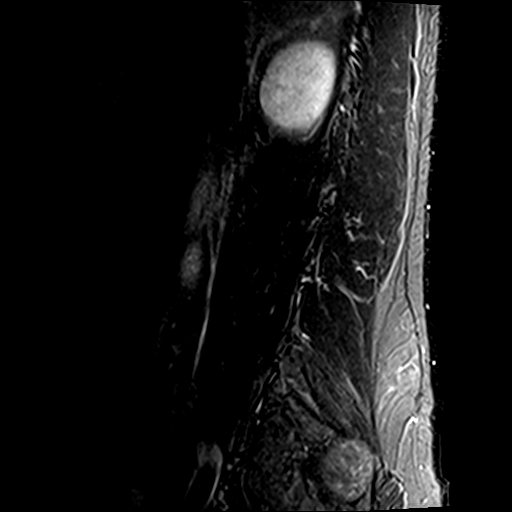

[Series 7: T2 · axial · 4.0mm · 0.78mm/px · z∈[-107,+103]mm · 16 of 38 slices shown]
[im 1/38]
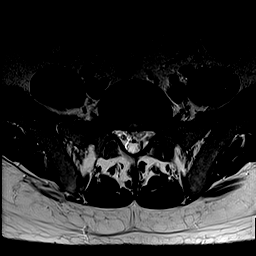
[im 3/38]
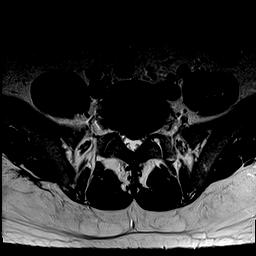
[im 5/38]
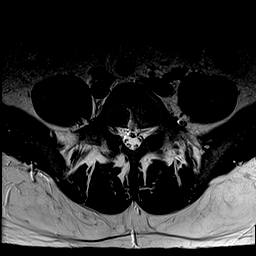
[im 8/38]
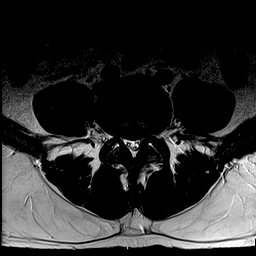
[im 10/38]
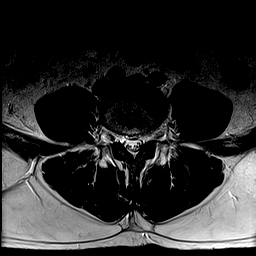
[im 13/38]
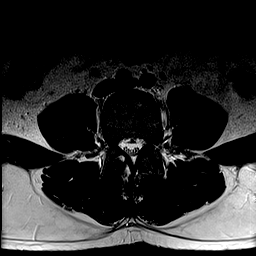
[im 15/38]
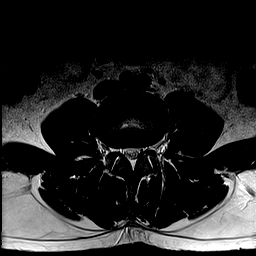
[im 18/38]
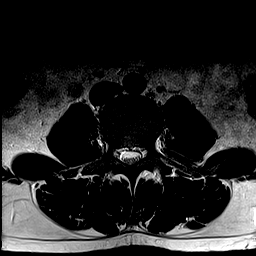
[im 20/38]
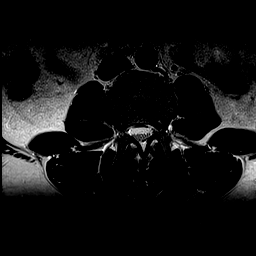
[im 23/38]
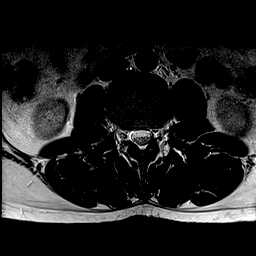
[im 25/38]
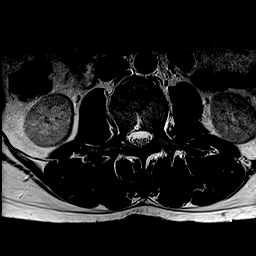
[im 28/38]
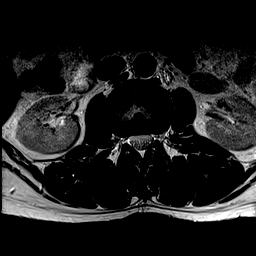
[im 30/38]
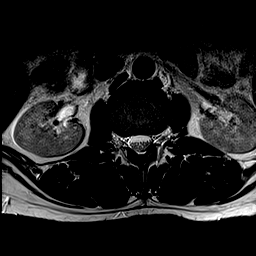
[im 33/38]
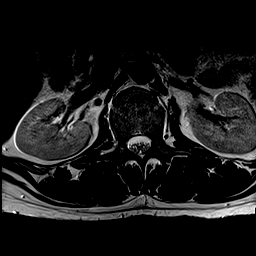
[im 35/38]
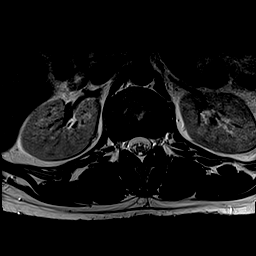
[im 38/38]
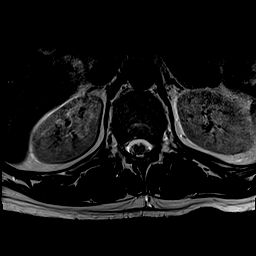

[Series 8: T1 · axial · 4.0mm · 0.78mm/px · z∈[-107,+103]mm · 15 of 38 slices shown (2 of 2)]
[im 1/38]
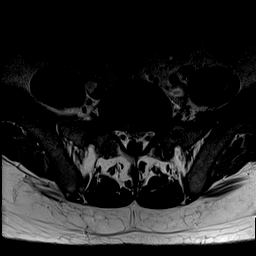
[im 3/38]
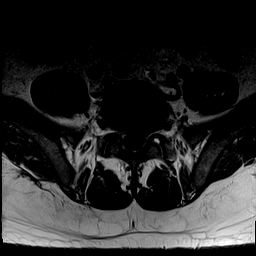
[im 5/38]
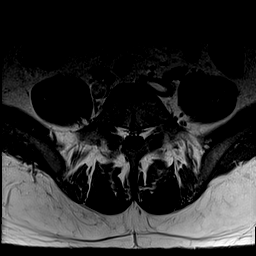
[im 8/38]
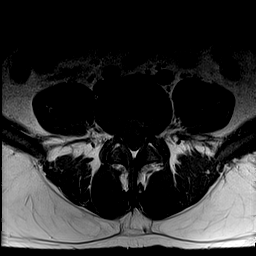
[im 10/38]
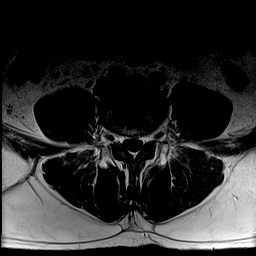
[im 13/38]
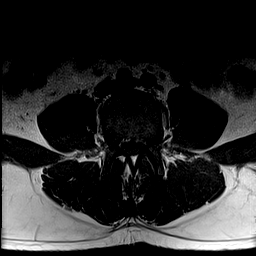
[im 15/38]
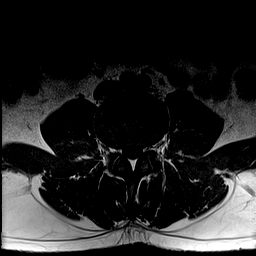
[im 18/38]
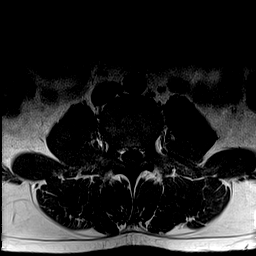
[im 20/38]
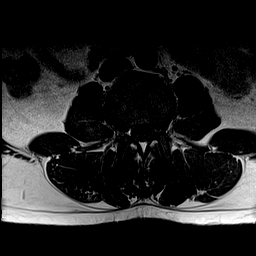
[im 23/38]
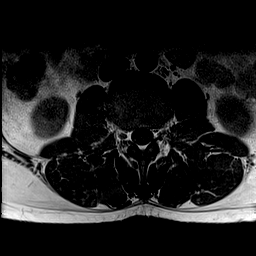
[im 25/38]
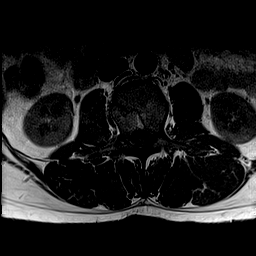
[im 28/38]
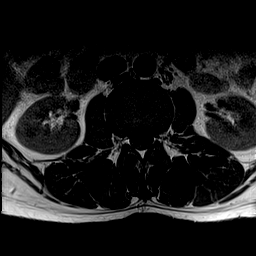
[im 30/38]
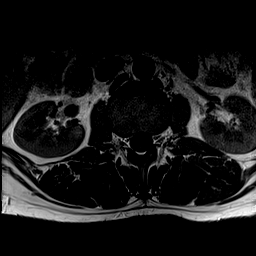
[im 33/38]
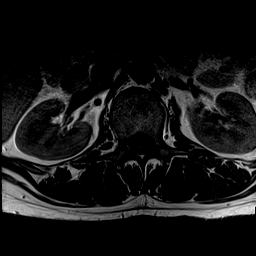
[im 38/38]
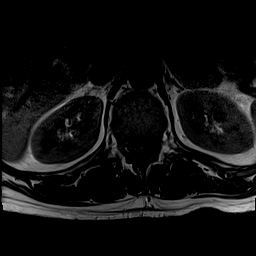

[47 of 48 positions shown; findings below may reference images not displayed]

FINDINGS: Segmentation:  5 lumbar type vertebral bodies by radiography

Alignment:  Normal

Vertebrae:  No fracture, evidence of discitis, or bone lesion.

Conus medullaris and cauda equina: Conus extends to the L1 level.
Conus and cauda equina appear normal.

Paraspinal and other soft tissues: Negative

Disc levels:

T12- L1: Unremarkable.

L1-L2: Minor annulus bulging

L2-L3: Minor annulus bulging

L3-L4: Minor annulus bulging

L4-L5: Disc narrowing and endplate degeneration with central
protrusion compressing the L5 nerve roots in the subarticular
recesses. Spinal stenosis is mild borderline moderate. The foramina
are patent

L5-S1:Disc narrowing and desiccation with left eccentric disc
protrusion compressing the left S1 nerve root. Disc also contacts
the right S1 nerve root without compression. Patent foramina
IMPRESSION: 1. L4-5 disc degeneration with protrusion compressing the left more
than right L5 nerve roots in the subarticular recesses.
2. L5-S1 disc degeneration with left eccentric disc protrusion
impinging on the left S1 nerve root.

## 2020-03-20 ENCOUNTER — Encounter: Payer: Self-pay | Admitting: Specialist

## 2020-03-27 ENCOUNTER — Ambulatory Visit (INDEPENDENT_AMBULATORY_CARE_PROVIDER_SITE_OTHER): Payer: No Typology Code available for payment source | Admitting: Specialist

## 2020-03-27 ENCOUNTER — Encounter: Payer: Self-pay | Admitting: Specialist

## 2020-03-27 ENCOUNTER — Ambulatory Visit: Payer: Self-pay

## 2020-03-27 ENCOUNTER — Other Ambulatory Visit: Payer: Self-pay

## 2020-03-27 VITALS — BP 133/83 | HR 78 | Ht 72.0 in | Wt 210.0 lb

## 2020-03-27 DIAGNOSIS — M4807 Spinal stenosis, lumbosacral region: Secondary | ICD-10-CM

## 2020-03-27 DIAGNOSIS — M5126 Other intervertebral disc displacement, lumbar region: Secondary | ICD-10-CM | POA: Diagnosis not present

## 2020-03-27 DIAGNOSIS — M542 Cervicalgia: Secondary | ICD-10-CM

## 2020-03-27 DIAGNOSIS — M5136 Other intervertebral disc degeneration, lumbar region: Secondary | ICD-10-CM

## 2020-03-27 MED ORDER — TIZANIDINE HCL 4 MG PO TABS: 4.0000 mg | ORAL_TABLET | Freq: Four times a day (QID) | ORAL | 0 refills | Status: DC | PRN

## 2020-03-27 MED ORDER — TRAMADOL HCL ER 100 MG PO TB24: 100.0000 mg | ORAL_TABLET | Freq: Every day | ORAL | 0 refills | Status: AC

## 2020-03-27 MED ORDER — TRAMADOL HCL ER 100 MG PO TB24: 100.0000 mg | ORAL_TABLET | Freq: Every day | ORAL | 0 refills | Status: DC

## 2020-03-27 NOTE — Progress Notes (Signed)
Office Visit Note   Patient: Joseph Vazquez           Date of Birth: March 20, 1990           MRN: [id number] Visit Date: 03/27/2020              Requested by: Deatra James, MD [phone] [address] [place],  [place] [address] PCP: Deatra James, MD   Assessment & Plan: Visit Diagnoses:  1. Cervicalgia   2. Spinal stenosis of lumbosacral region     Plan: Avoid frequent bending and stooping  No lifting greater than 10 lbs. May use ice or moist heat for pain. Weight loss is of benefit. Best medication for lumbar disc disease is arthritis medications like motrin, celebrex and naprosyn. Exercise is important to improve your indurance and does allow people to function better inspite of back pain.  Tramadol ER 100 mg per day Tizanidine 4 mg po every 4 hours for spasm.   Follow-Up Instructions: Return in about 3 weeks (around 04/17/2020).   Orders:  Orders Placed This Encounter  Procedures  . XR Cervical Spine 2 or 3 views  . MR Lumbar Spine w/o contrast   Meds ordered this encounter  Medications  . traMADol (ULTRAM-ER) 100 MG 24 hr tablet    Sig: Take 1 tablet (100 mg total) by mouth daily.    Dispense:  14 tablet    Refill:  0  . tiZANidine (ZANAFLEX) 4 MG tablet    Sig: Take 1 tablet (4 mg total) by mouth every 6 (six) hours as needed for muscle spasms.    Dispense:  30 tablet    Refill:  0      Procedures: No procedures performed   Clinical Data: No additional findings.   Subjective: Chief Complaint  Patient presents with  . Lower Back - Pain    30 year old male with back pain and pain into the arms and the legs. He is a steady "6" on scale of one to ten with pain that causes him to stop and have his friend massage the areas under the shoulder blades. He feels like he threw th back out the other day and has had spasm. Has pain in the left hip and into the left groin. Also between the shoudler blades he is grinding. In the pain he could cope well and be able to  control the discomfort. Has tried CBD, has used hot showers and ice and lying on a device helps to arch the back.    Review of Systems   Objective: Vital Signs: BP 133/83   Pulse 78   Ht 6' (1.829 m)   Wt 210 lb (95.3 kg)   BMI 28.48 kg/m   Physical Exam  Ortho Exam  Specialty Comments:  No specialty comments available.  Imaging: No results found.   PMFS History: Patient Active Problem List   Diagnosis Date Noted  . Right knee pain 07/05/2012  . Nicotine addiction 12/01/2011  . GERD 09/17/2008  . OTHER SEBORRHEIC DERMATITIS 09/17/2008  . HYPERHIDROSIS 09/17/2008   History reviewed. No pertinent past medical history.  Family History  Problem Relation Age of Onset  . Cancer Mother   . Healthy Father     Past Surgical History:  Procedure Laterality Date  . VENTRAL HERNIA REPAIR N/A 08/07/2018   Procedure: HERNIA REPAIR umblical;  Surgeon: Berna Bue, MD;  Location: [facility];  Service: General;  Laterality: N/A;   Social History   Occupational  History  . Not on file  Tobacco Use  . Smoking status: Current Every Day Smoker    Packs/day: 0.05    Types: Cigarettes  . Smokeless tobacco: Current User  Vaping Use  . Vaping Use: Never used  Substance and Sexual Activity  . Alcohol use: Yes    Comment: Socially  . Drug use: Yes    Types: Marijuana  . Sexual activity: Yes

## 2020-03-27 NOTE — Patient Instructions (Signed)
Avoid frequent bending and stooping  No lifting greater than 10 lbs. May use ice or moist heat for pain. Weight loss is of benefit. Best medication for lumbar disc disease is arthritis medications like motrin, celebrex and naprosyn. Exercise is important to improve your indurance and does allow people to function better inspite of back pain.  Tramadol ER 100 mg per day Tizanidine 4 mg po every 4 hours for spasm.

## 2020-03-27 NOTE — Addendum Note (Signed)
Addended byDurene Cal, Mckinnley Cottier L on: 03/27/2020 01:06 PM   Modules accepted: Orders

## 2020-04-10 ENCOUNTER — Ambulatory Visit
Admission: RE | Admit: 2020-04-10 | Discharge: 2020-04-10 | Disposition: A | Discharge status: Home / Self Care | Payer: No Typology Code available for payment source | Source: Ambulatory Visit | Attending: Specialist | Admitting: Specialist

## 2020-04-10 ENCOUNTER — Other Ambulatory Visit: Payer: Self-pay

## 2020-04-10 DIAGNOSIS — M4807 Spinal stenosis, lumbosacral region: Secondary | ICD-10-CM

## 2020-04-12 ENCOUNTER — Encounter: Payer: Self-pay | Admitting: Specialist

## 2020-04-25 ENCOUNTER — Encounter: Payer: Self-pay | Admitting: Specialist

## 2020-04-25 ENCOUNTER — Other Ambulatory Visit: Payer: Self-pay

## 2020-04-25 ENCOUNTER — Ambulatory Visit (INDEPENDENT_AMBULATORY_CARE_PROVIDER_SITE_OTHER): Payer: No Typology Code available for payment source | Admitting: Specialist

## 2020-04-25 VITALS — BP 136/82 | HR 77 | Ht 72.0 in | Wt 210.0 lb

## 2020-04-25 DIAGNOSIS — M5126 Other intervertebral disc displacement, lumbar region: Secondary | ICD-10-CM

## 2020-04-25 DIAGNOSIS — M48062 Spinal stenosis, lumbar region with neurogenic claudication: Secondary | ICD-10-CM | POA: Diagnosis not present

## 2020-04-25 DIAGNOSIS — M5136 Other intervertebral disc degeneration, lumbar region: Secondary | ICD-10-CM | POA: Diagnosis not present

## 2020-04-25 MED ORDER — HYDROCODONE-ACETAMINOPHEN 7.5-325 MG PO TABS: 1.0000 | ORAL_TABLET | Freq: Four times a day (QID) | ORAL | 0 refills | Status: DC | PRN

## 2020-04-25 MED ORDER — HYDROCODONE-ACETAMINOPHEN 5-325 MG PO TABS: 1.0000 | ORAL_TABLET | Freq: Four times a day (QID) | ORAL | 0 refills | Status: DC | PRN

## 2020-04-25 NOTE — Patient Instructions (Signed)
Plan: Avoid bending, stooping and avoid lifting weights greater than 10 lbs. Avoid prolong standing and walking. Order for a new walker with wheels. Surgery scheduling secretary Tivis Ringer, will call you in the next week to schedule for surgery.  Surgery recommended is a two level lumbar fusion L4-5and L5-S1 this would be done with rods, screws and cages with local bone graft and allograft (donor bone graft). Take hydrocodone for for pain. Risk of surgery includes risk of infection 1 in 200 patients, bleeding 1/4% chance you would need a transfusion.   Risk to the nerves is one in 10,000. You will need to use a brace for 3 months and wean from the brace on the 4th month. Expect improved walking and standing tolerance. Expect relief of leg pain but numbness may persist depending on the length and degree of pressure that has been present.

## 2020-04-25 NOTE — Progress Notes (Addendum)
Office Visit Note   Patient: Joseph Vazquez           Date of Birth: 03/31/1990           MRN: [id number] Visit Date: 04/25/2020              Requested by: Deatra Ramil Edgington, MD [phone] [address] [place],  [place] [address] PCP: Deatra Alia Parsley, MD   Assessment & Plan: Visit Diagnoses:  1. Lumbar disc herniation   2. Degenerative disc disease, lumbar   3. Spinal stenosis of lumbar region with neurogenic claudication   30 year old male that I have followed for over 7 years treating disc protrusions with lateral recess stenosis  And degenerative disc disease of the lowest 2 lumbar segments. He has schmorl's nodes at nearly every  Lumbar level but the areas showing severe disc narrowing with adjacent modic changes of the vertebral body is limited to L4-5 and L5-S1. The disc at both levels are herniated and the L5-S1 posterior disc has an  Area of high intensity zone posteriorly. The L4-5 level disc is impressing on the left L5 root and the L5-S1 Disc is herniated and results in protrusion causing compression of the bilateral S1 roots. He has significant Mid lumbosacral pain as well as sciatica. With the degree of chronicity of back pain, degenerative changes with modic changes seen at the L4-5 and L5-S1 level I recommend that decompression and fusion be  Done to relieve his back and leg pain. He has been treated conservatively with PT, ESIs and use of pain medications to relieve his pain and he is at the point where he is unable to continue to work steadily due to [employer] low back pain and sciatica. I will plan to schedule him for surgical fusion and decompression at L4-5 and L5-S1. Hydrocodone for discomfort till surgery is scheduled.   Plan: Avoid bending, stooping and avoid lifting weights greater than 10 lbs. Avoid prolong standing and walking. Order for a new walker with wheels. Surgery scheduling secretary Tivis Ringer, will call you in the next week to schedule for surgery.  Surgery  recommended is a two level lumbar fusion L4-5and L5-S1 this would be done with rods, screws and cages with local bone graft and allograft (donor bone graft). Take hydrocodone for for pain. Risk of surgery includes risk of infection 1 in 200 patients, bleeding 1/4% chance you would need a transfusion.   Risk to the nerves is one in 10,000. You will need to use a brace for 3 months and wean from the brace on the 4th month. Expect improved walking and standing tolerance. Expect relief of leg pain but numbness may persist depending on the length and degree of pressure that has been present.    Follow-Up Instructions: Return in about 4 weeks (around 05/23/2020).   Orders:  No orders of the defined types were placed in this encounter.  Meds ordered this encounter  Medications  . DISCONTD: HYDROcodone-acetaminophen (NORCO/VICODIN) 5-325 MG tablet    Sig: Take 1 tablet by mouth every 6 (six) hours as needed for moderate pain.    Dispense:  30 tablet    Refill:  0  . HYDROcodone-acetaminophen (NORCO) 7.5-325 MG tablet    Sig: Take 1 tablet by mouth every 6 (six) hours as needed for moderate pain.    Dispense:  30 tablet    Refill:  0      Procedures: No procedures performed   Clinical Data: Findings:  CLINICAL DATA:  Spinal stenosis. Back pain radiating down both legs.  EXAM: MRI LUMBAR SPINE WITHOUT CONTRAST  TECHNIQUE: Multiplanar, multisequence MR imaging of the lumbar spine was performed. No intravenous contrast was administered.  COMPARISON:  MRI lumbar spine October 24, 2017.  FINDINGS: Segmentation: Standard. Inferior-most fully formed intervertebral disc is labeled L5-S1. This is concordant with prior numbering.  Alignment:  Normal.  Vertebrae: Vertebral body heights are maintained. No specific evidence of acute fracture, discitis/osteomyelitis, or suspicious bone lesion. Similar degenerative endplate signal changes, most conspicuous involving the inferior  L4 vertebral body in the region of a Schmorl's node.  Conus medullaris and cauda equina: Conus extends to the L1 level. Conus and cauda equina appear normal.  Paraspinal and other soft tissues: Unremarkable.  Disc levels:  T12-L1: No significant disc protrusion, foraminal stenosis, or canal stenosis.  L1-L2: Mild disc bulging without significant canal or foraminal stenosis.  L2-L3: Mild disc bulging without significant canal or foraminal stenosis.  L3-L4: Mild disc bulging without significant canal or foraminal stenosis.  L4-L5: Disc desiccation and height loss. Broad disc bulge with superimposed inferiorly dissecting central disc protrusion, which has decreased slightly in size compared to prior. This disc contacts bilateral descending L5 nerve roots. No significant foraminal stenosis.  L5-S1: Disc desiccation and height loss. Broad disc bulge with superimposed left paracentral disc protrusion with annular fissure, which has decreased in size slightly compared to prior. This disc contacts the descending left S1 nerve root. No significant foraminal stenosis.  IMPRESSION: 1. At L4-L5, disc degeneration with slightly decreased size of the central disc protrusion which contacts the descending L5 nerve roots bilaterally. 2. At L5-S1, disc degeneration with slightly decreased size of the left paracentral disc protrusion which contacts the descending left S1 nerve root.   Electronically Signed   By: Feliberto Harts MD   On: 04/10/2020 15:19     Subjective: Chief Complaint  Patient presents with  . Lower Back - Follow-up    MRI Review    30 year old male with history of lumbar DDD, multilevel schmorl's nodes L1-2 through L5-S1. He has been employed continuously and I  Have followed him for the last 7 years with mechanical back pain that radiates into both legs, in the past right greater than left. He  Has required meds in the form of opiods for  years. Motrin, naprosyn have not been of benefit. He is at a point were he is ready to consider Surgical treatment. ESIs in the past the first helped but the second did not. Physical therapy was done and he continues to perform some  Home exercise and does some yoga. Now taking nothing for pain, in the past had received hydrocodone and tylenol #3. He is barely working and feels as though his back pain limits his ability to work and is a cause for pain in his person relationship with his girlfriend.     Review of Systems  Constitutional: Negative.   HENT: Negative.   Eyes: Negative.   Respiratory: Negative.   Cardiovascular: Negative.   Gastrointestinal: Negative.   Endocrine: Negative.   Genitourinary: Negative.   Musculoskeletal: Negative.   Skin: Negative.   Allergic/Immunologic: Negative.   Neurological: Negative.   Hematological: Negative.   Psychiatric/Behavioral: Negative.      Objective: Vital Signs: BP 136/82 (BP Location: Left Arm, Patient Position: Sitting)   Pulse 77   Ht 6' (1.829 m)   Wt 210 lb (95.3 kg)   BMI 28.48 kg/m   Physical Exam Constitutional:  Appearance: He is well-developed.  HENT:     Head: Normocephalic and atraumatic.  Eyes:     Pupils: Pupils are equal, round, and reactive to light.  Pulmonary:     Effort: Pulmonary effort is normal.     Breath sounds: Normal breath sounds.  Abdominal:     General: Bowel sounds are normal.     Palpations: Abdomen is soft.  Musculoskeletal:     Cervical back: Normal range of motion and neck supple.     Lumbar back: Positive left straight leg raise test. Negative right straight leg raise test.  Skin:    General: Skin is warm and dry.  Neurological:     Mental Status: He is alert and oriented to person, place, and time.  Psychiatric:        Behavior: Behavior normal.        Thought Content: Thought content normal.        Judgment: Judgment normal.     Back Exam   Tenderness  The patient is  experiencing tenderness in the lumbar.  Range of Motion  Extension: abnormal  Flexion: abnormal  Lateral bend right: abnormal  Lateral bend left: abnormal  Rotation right: abnormal  Rotation left: abnormal   Muscle Strength  Right Quadriceps:  5/5  Left Quadriceps:  5/5  Right Hamstrings:  5/5  Left Hamstrings:  5/5   Tests  Straight leg raise right: negative Straight leg raise left: positive  Reflexes  Patellar: 0/4 Achilles: 0/4  Other  Toe walk: abnormal Heel walk: abnormal Sensation: normal Gait: abnormal  Erythema: no back redness Scars: absent  Comments:  Numbness great toe and 2 nd toe.       Specialty Comments:  No specialty comments available.  Imaging: No results found.   PMFS History: Patient Active Problem List   Diagnosis Date Noted  . Right knee pain 07/05/2012  . Nicotine addiction 12/01/2011  . GERD 09/17/2008  . OTHER SEBORRHEIC DERMATITIS 09/17/2008  . HYPERHIDROSIS 09/17/2008   No past medical history on file.  Family History  Problem Relation Age of Onset  . Cancer Mother   . Healthy Father     Past Surgical History:  Procedure Laterality Date  . VENTRAL HERNIA REPAIR N/A 08/07/2018   Procedure: HERNIA REPAIR umblical;  Surgeon: Berna Bue, MD;  Location: [facility];  Service: General;  Laterality: N/A;   Social History   Occupational History  . Not on file  Tobacco Use  . Smoking status: Current Every Day Smoker    Packs/day: 0.05    Types: Cigarettes  . Smokeless tobacco: Current User  Vaping Use  . Vaping Use: Never used  Substance and Sexual Activity  . Alcohol use: Yes    Comment: Socially  . Drug use: Yes    Types: Marijuana  . Sexual activity: Yes

## 2020-04-25 NOTE — Addendum Note (Signed)
Addended by: Vira Browns on: 04/25/2020 02:11 PM   Modules accepted: Orders

## 2020-05-06 ENCOUNTER — Encounter: Payer: Self-pay | Admitting: Specialist

## 2020-05-06 NOTE — Telephone Encounter (Signed)
Planning surgery for 05/28/20.

## 2020-05-09 ENCOUNTER — Encounter: Payer: Self-pay | Admitting: Radiology

## 2020-05-22 ENCOUNTER — Ambulatory Visit: Payer: No Typology Code available for payment source | Admitting: Surgery

## 2020-05-23 ENCOUNTER — Ambulatory Visit: Payer: No Typology Code available for payment source | Admitting: Specialist

## 2020-05-27 ENCOUNTER — Other Ambulatory Visit: Payer: Self-pay | Admitting: Radiology

## 2020-05-27 MED ORDER — HYDROCODONE-ACETAMINOPHEN 7.5-325 MG PO TABS
1.0000 | ORAL_TABLET | Freq: Four times a day (QID) | ORAL | 0 refills | Status: DC | PRN
Start: 2020-05-27 — End: 2020-06-27

## 2020-05-28 ENCOUNTER — Inpatient Hospital Stay: Admit: 2020-05-28 | Payer: No Typology Code available for payment source | Admitting: Specialist

## 2020-05-28 SURGERY — POSTERIOR LUMBAR FUSION 2 LEVEL
Anesthesia: General

## 2020-05-31 ENCOUNTER — Ambulatory Visit (INDEPENDENT_AMBULATORY_CARE_PROVIDER_SITE_OTHER): Payer: No Typology Code available for payment source | Admitting: Orthopaedic Surgery

## 2020-05-31 DIAGNOSIS — M5416 Radiculopathy, lumbar region: Secondary | ICD-10-CM

## 2020-05-31 DIAGNOSIS — M5126 Other intervertebral disc displacement, lumbar region: Secondary | ICD-10-CM

## 2020-05-31 NOTE — Progress Notes (Signed)
Office Visit Note   Patient: Joseph Vazquez           Date of Birth: 06-22-1990           MRN: [id number] Visit Date: 05/31/2020              Requested by: Deatra James, MD [phone] [place],  [place] [address] PCP: Deatra James, MD   Assessment & Plan: Visit Diagnoses:  1. Protrusion of lumbar intervertebral disc   2. Radiculopathy, lumbar region     Plan: Patient will continue maximal conservative treatment has lumbar disc degeneration protrusion significant Modic changes from disc degeneration at L4-5 and L5-S1.  Discussion about surgical treatment with fusion due to his Mobic changes lateral recess stenosis and nerve compression.  We discussed smoking cessation to improve his healing rate and he states this is something he can do.  I would agree with outlined plan for two-level lumbar fusion at L4-5 and L5-S1.  Follow-Up Instructions: No follow-ups on file.   Orders:  No orders of the defined types were placed in this encounter.  No orders of the defined types were placed in this encounter.     Procedures: No procedures performed   Clinical Data: No additional findings.   Subjective: Chief Complaint  Patient presents with  . Lower Back - Pain    HPI 30 year old male who been scheduled for two-level lumbar surgery apparently canceled by insurance is here for second opinion.  Patient been followed for over 7 years with disc degeneration with progressive Mobic changes at 2 levels L4-5 and L5-S1.  He has disc protrusion at L4-5 contacting descending L5 nerve roots right and left.  Broad-based disc desiccation and paracentral protrusion at L5-S1 contacting the descending left S1 nerve root.  Patient has been through therapy more than once, multiple epidural injections, activity modification, anti-inflammatories, core strengthening.  He has been on hydrocodone low-dose, oxycodone with 30 tablets usually lasting him 2 months, also some tramadol.  Patient tries to  avoid taking the pain medication except when he gets so bad he has trouble standing up walking.   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  He is looking for work activity that would be easier but does involve his much repetitive bending turning and lifting.  Dr. Otelia Sergeant had discussed with him extensively recommendations and indications for surgery, risks, postoperative plan.  Patient does have family history of disc degeneration.  Patient has no  Bowel or bladder symptoms.  Review of Systems all other systems noncontributory to HPI.   Objective: Vital Signs: BP (!) 142/92   Pulse (!) 102   Ht 6' (1.829 m)   Wt 210 lb (95.3 kg)   BMI 28.48 kg/m   Physical Exam Constitutional:      Appearance: He is well-developed.  HENT:     Head: Normocephalic and atraumatic.  Eyes:     Pupils: Pupils are equal, round, and reactive to light.  Neck:     Thyroid: No thyromegaly.     Trachea: No tracheal deviation.  Cardiovascular:     Rate and Rhythm: Normal rate.  Pulmonary:     Effort: Pulmonary effort is normal.     Breath sounds: No wheezing.  Abdominal:     General: Bowel sounds are normal.     Palpations: Abdomen is soft.  Skin:    General: Skin is warm and dry.  Capillary Refill: Capillary refill takes less than 2 seconds.  Neurological:     Mental Status: He is alert and oriented to person, place, and time.  Psychiatric:        Behavior: Behavior normal.        Thought Content: Thought content normal.        Judgment: Judgment normal.     Ortho Exam patient has negative logroll of the hips.  Has some pain with straight leg raising 80 degrees on the left negative on the right.  Reflexes only trace knee and ankle jerk.  He can heel walk but has pain left leg.  He fatigues out with single stance toe raises on the left normal on the right.  No calf or anterior compartment atrophy right or left leg.  Distal  pulses are 2+.  Knees reach full extension.  Decreased sensation left dorsal foot foot.  Normal on the right.  Specialty Comments:  No specialty comments available.  Imaging: Narrative & Impression  CLINICAL DATA:  Spinal stenosis. Back pain radiating down both legs.  EXAM: MRI LUMBAR SPINE WITHOUT CONTRAST  TECHNIQUE: Multiplanar, multisequence MR imaging of the lumbar spine was performed. No intravenous contrast was administered.  COMPARISON:  MRI lumbar spine October 24, 2017.  FINDINGS: Segmentation: Standard. Inferior-most fully formed intervertebral disc is labeled L5-S1. This is concordant with prior numbering.  Alignment:  Normal.  Vertebrae: Vertebral body heights are maintained. No specific evidence of acute fracture, discitis/osteomyelitis, or suspicious bone lesion. Similar degenerative endplate signal changes, most conspicuous involving the inferior L4 vertebral body in the region of a Schmorl's node.  Conus medullaris and cauda equina: Conus extends to the L1 level. Conus and cauda equina appear normal.  Paraspinal and other soft tissues: Unremarkable.  Disc levels:  T12-L1: No significant disc protrusion, foraminal stenosis, or canal stenosis.  L1-L2: Mild disc bulging without significant canal or foraminal stenosis.  L2-L3: Mild disc bulging without significant canal or foraminal stenosis.  L3-L4: Mild disc bulging without significant canal or foraminal stenosis.  L4-L5: Disc desiccation and height loss. Broad disc bulge with superimposed inferiorly dissecting central disc protrusion, which has decreased slightly in size compared to prior. This disc contacts bilateral descending L5 nerve roots. No significant foraminal stenosis.  L5-S1: Disc desiccation and height loss. Broad disc bulge with superimposed left paracentral disc protrusion with annular fissure, which has decreased in size slightly compared to prior. This  disc contacts the descending left S1 nerve root. No significant foraminal stenosis.  IMPRESSION: 1. At L4-L5, disc degeneration with slightly decreased size of the central disc protrusion which contacts the descending L5 nerve roots bilaterally. 2. At L5-S1, disc degeneration with slightly decreased size of the left paracentral disc protrusion which contacts the descending left S1 nerve root.   Electronically Signed   By: Feliberto Harts MD   On: 04/10/2020 15:19      PMFS History: Patient Active Problem List   Diagnosis Date Noted  . Protrusion of lumbar intervertebral disc 06/03/2020  . Radiculopathy, lumbar region 06/03/2020  . Right knee pain 07/05/2012  . Nicotine addiction 12/01/2011  . GERD 09/17/2008  . OTHER SEBORRHEIC DERMATITIS 09/17/2008  . HYPERHIDROSIS 09/17/2008   No past medical history on file.  Family History  Problem Relation Age of Onset  . Cancer Mother   . Healthy Father     Past Surgical History:  Procedure Laterality Date  . VENTRAL HERNIA REPAIR N/A 08/07/2018   Procedure: HERNIA REPAIR umblical;  Surgeon: Fredricka Bonine,  Lady Deutscher, MD;  Location: [facility];  Service: General;  Laterality: N/A;   Social History   Occupational History  . Not on file  Tobacco Use  . Smoking status: Current Every Day Smoker    Packs/day: 0.05    Types: Cigarettes  . Smokeless tobacco: Current User  Vaping Use  . Vaping Use: Never used  Substance and Sexual Activity  . Alcohol use: Yes    Comment: Socially  . Drug use: Yes    Types: Marijuana  . Sexual activity: Yes

## 2020-06-03 DIAGNOSIS — M5126 Other intervertebral disc displacement, lumbar region: Secondary | ICD-10-CM | POA: Insufficient documentation

## 2020-06-03 DIAGNOSIS — M5416 Radiculopathy, lumbar region: Secondary | ICD-10-CM | POA: Insufficient documentation

## 2020-06-06 ENCOUNTER — Encounter: Payer: Self-pay | Admitting: Specialist

## 2020-06-06 NOTE — Telephone Encounter (Signed)
Hi Sherrie, please resubmit for surgery as planned with second opinion.

## 2020-06-06 NOTE — Telephone Encounter (Signed)
We ask Sherrie to resubmit with the second opinion from Dr. Ophelia Charter.

## 2020-06-27 ENCOUNTER — Other Ambulatory Visit: Payer: Self-pay | Admitting: Radiology

## 2020-06-27 NOTE — Telephone Encounter (Signed)
Requesting medication

## 2020-06-28 MED ORDER — HYDROCODONE-ACETAMINOPHEN 7.5-325 MG PO TABS
1.0000 | ORAL_TABLET | Freq: Four times a day (QID) | ORAL | 0 refills | Status: DC | PRN
Start: 2020-06-28 — End: 2020-07-28

## 2020-07-19 ENCOUNTER — Encounter: Payer: Self-pay | Admitting: Orthopaedic Surgery

## 2020-07-19 NOTE — Telephone Encounter (Signed)
Holding for [facility] to discuss with Dr. Otelia Sergeant. Dr. Ophelia Charter saw him to provide second opinion I believe because insurance denied his surgery request.

## 2020-07-21 ENCOUNTER — Encounter: Payer: Self-pay | Admitting: Specialist

## 2020-07-28 ENCOUNTER — Other Ambulatory Visit: Payer: Self-pay | Admitting: Specialist

## 2020-07-29 MED ORDER — HYDROCODONE-ACETAMINOPHEN 7.5-325 MG PO TABS
1.0000 | ORAL_TABLET | Freq: Four times a day (QID) | ORAL | 0 refills | Status: DC | PRN
Start: 2020-07-29 — End: 2020-08-27

## 2020-08-14 NOTE — Telephone Encounter (Signed)
Please ask Dr. Otelia Sergeant about next step.  Thanks!

## 2020-08-27 ENCOUNTER — Other Ambulatory Visit: Payer: Self-pay | Admitting: Specialist

## 2020-08-28 MED ORDER — HYDROCODONE-ACETAMINOPHEN 7.5-325 MG PO TABS
1.0000 | ORAL_TABLET | Freq: Two times a day (BID) | ORAL | 0 refills | Status: DC | PRN
Start: 2020-08-28 — End: 2020-09-30

## 2020-09-24 ENCOUNTER — Other Ambulatory Visit: Payer: Self-pay | Admitting: Radiology

## 2020-09-24 ENCOUNTER — Encounter: Payer: Self-pay | Admitting: Specialist

## 2020-09-30 ENCOUNTER — Other Ambulatory Visit: Payer: Self-pay

## 2020-09-30 ENCOUNTER — Encounter: Payer: Self-pay | Admitting: Orthopaedic Surgery

## 2020-10-01 ENCOUNTER — Encounter: Payer: Self-pay | Admitting: Radiology

## 2020-10-01 ENCOUNTER — Other Ambulatory Visit: Payer: Self-pay | Admitting: Surgery

## 2020-10-01 MED ORDER — HYDROCODONE-ACETAMINOPHEN 7.5-325 MG PO TABS: 1.0000 | ORAL_TABLET | Freq: Two times a day (BID) | ORAL | 0 refills | Status: DC | PRN

## 2020-10-21 ENCOUNTER — Encounter: Payer: Self-pay | Admitting: Specialist

## 2020-10-21 ENCOUNTER — Other Ambulatory Visit: Payer: Self-pay

## 2020-10-21 ENCOUNTER — Ambulatory Visit (INDEPENDENT_AMBULATORY_CARE_PROVIDER_SITE_OTHER): Payer: No Typology Code available for payment source | Admitting: Specialist

## 2020-10-21 VITALS — BP 128/83 | HR 106 | Ht 72.0 in | Wt 210.0 lb

## 2020-10-21 DIAGNOSIS — M5126 Other intervertebral disc displacement, lumbar region: Secondary | ICD-10-CM

## 2020-10-21 DIAGNOSIS — M4200 Juvenile osteochondrosis of spine, site unspecified: Secondary | ICD-10-CM | POA: Diagnosis not present

## 2020-10-21 DIAGNOSIS — M5416 Radiculopathy, lumbar region: Secondary | ICD-10-CM

## 2020-10-21 DIAGNOSIS — M5136 Other intervertebral disc degeneration, lumbar region: Secondary | ICD-10-CM | POA: Diagnosis not present

## 2020-10-21 MED ORDER — GABAPENTIN 100 MG PO CAPS
100.0000 mg | ORAL_CAPSULE | Freq: Three times a day (TID) | ORAL | 3 refills | Status: DC
Start: 2020-10-21 — End: 2020-11-18

## 2020-10-21 MED ORDER — HYDROCODONE-ACETAMINOPHEN 7.5-325 MG PO TABS
1.0000 | ORAL_TABLET | Freq: Four times a day (QID) | ORAL | 0 refills | Status: DC | PRN
Start: 2020-10-21 — End: 2020-11-08

## 2020-10-21 NOTE — Progress Notes (Signed)
Office Visit Note   Patient: Joseph Vazquez           Date of Birth: 1990/03/08           MRN: [id number] Visit Date: 10/21/2020              Requested by: Deatra Icker Swigert, MD [phone] [place],  [place] [address] PCP: Deatra Aseem Sessums, MD   Assessment & Plan: Visit Diagnoses:  1. Protrusion of lumbar intervertebral disc   2. Radiculopathy, lumbar region   3. Degenerative disc disease, lumbar   4. Scheurmann's disease   5. Lumbar disc herniation     Plan: Plan: Avoid bending, stooping and avoid lifting weights greater than 10 lbs. Avoid prolong standing and walking. Order for a new walker with wheels. Surgery scheduling secretary Tivis Ringer, will call you in the next week to schedule for surgery.  Surgery recommended is a two level lumbar fusion L4-5and L5-S1 this would be done with rods, screws and cages with local bone graft and allograft (donor bone graft). Take hydrocodone for for pain. Risk of surgery includes risk of infection 1 in 200 patients, bleeding 1/4% chance you would need a transfusion.   Risk to the nerves is one in 10,000. You will need to use a brace for 3 months and wean from the brace on the 4th month. Expect improved walking and standing tolerance. Expect relief of leg pain but numbness may persist depending on the length and degree of pressure that has been present.  Follow-Up Instructions: Return in about 4 weeks (around 11/18/2020).   Orders:  No orders of the defined types were placed in this encounter.  No orders of the defined types were placed in this encounter.     Procedures: No procedures performed   Clinical Data: No additional findings.   Subjective: Chief Complaint  Patient presents with   Lower Back - Pain    Here to discuss options for his back since his insurance company is denying his surgery     30 year old male with history of lumbar degenerative disc disease with complaints of pain in the back buttocks and into  the posterior Thighs and calves into the plantar lateral foot bilateral right greater than left. He has been unable to work his job at [employer] and he was  Being followed for years. He now has been out of work for 5 months and is thinking about other work but he is still having pain that is no less than A "5". It is better with lying down, unable to take care of his home and he is unable to play with his 57 year old child. No bowel or bladder difficulty, not  Feeling like the bladder emptys fully. I had him seem by my partner for assessment and there was agreement that a two level TLIF or fusion was indicated For his ongoing severe pain that has him unable to work the last 5 months. He has been performing exercises in a HEP for the last 2-3 months but finds that It is not helping. Taking Hydrocodone intermittantly.   Review of Systems  Constitutional: Negative.   HENT: Negative.    Eyes: Negative.   Respiratory: Negative.    Cardiovascular: Negative.   Gastrointestinal: Negative.   Endocrine: Negative.   Genitourinary: Negative.   Musculoskeletal: Negative.   Skin: Negative.   Allergic/Immunologic: Negative.   Neurological: Negative.   Hematological: Negative.   Psychiatric/Behavioral: Negative.  Objective: Vital Signs: BP 128/83 (BP Location: Left Arm, Patient Position: Sitting)   Pulse (!) 106   Ht 6' (1.829 m)   Wt 210 lb (95.3 kg)   BMI 28.48 kg/m   Physical Exam Constitutional:      Appearance: He is well-developed.  HENT:     Head: Normocephalic and atraumatic.  Eyes:     Pupils: Pupils are equal, round, and reactive to light.  Pulmonary:     Effort: Pulmonary effort is normal.     Breath sounds: Normal breath sounds.  Abdominal:     General: Bowel sounds are normal.     Palpations: Abdomen is soft.  Musculoskeletal:     Cervical back: Normal range of motion and neck supple.     Lumbar back: Positive right straight leg raise test and positive left straight  leg raise test.  Skin:    General: Skin is warm and dry.  Neurological:     Mental Status: He is alert and oriented to person, place, and time.  Psychiatric:        Behavior: Behavior normal.        Thought Content: Thought content normal.        Judgment: Judgment normal.   Back Exam   Tenderness  The patient is experiencing tenderness in the lumbar.  Range of Motion  Extension:  abnormal  Flexion:  abnormal  Lateral bend right:  abnormal  Lateral bend left:  abnormal  Rotation right:  abnormal  Rotation left:  abnormal   Muscle Strength  Right Quadriceps:  5/5  Left Quadriceps:  5/5  Right Hamstrings:  5/5  Left Hamstrings:  5/5   Tests  Straight leg raise right: positive Straight leg raise left: positive  Other  Sensation: normal Gait: normal  Erythema: no back redness Scars: absent  Comments:  Tight hamstrings, motor is intact. Describes grating sensation with movement of the the lumbar spine.     Specialty Comments:  No specialty comments available.  Imaging: No results found.   PMFS History: Patient Active Problem List   Diagnosis Date Noted   Protrusion of lumbar intervertebral disc 06/03/2020   Radiculopathy, lumbar region 06/03/2020   Right knee pain 07/05/2012   Nicotine addiction 12/01/2011   GERD 09/17/2008   OTHER SEBORRHEIC DERMATITIS 09/17/2008   HYPERHIDROSIS 09/17/2008   No past medical history on file.  Family History  Problem Relation Age of Onset   Cancer Mother    Healthy Father     Past Surgical History:  Procedure Laterality Date   VENTRAL HERNIA REPAIR N/A 08/07/2018   Procedure: HERNIA REPAIR umblical;  Surgeon: Berna Bue, MD;  Location: [facility];  Service: General;  Laterality: N/A;   Social History   Occupational History   Not on file  Tobacco Use   Smoking status: Every Day    Packs/day: 0.05    Types: Cigarettes   Smokeless tobacco: Current  Vaping Use   Vaping Use: Never used  Substance and Sexual  Activity   Alcohol use: Yes    Comment: Socially   Drug use: Yes    Types: Marijuana   Sexual activity: Yes

## 2020-10-21 NOTE — Patient Instructions (Addendum)
Plan: Avoid bending, stooping and avoid lifting weights greater than 10 lbs. Avoid prolong standing and walking. Order for a new walker with wheels. Surgery scheduling secretary Sherri Billings, will call you in the next week to schedule for surgery.  Surgery recommended is a two level lumbar fusion L4-5and L5-S1 this would be done with rods, screws and cages with local bone graft and allograft (donor bone graft). Take hydrocodone for for pain. Risk of surgery includes risk of infection 1 in 200 patients, bleeding 1/4% chance you would need a transfusion.   Risk to the nerves is one in 10,000. You will need to use a brace for 3 months and wean from the brace on the 4th month. Expect improved walking and standing tolerance. Expect relief of leg pain but numbness may persist depending on the length and degree of pressure that has been present. 

## 2020-11-08 ENCOUNTER — Other Ambulatory Visit: Payer: Self-pay | Admitting: Specialist

## 2020-11-11 ENCOUNTER — Other Ambulatory Visit: Payer: Self-pay | Admitting: Specialist

## 2020-11-11 MED ORDER — HYDROCODONE-ACETAMINOPHEN 7.5-325 MG PO TABS
1.0000 | ORAL_TABLET | Freq: Four times a day (QID) | ORAL | 0 refills | Status: DC | PRN
Start: 2020-11-11 — End: 2020-11-18

## 2020-11-15 ENCOUNTER — Other Ambulatory Visit: Payer: Self-pay | Admitting: Specialist

## 2020-11-18 ENCOUNTER — Encounter: Payer: Self-pay | Admitting: Specialist

## 2020-11-18 ENCOUNTER — Other Ambulatory Visit: Payer: Self-pay | Admitting: Radiology

## 2020-11-18 ENCOUNTER — Other Ambulatory Visit: Payer: Self-pay | Admitting: Physician Assistant

## 2020-11-18 MED ORDER — GABAPENTIN 100 MG PO CAPS
100.0000 mg | ORAL_CAPSULE | Freq: Three times a day (TID) | ORAL | 3 refills | Status: DC
Start: 2020-11-18 — End: 2020-12-11

## 2020-11-18 MED ORDER — HYDROCODONE-ACETAMINOPHEN 7.5-325 MG PO TABS
1.0000 | ORAL_TABLET | Freq: Four times a day (QID) | ORAL | 0 refills | Status: DC | PRN
Start: 2020-11-18 — End: 2020-12-11

## 2020-12-11 ENCOUNTER — Other Ambulatory Visit: Payer: Self-pay | Admitting: Specialist

## 2020-12-11 ENCOUNTER — Other Ambulatory Visit: Payer: Self-pay | Admitting: Radiology

## 2020-12-11 MED ORDER — GABAPENTIN 100 MG PO CAPS
100.0000 mg | ORAL_CAPSULE | Freq: Three times a day (TID) | ORAL | 3 refills | Status: DC
Start: 2020-12-11 — End: 2021-01-01

## 2020-12-11 MED ORDER — HYDROCODONE-ACETAMINOPHEN 7.5-325 MG PO TABS
1.0000 | ORAL_TABLET | Freq: Four times a day (QID) | ORAL | 0 refills | Status: DC | PRN
Start: 2020-12-11 — End: 2020-12-30

## 2020-12-12 ENCOUNTER — Other Ambulatory Visit: Payer: Self-pay | Admitting: Specialist

## 2020-12-12 NOTE — Telephone Encounter (Signed)
I called and Pharmacy has request

## 2020-12-12 NOTE — Telephone Encounter (Signed)
Pharmacy has request

## 2020-12-27 ENCOUNTER — Encounter: Payer: Self-pay | Admitting: Orthopaedic Surgery

## 2020-12-27 NOTE — Telephone Encounter (Signed)
Can you please advise?

## 2020-12-27 NOTE — Telephone Encounter (Signed)
Please see response from Dr. Ophelia Charter. Could you please have Dr. Otelia Sergeant advise on Tuesday or Wednesday?

## 2020-12-30 ENCOUNTER — Other Ambulatory Visit: Payer: Self-pay | Admitting: Specialist

## 2020-12-30 MED ORDER — HYDROCODONE-ACETAMINOPHEN 7.5-325 MG PO TABS
1.0000 | ORAL_TABLET | Freq: Four times a day (QID) | ORAL | 0 refills | Status: DC | PRN
Start: 2020-12-30 — End: 2021-01-23

## 2021-01-01 ENCOUNTER — Other Ambulatory Visit: Payer: Self-pay | Admitting: Specialist

## 2021-01-01 ENCOUNTER — Encounter: Payer: Self-pay | Admitting: Specialist

## 2021-01-01 MED ORDER — GABAPENTIN 300 MG PO CAPS: ORAL_CAPSULE | ORAL | 3 refills | Status: DC

## 2021-01-06 ENCOUNTER — Encounter: Payer: Self-pay | Admitting: Specialist

## 2021-01-23 ENCOUNTER — Other Ambulatory Visit: Payer: Self-pay

## 2021-01-23 ENCOUNTER — Encounter: Payer: Self-pay | Admitting: Specialist

## 2021-01-23 ENCOUNTER — Ambulatory Visit (INDEPENDENT_AMBULATORY_CARE_PROVIDER_SITE_OTHER): Payer: 59 | Admitting: Specialist

## 2021-01-23 VITALS — BP 111/73 | HR 93 | Ht 72.0 in | Wt 210.0 lb

## 2021-01-23 DIAGNOSIS — M4200 Juvenile osteochondrosis of spine, site unspecified: Secondary | ICD-10-CM

## 2021-01-23 DIAGNOSIS — M48062 Spinal stenosis, lumbar region with neurogenic claudication: Secondary | ICD-10-CM | POA: Diagnosis not present

## 2021-01-23 DIAGNOSIS — M5126 Other intervertebral disc displacement, lumbar region: Secondary | ICD-10-CM

## 2021-01-23 DIAGNOSIS — M5416 Radiculopathy, lumbar region: Secondary | ICD-10-CM

## 2021-01-23 MED ORDER — HYDROCODONE-ACETAMINOPHEN 7.5-325 MG PO TABS
1.0000 | ORAL_TABLET | Freq: Four times a day (QID) | ORAL | 0 refills | Status: DC | PRN
Start: 2021-01-23 — End: 2021-03-06

## 2021-01-23 MED ORDER — PREGABALIN 50 MG PO CAPS: 50.0000 mg | ORAL_CAPSULE | Freq: Three times a day (TID) | ORAL | 0 refills | Status: DC

## 2021-01-23 NOTE — Progress Notes (Signed)
Office Visit Note   Patient: Joseph Vazquez           Date of Birth: 01-17-1991           MRN: [id number] Visit Date: 01/23/2021              Requested by: Deatra Aili Casillas, MD [phone] [place],  [place] [address] PCP: Deatra Mychaela Lennartz, MD   Assessment & Plan: Visit Diagnoses: No diagnosis found.  Plan: Avoid bending, stooping and avoid lifting weights greater than 10 lbs. Avoid prolong standing and walking. Order for a new walker with wheels. Surgery scheduling secretary Tivis Ringer, will call you in the next week to schedule for surgery.  Surgery recommended is a two level lumbar fusion left L5-S1 and L4-5. This would be done with rods, screws and cages with local bone graft and allograft (donor bone graft). Take hydrocodone for for pain. Risk of surgery includes risk of infection 1 in 200 patients, bleeding 1/2% chance you would need a transfusion.   Risk to the nerves is one in 10,000. You will need to use a brace for 3 months and wean from the brace on the 4th month. Expect improved walking and standing tolerance. Expect relief of leg pain but numbness may persist depending on the length and degree of pressure that has been present.    Follow-Up Instructions: No follow-ups on file.   Orders:  No orders of the defined types were placed in this encounter.  No orders of the defined types were placed in this encounter.     Procedures: No procedures performed   Clinical Data: No additional findings.   Subjective: Chief Complaint  Patient presents with   Lower Back - Pain, Follow-up    30 year old male with history of mechanical back pain and sciatica. He has been treated conservatively for nearly 10 years and is at a point where is back pain is requiring continuous pain medication to relieve his pain. His last PT was nearly 5-6 year ago. He is gaining weight, usually he is around 200 lbs. 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 He has pain with bending, stooping and twisting is painful as is lifting heavier weights. Car driving for long periods 30 min or more is uncomfortable. No Bowel or bladder difficulty. He is Ready to consider intervention.    Review of Systems  Constitutional: Negative.   HENT: Negative.    Eyes: Negative.   Respiratory: Negative.    Cardiovascular: Negative.   Gastrointestinal: Negative.   Endocrine: Negative.   Genitourinary: Negative.   Musculoskeletal: Negative.   Skin: Negative.   Allergic/Immunologic: Negative.   Neurological: Negative.   Hematological: Negative.   Psychiatric/Behavioral: Negative.      Objective: Vital Signs: BP 111/73 (BP Location: Left Arm, Patient Position: Sitting, Cuff Size: Large)    Pulse 93    Ht 6' (1.829 m)    Wt 210 lb (95.3 kg)    BMI 28.48 kg/m   Physical Exam Constitutional:      Appearance: He is well-developed.  HENT:     Head: Normocephalic and atraumatic.  Eyes:     Pupils: Pupils are equal, round, and reactive to light.  Pulmonary:     Effort: Pulmonary effort is normal.     Breath sounds: Normal breath sounds.  Abdominal:     General: Bowel sounds are normal.     Palpations: Abdomen is soft.  Musculoskeletal:     Cervical back: Normal range of motion and neck supple.  Skin:    General: Skin is warm and dry.  Neurological:     Mental Status: He is alert and oriented to person, place, and time.  Psychiatric:        Behavior: Behavior normal.        Thought Content: Thought content normal.        Judgment: Judgment normal.    Back Exam   Tenderness  The patient is experiencing tenderness in the lumbar.  Range of Motion  Extension:  abnormal  Flexion:  abnormal  Lateral bend right:  abnormal  Lateral bend left:  abnormal  Rotation right:  abnormal  Rotation left:  abnormal   Muscle Strength  Right Quadriceps:  5/5  Left Quadriceps:  5/5  Right Hamstrings:  5/5  Left Hamstrings:  5/5    Comments:  Left EHL and left foot DF is weak 4/5 Knee extension is normal.      Specialty Comments:  No specialty comments available.  Imaging: No results found.   PMFS History: Patient Active Problem List   Diagnosis Date Noted   Protrusion of lumbar intervertebral disc 06/03/2020   Radiculopathy, lumbar region 06/03/2020   Right knee pain 07/05/2012   Nicotine addiction 12/01/2011   GERD 09/17/2008   OTHER SEBORRHEIC DERMATITIS 09/17/2008   HYPERHIDROSIS 09/17/2008   History reviewed. No pertinent past medical history.  Family History  Problem Relation Age of Onset   Cancer Mother    Healthy Father     Past Surgical History:  Procedure Laterality Date   VENTRAL HERNIA REPAIR N/A 08/07/2018   Procedure: HERNIA REPAIR umblical;  Surgeon: Berna Bue, MD;  Location: [facility];  Service: General;  Laterality: N/A;   Social History   Occupational History   Not on file  Tobacco Use   Smoking status: Every Day    Packs/day: 0.05    Types: Cigarettes   Smokeless tobacco: Current  Vaping Use   Vaping Use: Never used  Substance and Sexual Activity   Alcohol use: Yes    Comment: Socially   Drug use: Yes    Types: Marijuana   Sexual activity: Yes

## 2021-01-23 NOTE — Patient Instructions (Signed)
Plan: Avoid bending, stooping and avoid lifting weights greater than 10 lbs. Avoid prolong standing and walking. Order for a new walker with wheels. Surgery scheduling secretary Tivis Ringer, will call you in the next week to schedule for surgery.  Surgery recommended is a two level lumbar fusion left L5-S1 and L4-5. This would be done with rods, screws and cages with local bone graft and allograft (donor bone graft). Take hydrocodone for for pain. Risk of surgery includes risk of infection 1 in 200 patients, bleeding 1/2% chance you would need a transfusion.   Risk to the nerves is one in 10,000. You will need to use a brace for 3 months and wean from the brace on the 4th month. Expect improved walking and standing tolerance. Expect relief of leg pain but numbness may persist depending on the length and degree of pressure that has been present.

## 2021-03-06 ENCOUNTER — Other Ambulatory Visit: Payer: Self-pay | Admitting: Specialist

## 2021-03-10 ENCOUNTER — Other Ambulatory Visit: Payer: Self-pay | Admitting: Specialist

## 2021-03-10 MED ORDER — HYDROCODONE-ACETAMINOPHEN 7.5-325 MG PO TABS: 1.0000 | ORAL_TABLET | Freq: Four times a day (QID) | ORAL | 0 refills | Status: DC | PRN

## 2021-03-10 MED ORDER — PREGABALIN 50 MG PO CAPS: 50.0000 mg | ORAL_CAPSULE | Freq: Three times a day (TID) | ORAL | 0 refills | Status: DC

## 2021-03-11 ENCOUNTER — Other Ambulatory Visit: Payer: Self-pay | Admitting: Specialist

## 2021-03-12 ENCOUNTER — Encounter: Payer: Self-pay | Admitting: Surgery

## 2021-03-12 ENCOUNTER — Telehealth: Payer: Self-pay | Admitting: Specialist

## 2021-03-12 ENCOUNTER — Other Ambulatory Visit: Payer: Self-pay

## 2021-03-12 ENCOUNTER — Ambulatory Visit (INDEPENDENT_AMBULATORY_CARE_PROVIDER_SITE_OTHER): Payer: 59

## 2021-03-12 ENCOUNTER — Ambulatory Visit (INDEPENDENT_AMBULATORY_CARE_PROVIDER_SITE_OTHER): Payer: 59 | Admitting: Surgery

## 2021-03-12 VITALS — BP 125/79 | HR 69

## 2021-03-12 DIAGNOSIS — M5126 Other intervertebral disc displacement, lumbar region: Secondary | ICD-10-CM | POA: Diagnosis not present

## 2021-03-12 DIAGNOSIS — M48062 Spinal stenosis, lumbar region with neurogenic claudication: Secondary | ICD-10-CM

## 2021-03-12 DIAGNOSIS — M4316 Spondylolisthesis, lumbar region: Secondary | ICD-10-CM | POA: Diagnosis not present

## 2021-03-12 NOTE — Progress Notes (Signed)
Office Visit Note   Patient: Joseph Vazquez           Date of Birth: 1990-06-17           MRN: [id number] Visit Date: 03/12/2021              Requested by: Deatra Jolanta Cabeza, MD [phone] [address] [place],  [place] [address] PCP: Deatra Eldar Robitaille, MD   Assessment & Plan: Visit Diagnoses:  1. Protrusion of lumbar intervertebral disc   2. Spinal stenosis of lumbar region with neurogenic claudication   3. Spondylolisthesis, lumbar region     Plan: Advised patient that again recommended treatment is L4-5, L5-S1 fusion as previously discussed with Dr. Otelia Sergeant.  He does have an unstable L4-5 spondylolisthesis that moves with flexion and extension seen on x-ray today.  I spoke to our surgery scheduler today and she will start working on the process of getting this approved.  His most recent lumbar MRI was in March 2022 and I will go ahead and update the study due to his worsening symptoms and to make sure that there is an anything new on there.  I will have him follow-up with Dr. Otelia Sergeant in 3 weeks to discuss results.  If he gets this scan in the very near future he will call me and let me know and I will see if I can get him in sooner for surgeon to review.  Follow-Up Instructions: Return in about 3 weeks (around 04/02/2021) for WITH DR NITKA TO REVIEW LUMBAR MRI SCAN AND DISCUSS SURGERY.   Orders:  Orders Placed This Encounter  Procedures   XR Lumbar Spine Complete   MR Lumbar Spine w/o contrast   No orders of the defined types were placed in this encounter.     Procedures: No procedures performed   Clinical Data: No additional findings.   Subjective: Chief Complaint  Patient presents with   Lower Back - Follow-up    HPI 31 year old white male returns with complaints of ongoing chronic low back pain and left lower extremity radiculopathy.  He was last seen by Dr. Otelia Sergeant December 2022.  Patient was previously scheduled for L4-5 and L5-S1 fusion April 2022 but his insurance company  denied it.  Dr. Otelia Sergeant again recommended surgery procedure last office visit.  Patient comes in because he continues to have ongoing pain in his low back and worse in to his left leg.  This is significantly have a negative impact on his quality of life.  Patient has a new insurance.  I documented on his lumbar spine x-ray last year that he had 4 mm of L4 retrolisthesis.   Objective: Vital Signs: BP 125/79 (BP Location: Left Arm, Patient Position: Sitting, Cuff Size: Large)    Pulse 69   Physical Exam Constitutional:      Appearance: Normal appearance.  HENT:     Head: Normocephalic.  Eyes:     Extraocular Movements: Extraocular movements intact.  Pulmonary:     Effort: No respiratory distress.  Musculoskeletal:     Comments: Gait is somewhat antalgic.  Patient has lumbar paraspinal tenderness/spasm.  Negative logroll bilateral hips.  Positive left straight leg raise.  Bilateral calves nontender.  No focal motor deficits.  Neurological:     Mental Status: He is alert.  Psychiatric:        Mood and Affect: Mood normal.    Ortho Exam  Specialty Comments:  No specialty comments available.  Imaging: XR Lumbar Spine Complete  Result Date:  03/12/2021 X-ray lumbar spine AP lateral flexion-extension views obtained today.  He has degenerative disc disease L4-5 L5-S1 with facet changes.  He has 4 mm of L4 retrolisthesis and this moves with extension to about 7 to 8 mm.    PMFS History: Patient Active Problem List   Diagnosis Date Noted   Protrusion of lumbar intervertebral disc 06/03/2020   Radiculopathy, lumbar region 06/03/2020   Right knee pain 07/05/2012   Nicotine addiction 12/01/2011   GERD 09/17/2008   OTHER SEBORRHEIC DERMATITIS 09/17/2008   HYPERHIDROSIS 09/17/2008   History reviewed. No pertinent past medical history.  Family History  Problem Relation Age of Onset   Cancer Mother    Healthy Father     Past Surgical History:  Procedure Laterality Date   VENTRAL HERNIA  REPAIR N/A 08/07/2018   Procedure: HERNIA REPAIR umblical;  Surgeon: Berna Bue, MD;  Location: [facility];  Service: General;  Laterality: N/A;   Social History   Occupational History   Not on file  Tobacco Use   Smoking status: Every Day    Packs/day: 0.05    Types: Cigarettes   Smokeless tobacco: Current  Vaping Use   Vaping Use: Never used  Substance and Sexual Activity   Alcohol use: Yes    Comment: Socially   Drug use: Yes    Types: Marijuana   Sexual activity: Yes

## 2021-03-12 NOTE — Telephone Encounter (Signed)
I will re-fax with regular fax machine

## 2021-03-12 NOTE — Telephone Encounter (Signed)
I have tried to send on 2 different faxes and it is not going thru, I have called and Lmom for sandra to call back and let me know if there is an email that I can send this to

## 2021-03-12 NOTE — Telephone Encounter (Signed)
Received call from [place] with [facility] advised fax did not come through. Dois Davenport asked if it can be faxed again. Dois Davenport said only 2 pages came through. The number to contact Dois Davenport is [phone] Ext: [phone]

## 2021-03-13 NOTE — Telephone Encounter (Signed)
I called [facility] and spoke with the rep and she states that they are showing that it is being filled to soon, she will call [facility] and get this cleared up

## 2021-03-20 ENCOUNTER — Other Ambulatory Visit: Payer: Self-pay

## 2021-03-20 ENCOUNTER — Ambulatory Visit
Admission: RE | Admit: 2021-03-20 | Discharge: 2021-03-20 | Disposition: A | Discharge status: Home / Self Care | Payer: Self-pay | Source: Ambulatory Visit | Attending: Surgery | Admitting: Surgery

## 2021-03-20 DIAGNOSIS — M48062 Spinal stenosis, lumbar region with neurogenic claudication: Secondary | ICD-10-CM

## 2021-03-20 DIAGNOSIS — M5126 Other intervertebral disc displacement, lumbar region: Secondary | ICD-10-CM

## 2021-03-25 ENCOUNTER — Telehealth: Payer: Self-pay | Admitting: Specialist

## 2021-03-25 NOTE — Telephone Encounter (Signed)
Spoke with patient concerning scheduling an MRI review with Dr Louanne Skye    Patient said he would have to call us back to schedule his appointment

## 2021-04-02 ENCOUNTER — Other Ambulatory Visit: Payer: Self-pay | Admitting: Specialist

## 2021-04-03 MED ORDER — HYDROCODONE-ACETAMINOPHEN 7.5-325 MG PO TABS: 1.0000 | ORAL_TABLET | Freq: Four times a day (QID) | ORAL | 0 refills | Status: DC | PRN

## 2021-04-03 MED ORDER — PREGABALIN 50 MG PO CAPS: 50.0000 mg | ORAL_CAPSULE | Freq: Three times a day (TID) | ORAL | 0 refills | Status: DC

## 2021-04-16 NOTE — Telephone Encounter (Signed)
Please advise on message below. Thank you!

## 2021-04-24 ENCOUNTER — Other Ambulatory Visit: Payer: Self-pay | Admitting: Specialist

## 2021-04-24 MED ORDER — HYDROCODONE-ACETAMINOPHEN 7.5-325 MG PO TABS: 1.0000 | ORAL_TABLET | Freq: Four times a day (QID) | ORAL | 0 refills | Status: DC | PRN

## 2021-04-30 ENCOUNTER — Other Ambulatory Visit: Payer: Self-pay | Admitting: Specialist

## 2021-05-05 ENCOUNTER — Other Ambulatory Visit: Payer: Self-pay | Admitting: Specialist

## 2021-05-05 ENCOUNTER — Encounter: Payer: Self-pay | Admitting: Specialist

## 2021-05-05 MED ORDER — GABAPENTIN 300 MG PO CAPS: 300.0000 mg | ORAL_CAPSULE | Freq: Three times a day (TID) | ORAL | 3 refills | Status: DC

## 2021-05-06 ENCOUNTER — Encounter: Payer: Self-pay | Admitting: Specialist

## 2021-05-06 NOTE — Telephone Encounter (Signed)
Dr. Louanne Skye called patient, able to walk, changed meds to Gabapentin ?

## 2021-05-07 ENCOUNTER — Encounter: Payer: Self-pay | Admitting: Radiology

## 2021-05-19 ENCOUNTER — Other Ambulatory Visit: Payer: Self-pay | Admitting: Specialist

## 2021-05-20 ENCOUNTER — Other Ambulatory Visit: Payer: Self-pay | Admitting: Specialist

## 2021-05-20 MED ORDER — HYDROCODONE-ACETAMINOPHEN 7.5-325 MG PO TABS: 1.0000 | ORAL_TABLET | Freq: Four times a day (QID) | ORAL | 0 refills | Status: DC | PRN

## 2021-05-28 ENCOUNTER — Encounter: Payer: Self-pay | Admitting: Specialist

## 2021-05-29 ENCOUNTER — Telehealth: Payer: Self-pay | Admitting: Specialist

## 2021-05-29 NOTE — Telephone Encounter (Signed)
Please send Hydrocod--to [facility] on [address]  ?

## 2021-05-30 ENCOUNTER — Other Ambulatory Visit: Payer: Self-pay | Admitting: Surgery

## 2021-05-30 MED ORDER — HYDROCODONE-ACETAMINOPHEN 7.5-325 MG PO TABS
1.0000 | ORAL_TABLET | Freq: Two times a day (BID) | ORAL | 0 refills | Status: DC | PRN
Start: 2021-05-30 — End: 2021-06-10

## 2021-05-30 NOTE — Telephone Encounter (Signed)
Patients [facility] does not have [facility] pain meds in stock, he is asking if it can be sent to [facility] and advise ?

## 2021-06-04 ENCOUNTER — Other Ambulatory Visit: Payer: Self-pay | Admitting: Specialist

## 2021-06-10 ENCOUNTER — Other Ambulatory Visit: Payer: Self-pay | Admitting: Radiology

## 2021-06-10 ENCOUNTER — Ambulatory Visit
Admission: EM | Admit: 2021-06-10 | Discharge: 2021-06-10 | Disposition: A | Discharge status: Home / Self Care | Payer: PRIVATE HEALTH INSURANCE | Attending: Internal Medicine | Admitting: Internal Medicine

## 2021-06-10 ENCOUNTER — Ambulatory Visit (HOSPITAL_COMMUNITY)
Admission: RE | Admit: 2021-06-10 | Discharge: 2021-06-10 | Disposition: A | Discharge status: Home / Self Care | Payer: PRIVATE HEALTH INSURANCE | Source: Ambulatory Visit | Attending: Internal Medicine | Admitting: Internal Medicine

## 2021-06-10 DIAGNOSIS — W228XXA Striking against or struck by other objects, initial encounter: Secondary | ICD-10-CM | POA: Insufficient documentation

## 2021-06-10 DIAGNOSIS — M7989 Other specified soft tissue disorders: Secondary | ICD-10-CM | POA: Diagnosis not present

## 2021-06-10 DIAGNOSIS — M79641 Pain in right hand: Secondary | ICD-10-CM | POA: Diagnosis present

## 2021-06-10 DIAGNOSIS — S60921A Unspecified superficial injury of right hand, initial encounter: Secondary | ICD-10-CM | POA: Insufficient documentation

## 2021-06-10 NOTE — ED Triage Notes (Signed)
C/o right hand pain, he reports a metal object fall on top of his hand today. ?

## 2021-06-10 NOTE — ED Provider Notes (Signed)
?[facility] ? ? ? ?CSN: [id number] ?Arrival date & time: 06/10/21  1746 ? ? ?  ? ?History   ?Chief Complaint ?No chief complaint on file. ? ? ?HPI ?Joseph Vazquez is a 31 y.o. male.  ? ?Patient presents with right hand pain after an injury that occurred today.  Patient reports that he was turning a wrench on a metal excavator when his hand slipped off the wrench and hit the metal piece on the excavator.  Having pain in the dorsal surface of the lateral portion of the hand.  Denies any numbness or tingling.  Has full range of motion of hand. ? ? ? ?History reviewed. No pertinent past medical history. ? ?Patient Active Problem List  ? Diagnosis Date Noted  ? Protrusion of lumbar intervertebral disc 06/03/2020  ? Radiculopathy, lumbar region 06/03/2020  ? Right knee pain 07/05/2012  ? Nicotine addiction 12/01/2011  ? GERD 09/17/2008  ? OTHER SEBORRHEIC DERMATITIS 09/17/2008  ? HYPERHIDROSIS 09/17/2008  ? ? ?Past Surgical History:  ?Procedure Laterality Date  ? VENTRAL HERNIA REPAIR N/A 08/07/2018  ? Procedure: HERNIA REPAIR umblical;  Surgeon: Clovis Riley, MD;  Location: [facility];  Service: General;  Laterality: N/A;  ? ? ? ? ? ?Home Medications   ? ?Prior to Admission medications   ?Medication Sig Start Date End Date Taking? Authorizing Provider  ?gabapentin (NEURONTIN) 300 MG capsule Day 1-4 Gabapentin 100mg  AM and Noon and Gabapentin 300 mg at night Day 5-8 Gabapentin 100mg  AM and Gabapentin 300 mg at noon and at night Day 9 onwards Gabapentin 300 mg TID Decrease dose if sedation 01/01/21   Jessy Oto, MD  ?gabapentin (NEURONTIN) 300 MG capsule Take 1 capsule (300 mg total) by mouth 3 (three) times daily. 05/05/21   Jessy Oto, MD  ?HYDROcodone-acetaminophen (NORCO) 7.5-325 MG tablet Take 1 tablet by mouth every 6 (six) hours as needed for moderate pain. 05/20/21   Jessy Oto, MD  ?HYDROcodone-acetaminophen (NORCO) 7.5-325 MG tablet Take 1 tablet by mouth every 12 (twelve) hours as needed for  moderate pain. 05/30/21   Lanae Crumbly, PA-C  ? ? ?Family History ?Family History  ?Problem Relation Age of Onset  ? Cancer Mother   ? Healthy Father   ? ? ?Social History ?Social History  ? ?Tobacco Use  ? Smoking status: Every Day  ?  Packs/day: 0.05  ?  Types: Cigarettes  ? Smokeless tobacco: Current  ?Vaping Use  ? Vaping Use: Never used  ?Substance Use Topics  ? Alcohol use: Yes  ?  Comment: Socially  ? Drug use: Yes  ?  Types: Marijuana  ? ? ? ?Allergies   ?Patient has no known allergies. ? ? ?Review of Systems ?Review of Systems ?Per HPI ? ?Physical Exam ?Triage Vital Signs ?ED Triage Vitals  ?Enc Vitals Group  ?   BP 06/10/21 1755 (!) 144/92  ?   Pulse Rate 06/10/21 1755 84  ?   Resp 06/10/21 1755 18  ?   Temp 06/10/21 1755 98 ?F (36.7 ?C)  ?   Temp Source 06/10/21 1755 Oral  ?   SpO2 06/10/21 1755 96 %  ?   Weight --   ?   Height --   ?   Head Circumference --   ?   Peak Flow --   ?   Pain Score 06/10/21 1756 5  ?   Pain Loc --   ?   Pain Edu? --   ?  Excl. in [place]? --   ? ?No data found. ? ?Updated Vital Signs ?BP (!) 144/92 (BP Location: Left Arm)   Pulse 84   Temp 98 ?F (36.7 ?C) (Oral)   Resp 18   SpO2 96%  ? ?Visual Acuity ?Right Eye Distance:   ?Left Eye Distance:   ?Bilateral Distance:   ? ?Right Eye Near:   ?Left Eye Near:    ?Bilateral Near:    ? ?Physical Exam ?Constitutional:   ?   General: He is not in acute distress. ?   Appearance: Normal appearance. He is not toxic-appearing or diaphoretic.  ?HENT:  ?   Head: Normocephalic and atraumatic.  ?Eyes:  ?   Extraocular Movements: Extraocular movements intact.  ?   Conjunctiva/sclera: Conjunctivae normal.  ?Pulmonary:  ?   Effort: Pulmonary effort is normal.  ?Musculoskeletal:  ?   Comments: Tenderness to palpation overlying dorsal surface of hand at fourth and fifth metatarsal.  Associated mild swelling noted.  No abrasions, lacerations, discoloration noted.  No bleeding noted.  Patient has full range of motion of hand.  Grip strength 5/5.   Neurovascular intact.  ?Neurological:  ?   General: No focal deficit present.  ?   Mental Status: He is alert and oriented to person, place, and time. Mental status is at baseline.  ?Psychiatric:     ?   Mood and Affect: Mood normal.     ?   Behavior: Behavior normal.     ?   Thought Content: Thought content normal.     ?   Judgment: Judgment normal.  ? ? ? ?UC Treatments / Results  ?Labs ?(all labs ordered are listed, but only abnormal results are displayed) ?Labs Reviewed - No data to display ? ?EKG ? ? ?Radiology ?No results found. ? ?Procedures ?Procedures (including critical care time) ? ?Medications Ordered in UC ?Medications - No data to display ? ?Initial Impression / Assessment and Plan / UC Course  ?I have reviewed the triage vital signs and the nursing notes. ? ?Pertinent labs & imaging results that were available during my care of the patient were reviewed by me and considered in my medical decision making (see chart for details). ? ?  ? ?I do think the patient needs x-ray of right hand.  Do not have x-ray tech in urgent care today.  Outpatient imaging ordered at [facility].  Awaiting result.  Patient advised nonweightbearing and no lifting until x-ray result is completed.  Discussed supportive care, ice application and over-the-counter pain relievers.  Patient will need to follow-up if fracture is present to have splint placed.  Patient will also need to follow-up with orthopedist if fracture is present.  Discussed all this with patient.  Patient verbalized understanding and was agreeable with plan. ?Final Clinical Impressions(s) / UC Diagnoses  ? ?Final diagnoses:  ?Right hand pain  ? ? ? ?Discharge Instructions   ? ?  ?An x-ray of your hand has been ordered to be performed at [facility].  Please enter through the emergency department and ask for the imaging center.  Do not sign in as a patient.  You will be referred to the imaging center, have x-ray completed, and leave.  We will  call with results.  Please do not bear any weight or lift objects until we know if fracture is present.  We will call with results.  If fracture is present, you will need to return to urgent care for splint to be  placed. ? ? ? ?ED Prescriptions   ?None ?  ? ?PDMP not reviewed this encounter. ?  ?Teodora Medici, [place] ?06/10/21 1835 ? ?

## 2021-06-10 NOTE — Discharge Instructions (Signed)
An x-ray of your hand has been ordered to be performed at [facility].  Please enter through the emergency department and ask for the imaging center.  Do not sign in as a patient.  You will be referred to the imaging center, have x-ray completed, and leave.  We will call with results.  Please do not bear any weight or lift objects until we know if fracture is present.  We will call with results.  If fracture is present, you will need to return to urgent care for splint to be placed. ?

## 2021-06-11 ENCOUNTER — Other Ambulatory Visit: Payer: Self-pay | Admitting: Specialist

## 2021-06-11 MED ORDER — HYDROCODONE-ACETAMINOPHEN 7.5-325 MG PO TABS: 1.0000 | ORAL_TABLET | Freq: Two times a day (BID) | ORAL | 0 refills | Status: DC | PRN

## 2021-06-11 NOTE — Telephone Encounter (Signed)
Looks like nitka patient

## 2021-06-18 ENCOUNTER — Other Ambulatory Visit: Payer: Self-pay | Admitting: Specialist

## 2021-07-03 ENCOUNTER — Other Ambulatory Visit: Payer: Self-pay | Admitting: Specialist

## 2021-07-04 ENCOUNTER — Encounter: Payer: Self-pay | Admitting: Emergency Medicine

## 2021-07-04 ENCOUNTER — Ambulatory Visit
Admission: EM | Admit: 2021-07-04 | Discharge: 2021-07-04 | Disposition: A | Discharge status: Home / Self Care | Payer: PRIVATE HEALTH INSURANCE

## 2021-07-04 DIAGNOSIS — S61411A Laceration without foreign body of right hand, initial encounter: Secondary | ICD-10-CM

## 2021-07-04 HISTORY — DX: Dorsalgia, unspecified: M54.9

## 2021-07-04 NOTE — ED Triage Notes (Signed)
Patient c/o laceration that occurred around 1415 today.   Patient states " I was using a lawn mower blade when it slipped and cut my RT hand".   Patient has laceration present to RT palmar surface of hand.   Patient hasn't used any medications for injury.   Patient states he has had his Tdap immunization within the last 5 years through work.

## 2021-07-08 ENCOUNTER — Ambulatory Visit
Admission: EM | Admit: 2021-07-08 | Discharge: 2021-07-08 | Disposition: A | Discharge status: Home / Self Care | Payer: PRIVATE HEALTH INSURANCE | Attending: Internal Medicine | Admitting: Internal Medicine

## 2021-07-08 ENCOUNTER — Encounter: Payer: Self-pay | Admitting: Emergency Medicine

## 2021-07-08 ENCOUNTER — Other Ambulatory Visit: Payer: Self-pay

## 2021-07-08 ENCOUNTER — Other Ambulatory Visit: Payer: Self-pay | Admitting: Physician Assistant

## 2021-07-08 DIAGNOSIS — S61411A Laceration without foreign body of right hand, initial encounter: Secondary | ICD-10-CM | POA: Diagnosis not present

## 2021-07-08 DIAGNOSIS — Z23 Encounter for immunization: Secondary | ICD-10-CM | POA: Diagnosis not present

## 2021-07-08 DIAGNOSIS — T148XXA Other injury of unspecified body region, initial encounter: Secondary | ICD-10-CM | POA: Diagnosis not present

## 2021-07-08 MED ORDER — CYCLOBENZAPRINE HCL 5 MG PO TABS: 5.0000 mg | ORAL_TABLET | Freq: Two times a day (BID) | ORAL | 0 refills | Status: DC | PRN

## 2021-07-08 MED ORDER — TETANUS-DIPHTH-ACELL PERTUSSIS 5-2.5-18.5 LF-MCG/0.5 IM SUSY
0.5000 mL | PREFILLED_SYRINGE | Freq: Once | INTRAMUSCULAR | Status: AC
  Administered 2021-07-08: 0.5 mL via INTRAMUSCULAR

## 2021-07-08 NOTE — ED Triage Notes (Signed)
Pt here for wound check to laceration on right hand that was glued; pt sts has reopened; pt also sts left shoulder pain upon waking 3 days ago; denies obvious injury

## 2021-07-08 NOTE — Discharge Instructions (Addendum)
A dressing has been applied to your laceration.  Please keep clean and dry and covered until healed over.  Monitor for signs of infection that include increased redness, swelling, pus.  It appears that you have a muscle strain causing your left upper back/shoulder pain.  A muscle relaxer has been prescribed to help treat this.  Please be advised that it can cause drowsiness.  Do not take any other sedating medications with this.

## 2021-07-08 NOTE — ED Provider Notes (Signed)
[name]    CSN: [id number] Arrival date & time: 07/08/21  1200      History   Chief Complaint Chief Complaint  Patient presents with   Wound Check   Shoulder Pain    HPI Joseph Vazquez is a 31 y.o. male.   Patient presents for further evaluation of laceration to right palm that occurred on 5/26.  Patient was seen at urgent care the day the injury occurred and had glue applied to the laceration.  Patient reports that it has reopened and he is concerned for this.  Denies any associated increased redness, swelling, pus.  Denies fever, body aches, chills.  Patient also reporting left shoulder/upper back pain that extends from left lateral neck that has been present for approximately 3 days.  He reports that he woke up with the pain.  Denies any apparent injury.  He does report that he does a lot of heavy lifting at work so this could be attributing to this.  He has not taken any medication for pain.  Patient also requesting to have tetanus vaccine completed today as he did not have it at previous urgent care visit.  He is not sure when last tetanus vaccine was completed.   Wound Check  Shoulder Pain  Past Medical History:  Diagnosis Date   Back pain     Patient Active Problem List   Diagnosis Date Noted   Protrusion of lumbar intervertebral disc 06/03/2020   Radiculopathy, lumbar region 06/03/2020   Right knee pain 07/05/2012   Nicotine addiction 12/01/2011   GERD 09/17/2008   OTHER SEBORRHEIC DERMATITIS 09/17/2008   HYPERHIDROSIS 09/17/2008    Past Surgical History:  Procedure Laterality Date   VENTRAL HERNIA REPAIR N/A 08/07/2018   Procedure: HERNIA REPAIR umblical;  Surgeon: Berna Bueonnor, Chelsea A, MD;  Location: [facility];  Service: General;  Laterality: N/A;       Home Medications    Prior to Admission medications   Medication Sig Start Date End Date Taking? Authorizing Provider  cyclobenzaprine (FLEXERIL) 5 MG tablet Take 1 tablet (5 mg total) by mouth 2  (two) times daily as needed for muscle spasms. 07/08/21  Yes Sundiata Ferrick, Acie FredricksonHaley E, FNP  gabapentin (NEURONTIN) 300 MG capsule Day 1-4 Gabapentin 100mg  AM and Noon and Gabapentin 300 mg at night Day 5-8 Gabapentin 100mg  AM and Gabapentin 300 mg at noon and at night Day 9 onwards Gabapentin 300 mg TID Decrease dose if sedation 01/01/21   Kerrin ChampagneNitka, James E, MD  gabapentin (NEURONTIN) 300 MG capsule Take 1 capsule (300 mg total) by mouth 3 (three) times daily. 05/05/21   Kerrin ChampagneNitka, James E, MD  HYDROcodone-acetaminophen (NORCO) 7.5-325 MG tablet Take 1 tablet by mouth every 6 (six) hours as needed for moderate pain. 05/20/21   Kerrin ChampagneNitka, James E, MD  HYDROcodone-acetaminophen (NORCO) 7.5-325 MG tablet Take 1 tablet by mouth every 12 (twelve) hours as needed for moderate pain. 06/11/21   Kerrin ChampagneNitka, James E, MD    Family History Family History  Problem Relation Age of Onset   Cancer Mother    Healthy Father     Social History Social History   Tobacco Use   Smoking status: Every Day    Packs/day: 0.05    Types: Cigarettes   Smokeless tobacco: Current  Vaping Use   Vaping Use: Never used  Substance Use Topics   Alcohol use: Yes    Comment: Socially   Drug use: Yes    Types: Marijuana     Allergies  Patient has no known allergies.   Review of Systems Review of Systems Per HPI  Physical Exam Triage Vital Signs ED Triage Vitals  Enc Vitals Group     BP 07/08/21 1312 132/85     Pulse Rate 07/08/21 1312 67     Resp 07/08/21 1312 20     Temp 07/08/21 1312 98.1 F (36.7 C)     Temp src --      SpO2 07/08/21 1312 97 %     Weight --      Height --      Head Circumference --      Peak Flow --      Pain Score 07/08/21 1316 5     Pain Loc --      Pain Edu? --      Excl. in [place]? --    No data found.  Updated Vital Signs BP 132/85 (BP Location: Left Arm)   Pulse 67   Temp 98.1 F (36.7 C)   Resp 20   SpO2 97%   Visual Acuity Right Eye Distance:   Left Eye Distance:   Bilateral Distance:     Right Eye Near:   Left Eye Near:    Bilateral Near:     Physical Exam Constitutional:      General: He is not in acute distress.    Appearance: Normal appearance. He is not toxic-appearing or diaphoretic.  HENT:     Head: Normocephalic and atraumatic.  Eyes:     Extraocular Movements: Extraocular movements intact.     Conjunctiva/sclera: Conjunctivae normal.  Pulmonary:     Effort: Pulmonary effort is normal.  Musculoskeletal:     Comments: Tenderness to palpation to left lateral neck.  No direct spinal tenderness, crepitus, step-off noted.  Tenderness to palpation to left trapezius muscle.  No obvious general tenderness to palpation to left shoulder.  Patient has range of motion of left shoulder but reports that pain occurs with abduction at about 90 degrees.  Grip strength is 5/5.  Neurovascular intact.  Skin:    Comments: Very small approximately 0.5 cm curvilinear laceration/skin avulsion present to palmar surface of right hand directly below first digit.  No bleeding noted.  No purulent drainage or increased redness or swelling noted.  Patient has full range of motion of hand.  Neurovascular intact.  Neurological:     General: No focal deficit present.     Mental Status: He is alert and oriented to person, place, and time. Mental status is at baseline.  Psychiatric:        Mood and Affect: Mood normal.        Behavior: Behavior normal.        Thought Content: Thought content normal.        Judgment: Judgment normal.     UC Treatments / Results  Labs (all labs ordered are listed, but only abnormal results are displayed) Labs Reviewed - No data to display  EKG   Radiology No results found.  Procedures Procedures (including critical care time)  Medications Ordered in UC Medications  Tdap (BOOSTRIX) injection 0.5 mL (0.5 mLs Intramuscular Given 07/08/21 1334)    Initial Impression / Assessment and Plan / UC Course  I have reviewed the triage vital signs and the  nursing notes.  Pertinent labs & imaging results that were available during my care of the patient were reviewed by me and considered in my medical decision making (see chart for details).     Tetanus  vaccine up-to-date per patient request.  It does not appear that tetanus vaccine was administered at previous urgent care visit as patient declined it.  There are no signs of infection to laceration.  It has reopened and the patient was advised that it is not recommended for any further closure at this time as it may delay healing process and there is concern for infection for this.  Nonadherent dressing was applied in urgent care and patient was advised to keep it covered until healed over and to monitor for signs of infection.  Sutures are not needed.  Shoulder/back pain appears to be a muscle strain.  Suspect left trapezius muscle strain.  Will prescribe muscle relaxer to help alleviate discomfort.  Patient was advised to not take any other sedating medications with muscle relaxer.  He has been prescribed Norco and gabapentin by orthopedist about a month ago given chronic back pain but was advised of the risk of taking with other sedating medication and patient voiced understanding.  Discussed supportive care, ice and heat application, pain relievers.  Patient was advised to follow-up with established orthopedist if pain persists or worsens.  Patient verbalized understanding and was agreeable with plan. Final Clinical Impressions(s) / UC Diagnoses   Final diagnoses:  Laceration of right palm, initial encounter  Muscle strain     Discharge Instructions      A dressing has been applied to your laceration.  Please keep clean and dry and covered until healed over.  Monitor for signs of infection that include increased redness, swelling, pus.  It appears that you have a muscle strain causing your left upper back/shoulder pain.  A muscle relaxer has been prescribed to help treat this.  Please be  advised that it can cause drowsiness.  Do not take any other sedating medications with this.    ED Prescriptions     Medication Sig Dispense Auth. Provider   cyclobenzaprine (FLEXERIL) 5 MG tablet Take 1 tablet (5 mg total) by mouth 2 (two) times daily as needed for muscle spasms. 20 tablet [place], [place], [place]      I have reviewed the PDMP during this encounter.   Gustavus Bryant, [place] 07/08/21 [phone]

## 2021-07-11 ENCOUNTER — Encounter: Payer: Self-pay | Admitting: Radiology

## 2021-07-11 NOTE — ED Provider Notes (Signed)
[facility]    CSN: [id number] Arrival date & time: 07/04/21  1441      History   Chief Complaint Chief Complaint  Patient presents with   Laceration    HPI Joseph Vazquez is a 31 y.o. male.   Pt reports he cut his finger ona lawnmower blade.  Pt reports his tetanus is up to date.   No language interpreter was used.  Laceration  Past Medical History:  Diagnosis Date   Back pain     Patient Active Problem List   Diagnosis Date Noted   Protrusion of lumbar intervertebral disc 06/03/2020   Radiculopathy, lumbar region 06/03/2020   Right knee pain 07/05/2012   Nicotine addiction 12/01/2011   GERD 09/17/2008   OTHER SEBORRHEIC DERMATITIS 09/17/2008   HYPERHIDROSIS 09/17/2008    Past Surgical History:  Procedure Laterality Date   VENTRAL HERNIA REPAIR N/A 08/07/2018   Procedure: HERNIA REPAIR umblical;  Surgeon: Berna Bue, MD;  Location: [facility];  Service: General;  Laterality: N/A;       Home Medications    Prior to Admission medications   Medication Sig Start Date End Date Taking? Authorizing Provider  gabapentin (NEURONTIN) 300 MG capsule Take 1 capsule (300 mg total) by mouth 3 (three) times daily. 05/05/21  Yes Kerrin Champagne, MD  cyclobenzaprine (FLEXERIL) 5 MG tablet Take 1 tablet (5 mg total) by mouth 2 (two) times daily as needed for muscle spasms. 07/08/21   Gustavus Bryant, FNP  gabapentin (NEURONTIN) 300 MG capsule Day 1-4 Gabapentin 100mg  AM and Noon and Gabapentin 300 mg at night Day 5-8 Gabapentin 100mg  AM and Gabapentin 300 mg at noon and at night Day 9 onwards Gabapentin 300 mg TID Decrease dose if sedation 01/01/21   , MD  HYDROcodone-acetaminophen (NORCO) 7.5-325 MG tablet Take 1 tablet by mouth every 6 (six) hours as needed for moderate pain. 05/20/21   06-09-1977, MD  HYDROcodone-acetaminophen (NORCO) 7.5-325 MG tablet Take 1 tablet by mouth every 12 (twelve) hours as needed for moderate pain. 06/11/21   06-09-1977,  MD    Family History Family History  Problem Relation Age of Onset   Cancer Mother    Healthy Father     Social History Social History   Tobacco Use   Smoking status: Every Day    Packs/day: 0.05    Types: Cigarettes   Smokeless tobacco: Current  Vaping Use   Vaping Use: Never used  Substance Use Topics   Alcohol use: Yes    Comment: Socially   Drug use: Yes    Types: Marijuana     Allergies   Patient has no known allergies.   Review of Systems Review of Systems  Skin:  Positive for wound.  All other systems reviewed and are negative.   Physical Exam Triage Vital Signs ED Triage Vitals  Enc Vitals Group     BP 07/04/21 1514 134/81     Pulse Rate 07/04/21 1514 77     Resp 07/04/21 1514 16     Temp 07/04/21 1514 98.5 F (36.9 C)     Temp Source 07/04/21 1514 Oral     SpO2 07/04/21 1514 96 %     Weight --      Height --      Head Circumference --      Peak Flow --      Pain Score 07/04/21 1517 4     Pain Loc --  Pain Edu? --      Excl. in GC? --    No data found.  Updated Vital Signs BP 134/81 (BP Location: Left Arm)   Pulse 77   Temp 98.5 F (36.9 C) (Oral)   Resp 16   SpO2 96%   Visual Acuity Right Eye Distance:   Left Eye Distance:   Bilateral Distance:    Right Eye Near:   Left Eye Near:    Bilateral Near:     Physical Exam Vitals reviewed.  Constitutional:      Appearance: Normal appearance.  Musculoskeletal:        General: No swelling or tenderness. Normal range of motion.     Comments: 11mm laceration left hand,  minimal gapping, nv and ns intact   Skin:    General: Skin is warm.  Neurological:     General: No focal deficit present.     Mental Status: He is alert.  Psychiatric:        Mood and Affect: Mood normal.     UC Treatments / Results  Labs (all labs ordered are listed, but only abnormal results are displayed) Labs Reviewed - No data to display  EKG   Radiology No results  found.  Procedures Procedures (including critical care time)  Medications Ordered in UC Medications - No data to display  Initial Impression / Assessment and Plan / UC Course  I have reviewed the triage vital signs and the nursing notes.  Pertinent labs & imaging results that were available during my care of the patient were reviewed by me and considered in my medical decision making (see chart for details).     MDM:  wound is small approx 3-4 mm flap, no deep injury.  I will dermabond,  Final Clinical Impressions(s) / UC Diagnoses   Final diagnoses:  Laceration of right hand without foreign body, initial encounter   Discharge Instructions   None    ED Prescriptions   None    PDMP not reviewed this encounter.   Elson Areas, [place] 07/11/21 1206

## 2021-07-14 ENCOUNTER — Other Ambulatory Visit: Payer: Self-pay | Admitting: Radiology

## 2021-07-15 MED ORDER — GABAPENTIN 300 MG PO CAPS: 300.0000 mg | ORAL_CAPSULE | Freq: Three times a day (TID) | ORAL | 3 refills | Status: DC

## 2021-08-01 ENCOUNTER — Other Ambulatory Visit: Payer: Self-pay | Admitting: Physician Assistant

## 2021-08-10 ENCOUNTER — Other Ambulatory Visit: Payer: Self-pay | Admitting: Specialist

## 2021-08-11 MED ORDER — HYDROCODONE-ACETAMINOPHEN 7.5-325 MG PO TABS: 1.0000 | ORAL_TABLET | Freq: Four times a day (QID) | ORAL | 0 refills | Status: DC | PRN

## 2021-08-17 ENCOUNTER — Ambulatory Visit
Admission: EM | Admit: 2021-08-17 | Discharge: 2021-08-17 | Disposition: A | Discharge status: Other Acute Inpatient Hospital | Payer: PRIVATE HEALTH INSURANCE

## 2021-08-17 ENCOUNTER — Emergency Department (HOSPITAL_COMMUNITY): Payer: PRIVATE HEALTH INSURANCE

## 2021-08-17 ENCOUNTER — Encounter (HOSPITAL_COMMUNITY): Payer: Self-pay

## 2021-08-17 ENCOUNTER — Encounter: Payer: Self-pay | Admitting: Emergency Medicine

## 2021-08-17 ENCOUNTER — Emergency Department (HOSPITAL_COMMUNITY)
Admission: EM | Admit: 2021-08-17 | Discharge: 2021-08-17 | Disposition: A | Discharge status: Home / Self Care | Payer: PRIVATE HEALTH INSURANCE | Attending: Student | Admitting: Student

## 2021-08-17 ENCOUNTER — Other Ambulatory Visit: Payer: Self-pay

## 2021-08-17 DIAGNOSIS — D72829 Elevated white blood cell count, unspecified: Secondary | ICD-10-CM | POA: Insufficient documentation

## 2021-08-17 DIAGNOSIS — F17210 Nicotine dependence, cigarettes, uncomplicated: Secondary | ICD-10-CM | POA: Insufficient documentation

## 2021-08-17 DIAGNOSIS — N50811 Right testicular pain: Secondary | ICD-10-CM | POA: Diagnosis not present

## 2021-08-17 DIAGNOSIS — N3000 Acute cystitis without hematuria: Secondary | ICD-10-CM

## 2021-08-17 DIAGNOSIS — N433 Hydrocele, unspecified: Secondary | ICD-10-CM | POA: Insufficient documentation

## 2021-08-17 DIAGNOSIS — R8289 Other abnormal findings on cytological and histological examination of urine: Secondary | ICD-10-CM | POA: Insufficient documentation

## 2021-08-17 LAB — CBC WITH DIFFERENTIAL/PLATELET
Abs Immature Granulocytes: 0.04 10*3/uL (ref 0.00–0.07)
Basophils Absolute: 0 10*3/uL (ref 0.0–0.1)
Basophils Relative: 0 %
Eosinophils Absolute: 0.1 10*3/uL (ref 0.0–0.5)
Eosinophils Relative: 1 %
HCT: 42.3 % (ref 39.0–52.0)
Hemoglobin: 14.8 g/dL (ref 13.0–17.0)
Immature Granulocytes: 0 %
Lymphocytes Relative: 16 %
Lymphs Abs: 2.1 10*3/uL (ref 0.7–4.0)
MCH: 32.2 pg (ref 26.0–34.0)
MCHC: 35 g/dL (ref 30.0–36.0)
MCV: 92.2 fL (ref 80.0–100.0)
Monocytes Absolute: 0.8 10*3/uL (ref 0.1–1.0)
Monocytes Relative: 6 %
Neutro Abs: 10.2 10*3/uL — ABNORMAL HIGH (ref 1.7–7.7)
Neutrophils Relative %: 77 %
Platelets: 344 10*3/uL (ref 150–400)
RBC: 4.59 MIL/uL (ref 4.22–5.81)
RDW: 12.6 % (ref 11.5–15.5)
WBC: 13.3 10*3/uL — ABNORMAL HIGH (ref 4.0–10.5)
nRBC: 0 % (ref 0.0–0.2)

## 2021-08-17 LAB — URINALYSIS, ROUTINE W REFLEX MICROSCOPIC
Bacteria, UA: NONE SEEN
Bilirubin Urine: NEGATIVE
Glucose, UA: NEGATIVE mg/dL
Hgb urine dipstick: NEGATIVE
Ketones, ur: NEGATIVE mg/dL
Nitrite: NEGATIVE
Protein, ur: NEGATIVE mg/dL
Specific Gravity, Urine: 1.028 (ref 1.005–1.030)
WBC, UA: >50 WBC/hpf — ABNORMAL HIGH (ref 0–5)
pH: 5 (ref 5.0–8.0)

## 2021-08-17 LAB — COMPREHENSIVE METABOLIC PANEL
ALT: 28 U/L (ref 0–44)
AST: 19 U/L (ref 15–41)
Albumin: 4.7 g/dL (ref 3.5–5.0)
Alkaline Phosphatase: 86 U/L (ref 38–126)
Anion gap: 8 (ref 5–15)
BUN: 13 mg/dL (ref 6–20)
CO2: 26 mmol/L (ref 22–32)
Calcium: 9.6 mg/dL (ref 8.9–10.3)
Chloride: 107 mmol/L (ref 98–111)
Creatinine, Ser: 1.02 mg/dL (ref 0.61–1.24)
GFR, Estimated: >60 mL/min (ref >60)
Glucose, Bld: 115 mg/dL — ABNORMAL HIGH (ref 70–99)
Potassium: 4.6 mmol/L (ref 3.5–5.1)
Sodium: 141 mmol/L (ref 135–145)
Total Bilirubin: 1 mg/dL (ref 0.3–1.2)
Total Protein: 8.3 g/dL — ABNORMAL HIGH (ref 6.5–8.1)

## 2021-08-17 LAB — HIV ANTIBODY (ROUTINE TESTING W REFLEX): HIV Screen 4th Generation wRfx: NONREACTIVE

## 2021-08-17 MED ORDER — NAPROXEN 375 MG PO TABS: 375.0000 mg | ORAL_TABLET | Freq: Two times a day (BID) | ORAL | 0 refills | Status: DC

## 2021-08-17 MED ORDER — OXYCODONE HCL 5 MG PO TABS: 5.0000 mg | ORAL_TABLET | ORAL | 0 refills | Status: DC | PRN

## 2021-08-17 MED ORDER — CIPROFLOXACIN HCL 500 MG PO TABS
500.0000 mg | ORAL_TABLET | Freq: Once | ORAL | Status: AC
  Administered 2021-08-17: 500 mg via ORAL
  Filled 2021-08-17: qty 1

## 2021-08-17 MED ORDER — CIPROFLOXACIN HCL 500 MG PO TABS
500.0000 mg | ORAL_TABLET | Freq: Two times a day (BID) | ORAL | 0 refills | Status: DC
Start: 2021-08-17 — End: 2021-08-17

## 2021-08-17 MED ORDER — CIPROFLOXACIN HCL 500 MG PO TABS
500.0000 mg | ORAL_TABLET | Freq: Two times a day (BID) | ORAL | 0 refills | Status: AC
Start: 2021-08-17 — End: 2021-08-27

## 2021-08-17 MED ORDER — KETOROLAC TROMETHAMINE 15 MG/ML IJ SOLN
15.0000 mg | Freq: Once | INTRAMUSCULAR | Status: AC
  Administered 2021-08-17: 15 mg via INTRAMUSCULAR
  Filled 2021-08-17: qty 1

## 2021-08-17 NOTE — ED Provider Notes (Signed)
[facility]-EMERGENCY DEPT Provider Note  CSN: [id number] Arrival date & time: 08/17/21 1355  Chief Complaint(s) Testicle Pain  HPI Joseph Vazquez is a 31 y.o. male who presents emergency department for evaluation of right testicle pain.  Patient states that pain began at 130 this morning and has been gradually worsening throughout the day.  He states that he notices swelling to the testicle and it is tender to the touch.  He denies dysuria, increased frequency, penile discharge or trauma to the testicle.  Denies chest pain, shortness of breath, Donnell pain, nausea, vomiting or other systemic symptoms.   Past Medical History Past Medical History:  Diagnosis Date   Back pain    Patient Active Problem List   Diagnosis Date Noted   Protrusion of lumbar intervertebral disc 06/03/2020   Radiculopathy, lumbar region 06/03/2020   Right knee pain 07/05/2012   Nicotine addiction 12/01/2011   GERD 09/17/2008   OTHER SEBORRHEIC DERMATITIS 09/17/2008   HYPERHIDROSIS 09/17/2008   Home Medication(s) Prior to Admission medications   Medication Sig Start Date End Date Taking? Authorizing Provider  cyclobenzaprine (FLEXERIL) 5 MG tablet Take 1 tablet (5 mg total) by mouth 2 (two) times daily as needed for muscle spasms. 07/08/21   Gustavus Bryant, FNP  gabapentin (NEURONTIN) 100 MG capsule TAKE 1 CAPSULE (100 MG TOTAL) BY MOUTH THREE TIMES DAILY. 08/01/21   Persons, [place], [place]  gabapentin (NEURONTIN) 300 MG capsule Take 1 capsule (300 mg total) by mouth 3 (three) times daily. 07/15/21   Kerrin Champagne, MD  HYDROcodone-acetaminophen (NORCO) 7.5-325 MG tablet Take 1 tablet by mouth every 12 (twelve) hours as needed for moderate pain. Patient not taking: Reported on 08/17/2021 06/11/21   Kerrin Champagne, MD  HYDROcodone-acetaminophen (NORCO) 7.5-325 MG tablet Take 1 tablet by mouth every 6 (six) hours as needed for moderate pain. 08/11/21   Kerrin Champagne, MD                                                                                                                                     Past Surgical History Past Surgical History:  Procedure Laterality Date   VENTRAL HERNIA REPAIR N/A 08/07/2018   Procedure: HERNIA REPAIR umblical;  Surgeon: Berna Bue, MD;  Location: [facility];  Service: General;  Laterality: N/A;   Family History Family History  Problem Relation Age of Onset   Cancer Mother    Healthy Father     Social History Social History   Tobacco Use   Smoking status: Every Day    Packs/day: 0.05    Types: Cigarettes   Smokeless tobacco: Current  Vaping Use   Vaping Use: Never used  Substance Use Topics   Alcohol use: Yes    Comment: Socially   Drug use: Yes    Types: Marijuana   Allergies Patient has no known allergies.  Review of Systems Review of Systems  Genitourinary:  Positive for scrotal swelling and testicular pain.    Physical Exam Vital Signs  I have reviewed the triage vital signs BP (!) 142/94 (BP Location: Right Arm)   Pulse 85   Temp 99.1 F (37.3 C) (Oral)   Resp 16   SpO2 100%   Physical Exam Constitutional:      General: He is not in acute distress.    Appearance: Normal appearance.  HENT:     Head: Normocephalic and atraumatic.     Nose: No congestion or rhinorrhea.  Eyes:     General:        Right eye: No discharge.        Left eye: No discharge.     Extraocular Movements: Extraocular movements intact.     Pupils: Pupils are equal, round, and reactive to light.  Cardiovascular:     Rate and Rhythm: Normal rate and regular rhythm.     Heart sounds: No murmur heard. Pulmonary:     Effort: No respiratory distress.     Breath sounds: No wheezing or rales.  Abdominal:     General: There is no distension.     Tenderness: There is no abdominal tenderness.  Genitourinary:    Comments: Right-sided testicular swelling and tenderness upon elevation Musculoskeletal:        General: Normal range of motion.      Cervical back: Normal range of motion.  Skin:    General: Skin is warm and dry.  Neurological:     General: No focal deficit present.     Mental Status: He is alert.     ED Results and Treatments Labs (all labs ordered are listed, but only abnormal results are displayed) Labs Reviewed  CBC WITH DIFFERENTIAL/PLATELET - Abnormal; Notable for the following components:      Result Value   WBC 13.3 (*)    Neutro Abs 10.2 (*)    All other components within normal limits  COMPREHENSIVE METABOLIC PANEL - Abnormal; Notable for the following components:   Glucose, Bld 115 (*)    Total Protein 8.3 (*)    All other components within normal limits  URINALYSIS, ROUTINE W REFLEX MICROSCOPIC  RPR  HIV ANTIBODY (ROUTINE TESTING W REFLEX)  GC/CHLAMYDIA PROBE AMP ([place]) NOT AT [facility]                                                                                                                          Radiology US SCROTUM W/DOPPLER  Result Date: 08/17/2021 CLINICAL DATA:  Testicular pain EXAM: SCROTAL ULTRASOUND DOPPLER ULTRASOUND OF THE TESTICLES TECHNIQUE: Complete ultrasound examination of the testicles, epididymis, and other scrotal structures was performed. Color and spectral Doppler ultrasound were also utilized to evaluate blood flow to the testicles. COMPARISON:  Ultrasound 12/15/2011 FINDINGS: Right testicle Measurements: 4.7 x 2.8 x 3.2 cm. No mass or microlithiasis visualized. Left testicle Measurements: 4.1 x 2.4 x 3.0 cm. No mass or microlithiasis visualized. Right epididymis: Normal  in size. Likely benign hyperechoic nodule near the right epididymis measuring up to 3 mm and 5 mm. These may reflect scrotal pearl or focal fat tissue. Left epididymis: 2 epididymal cysts, measuring up to 5 mm and 4 mm. Likely benign hyperechoic nodular area measuring up to 4 mm. Could be a scrotal pearl or focal fatty tissue. Hydrocele:  Small bilateral hydroceles. Varicocele:  None visualized. Pulsed  Doppler interrogation of both testes demonstrates normal low resistance arterial and venous waveforms bilaterally. IMPRESSION: No evidence of testicular torsion or epididymo-orchitis. Small bilateral hydroceles. Electronically Signed   By: Caprice Renshaw M.D.   On: 08/17/2021 15:26    Pertinent labs & imaging results that were available during my care of the patient were reviewed by me and considered in my medical decision making (see MDM for details).  Medications Ordered in ED Medications  ketorolac (TORADOL) 15 MG/ML injection 15 mg (has no administration in time range)                                                                                                                                     Procedures Procedures  (including critical care time)  Medical Decision Making / ED Course   This patient presents to the ED for concern of testicle pain, this involves an extensive number of treatment options, and is a complaint that carries with it a high risk of complications and morbidity.  The differential diagnosis includes testicular torsion, hydrocele, varicocele, epididymitis, orchitis, UTI, STD  MDM: Patient seen emergency room for evaluation of right testicular pain.  Physical exam with right testicular swelling and pain with elevation but cremasteric reflex intact.  Laboratory evaluation with a leukocytosis to 13.3 with a neutrophilic predominance but is otherwise unremarkable.  GC chlamydia ordered and is pending.  Testicular ultrasound unremarkable outside of a small hydrocele.  Urinalysis with large leuk esterase, greater than 50 white blood cells but no bacteria seen.  The sterile pyuria may indicate developing prostatitis versus UTI with epididymo orchitis.  Patient will be started on ciprofloxacin and patient will require close urologic follow-up.  Patient then discharged with antibiotics and urology follow-up.   Additional history obtained:  -External records from outside  source obtained and reviewed including: Chart review including previous notes, labs, imaging, consultation notes   Lab Tests: -I ordered, reviewed, and interpreted labs.   The pertinent results include:   Labs Reviewed  CBC WITH DIFFERENTIAL/PLATELET - Abnormal; Notable for the following components:      Result Value   WBC 13.3 (*)    Neutro Abs 10.2 (*)    All other components within normal limits  COMPREHENSIVE METABOLIC PANEL - Abnormal; Notable for the following components:   Glucose, Bld 115 (*)    Total Protein 8.3 (*)    All other components within normal limits  URINALYSIS, ROUTINE W REFLEX MICROSCOPIC  RPR  HIV ANTIBODY (ROUTINE TESTING W REFLEX)  GC/CHLAMYDIA PROBE AMP ([place]) NOT AT [facility]     Imaging Studies ordered: I ordered imaging studies including testicular ultrasound I independently visualized and interpreted imaging. I agree with the radiologist interpretation   Medicines ordered and prescription drug management: Meds ordered this encounter  Medications   ketorolac (TORADOL) 15 MG/ML injection 15 mg    -I have reviewed the patients home medicines and have made adjustments as needed  Critical interventions none  Cardiac Monitoring: The patient was maintained on a cardiac monitor.  I personally viewed and interpreted the cardiac monitored which showed an underlying rhythm of: NSR  Social Determinants of Health:  Factors impacting patients care include: none   Reevaluation: After the interventions noted above, I reevaluated the patient and found that they have :improved  Co morbidities that complicate the patient evaluation  Past Medical History:  Diagnosis Date   Back pain       Dispostion: I considered admission for this patient, but with negative imaging, patient does not meet inpatient criteria for admission and he is safe for discharge with outpatient close urology follow-up with antibiotics     Final Clinical Impression(s) / ED  Diagnoses Final diagnoses:  None     @PCDICTATION @    , MD 08/17/21 2310

## 2021-08-17 NOTE — ED Notes (Signed)
Ice pack provided for pain and discomfort

## 2021-08-17 NOTE — ED Provider Notes (Signed)
[name]    CSN: [id number] Arrival date & time: 08/17/21  1220      History   Chief Complaint Chief Complaint  Patient presents with   Testicle Pain    HPI Joseph Vazquez is a 31 y.o. male.   Pt complains of pain in his right testicle.  Pt reports he noticed pain and swelling this am.  Pt reports significant discomfort   The history is provided by the patient. No language interpreter was used.  Testicle Pain This is a new problem. The current episode started 6 to 12 hours ago. The problem occurs constantly. The problem has been gradually worsening. Nothing aggravates the symptoms. Nothing relieves the symptoms.    Past Medical History:  Diagnosis Date   Back pain     Patient Active Problem List   Diagnosis Date Noted   Protrusion of lumbar intervertebral disc 06/03/2020   Radiculopathy, lumbar region 06/03/2020   Right knee pain 07/05/2012   Nicotine addiction 12/01/2011   GERD 09/17/2008   OTHER SEBORRHEIC DERMATITIS 09/17/2008   HYPERHIDROSIS 09/17/2008    Past Surgical History:  Procedure Laterality Date   VENTRAL HERNIA REPAIR N/A 08/07/2018   Procedure: HERNIA REPAIR umblical;  Surgeon: Berna Bue, MD;  Location: [facility];  Service: General;  Laterality: N/A;       Home Medications    Prior to Admission medications   Medication Sig Start Date End Date Taking? Authorizing Provider  cyclobenzaprine (FLEXERIL) 5 MG tablet Take 1 tablet (5 mg total) by mouth 2 (two) times daily as needed for muscle spasms. 07/08/21   Gustavus Bryant, FNP  gabapentin (NEURONTIN) 100 MG capsule TAKE 1 CAPSULE (100 MG TOTAL) BY MOUTH THREE TIMES DAILY. 08/01/21   Persons, [place], [place]  gabapentin (NEURONTIN) 300 MG capsule Take 1 capsule (300 mg total) by mouth 3 (three) times daily. 07/15/21   Kerrin Champagne, MD  HYDROcodone-acetaminophen (NORCO) 7.5-325 MG tablet Take 1 tablet by mouth every 12 (twelve) hours as needed for moderate pain. Patient not taking:  Reported on 08/17/2021 06/11/21   Kerrin Champagne, MD  HYDROcodone-acetaminophen (NORCO) 7.5-325 MG tablet Take 1 tablet by mouth every 6 (six) hours as needed for moderate pain. 08/11/21   Kerrin Champagne, MD    Family History Family History  Problem Relation Age of Onset   Cancer Mother    Healthy Father     Social History Social History   Tobacco Use   Smoking status: Every Day    Packs/day: 0.05    Types: Cigarettes   Smokeless tobacco: Current  Vaping Use   Vaping Use: Never used  Substance Use Topics   Alcohol use: Yes    Comment: Socially   Drug use: Yes    Types: Marijuana     Allergies   Patient has no known allergies.   Review of Systems Review of Systems  Genitourinary:  Positive for testicular pain.  All other systems reviewed and are negative.    Physical Exam Triage Vital Signs ED Triage Vitals  Enc Vitals Group     BP 08/17/21 1241 (!) 149/96     Pulse Rate 08/17/21 1241 92     Resp 08/17/21 1241 18     Temp 08/17/21 1241 97.9 F (36.6 C)     Temp Source 08/17/21 1241 Oral     SpO2 08/17/21 1241 98 %     Weight --      Height --  Head Circumference --      Peak Flow --      Pain Score 08/17/21 1242 7     Pain Loc --      Pain Edu? --      Excl. in GC? --    No data found.  Updated Vital Signs BP (!) 149/96 (BP Location: Left Arm)   Pulse 92   Temp 97.9 F (36.6 C) (Oral)   Resp 18   SpO2 98%   Visual Acuity Right Eye Distance:   Left Eye Distance:   Bilateral Distance:    Right Eye Near:   Left Eye Near:    Bilateral Near:     Physical Exam Vitals and nursing note reviewed.  Constitutional:      Appearance: He is well-developed.  HENT:     Head: Normocephalic.  Pulmonary:     Effort: Pulmonary effort is normal.  Abdominal:     General: There is no distension.  Genitourinary:    Comments: Tender right scrotum and testicle,   Musculoskeletal:        General: Normal range of motion.     Cervical back: Normal range of  motion.  Neurological:     Mental Status: He is alert and oriented to person, place, and time.  Psychiatric:        Mood and Affect: Mood normal.      UC Treatments / Results  Labs (all labs ordered are listed, but only abnormal results are displayed) Labs Reviewed - No data to display  EKG   Radiology No results found.  Procedures Procedures (including critical care time)  Medications Ordered in UC Medications - No data to display  Initial Impression / Assessment and Plan / UC Course  I have reviewed the triage vital signs and the nursing notes.  Pertinent labs & imaging results that were available during my care of the patient were reviewed by me and considered in my medical decision making (see chart for details).     MDM:  Pt advised to go to ED for evaluation.   Final Clinical Impressions(s) / UC Diagnoses   Final diagnoses:  Pain in right testicle     Discharge Instructions      Go to the Emergency department to be seen for evaluation.     ED Prescriptions   None    PDMP not reviewed this encounter. An After Visit Summary was printed and given to the patient.    Elson Areas, [place] 08/17/21 1303

## 2021-08-17 NOTE — ED Triage Notes (Signed)
Pt here for right testicle pain and swelling upon waking this am; pt sts "my semen is different color"; pt sts also had headache x 4 days

## 2021-08-17 NOTE — ED Notes (Signed)
Patient is being discharged from the Urgent Care and sent to the Emergency Department via POV . Per KS, patient is in need of higher level of care due to testicle pain. Patient is aware and verbalizes understanding of plan of care.  Vitals:   08/17/21 1241  BP: (!) 149/96  Pulse: 92  Resp: 18  Temp: 97.9 F (36.6 C)  SpO2: 98%

## 2021-08-17 NOTE — ED Triage Notes (Signed)
Pt presents with c/o testicle pain on the right side that started this morning. Pt reports he feels like it is swollen and slightly blue in color. Pt says he was sent here for an [place].

## 2021-08-17 NOTE — Discharge Instructions (Signed)
Go to the Emergency department to be seen for evaluation.

## 2021-08-17 NOTE — ED Provider Triage Note (Signed)
Emergency Medicine Provider Triage Evaluation Note  Nickalous Stingley , a 31 y.o. male  was evaluated in triage.  Pt complains of testicular pain. Report pain to R testicle this AM.  No fever, no dysuria, no new sexual partner. Seen at [facility] and sent here for further eval   Review of Systems  Positive: As above Negative: As above  Physical Exam  BP (!) 142/94 (BP Location: Right Arm)   Pulse 85   Temp 99.1 F (37.3 C) (Oral)   Resp 16   SpO2 100%  Gen:   Awake, no distress   Resp:  Normal effort  MSK:   Moves extremities without difficulty  Other:    Medical Decision Making  Medically screening exam initiated at 2:15 PM.  Appropriate orders placed.  Judas Mohammad was informed that the remainder of the evaluation will be completed by another provider, this initial triage assessment does not replace that evaluation, and the importance of remaining in the ED until their evaluation is complete.     Fayrene Helper, PA-C 08/17/21 1418

## 2021-08-18 LAB — RPR: RPR Ser Ql: NONREACTIVE

## 2021-08-26 ENCOUNTER — Other Ambulatory Visit: Payer: Self-pay | Admitting: Radiology

## 2021-08-26 ENCOUNTER — Encounter: Payer: Self-pay | Admitting: Specialist

## 2021-08-27 MED ORDER — GABAPENTIN 300 MG PO CAPS: 300.0000 mg | ORAL_CAPSULE | Freq: Three times a day (TID) | ORAL | 3 refills | Status: DC

## 2021-09-11 ENCOUNTER — Ambulatory Visit: Payer: Self-pay

## 2021-09-11 ENCOUNTER — Encounter: Payer: Self-pay | Admitting: Specialist

## 2021-09-11 ENCOUNTER — Ambulatory Visit (INDEPENDENT_AMBULATORY_CARE_PROVIDER_SITE_OTHER): Payer: PRIVATE HEALTH INSURANCE | Admitting: Specialist

## 2021-09-11 VITALS — BP 166/116 | HR 103 | Ht 72.0 in | Wt 240.0 lb

## 2021-09-11 DIAGNOSIS — M542 Cervicalgia: Secondary | ICD-10-CM | POA: Diagnosis not present

## 2021-09-11 DIAGNOSIS — M545 Low back pain, unspecified: Secondary | ICD-10-CM

## 2021-09-11 DIAGNOSIS — M4200 Juvenile osteochondrosis of spine, site unspecified: Secondary | ICD-10-CM | POA: Diagnosis not present

## 2021-09-11 DIAGNOSIS — M4316 Spondylolisthesis, lumbar region: Secondary | ICD-10-CM

## 2021-09-11 DIAGNOSIS — M5126 Other intervertebral disc displacement, lumbar region: Secondary | ICD-10-CM

## 2021-09-11 DIAGNOSIS — G8929 Other chronic pain: Secondary | ICD-10-CM

## 2021-09-11 MED ORDER — GABAPENTIN 300 MG PO CAPS: 300.0000 mg | ORAL_CAPSULE | Freq: Three times a day (TID) | ORAL | 6 refills | Status: DC

## 2021-09-11 NOTE — Patient Instructions (Signed)
Avoid frequent bending and stooping  No lifting greater than 10 lbs. May use ice or moist heat for pain. Weight loss is of benefit. Best medication for lumbar disc disease is arthritis medications like motrin, celebrex and naprosyn. Exercise is important to improve your indurance and does allow people to function better inspite of back pain. Check with [facility] [phone] operator and ask for financial assistance

## 2021-09-11 NOTE — Progress Notes (Signed)
Office Visit Note   Patient: Joseph Vazquez           Date of Birth: 06/15/1990           MRN: [id number] Visit Date: 09/11/2021              Requested by: Deatra Ginelle Bays, MD [phone] [place],  [place] [address] PCP: Deatra Jermel Artley, MD   Assessment & Plan: Visit Diagnoses:  1. Chronic low back pain, unspecified back pain laterality, unspecified whether sciatica present   2. Cervicalgia   3. Spondylolisthesis, lumbar region   4. Scheurmann's disease   5. Protrusion of lumbar intervertebral disc     Plan: Avoid frequent bending and stooping  No lifting greater than 10 lbs. May use ice or moist heat for pain. Weight loss is of benefit. Best medication for lumbar disc disease is arthritis medications like motrin, celebrex and naprosyn. Exercise is important to improve your indurance and does allow people to function better inspite of back pain. Check with [facility] [phone] operator and ask for financial assistance  Follow-Up Instructions: Return in about 6 weeks (around 10/23/2021).   Orders:  No orders of the defined types were placed in this encounter.  Meds ordered this encounter  Medications   DISCONTD: gabapentin (NEURONTIN) 300 MG capsule    Sig: Take 1 capsule (300 mg total) by mouth 3 (three) times daily.    Dispense:  90 capsule    Refill:  6      Procedures: No procedures performed   Clinical Data: Findings:  MR Lumbar Spine w/o contrast (Accession [id number]) (Order [id number]) Imaging Date: 03/20/2021 Department: [facility] AT [address] Released By: Broadus John Authorizing: Naida Sleight, PA-C  Exam Status  Status Final [99]  PACS Intelerad Image Link   Show images for MR Lumbar Spine w/o contrast  Study Result  Narrative & Impression CLINICAL DATA:  Initial evaluation for chronic and worsening lower back pain with left lower extremity radiculopathy.   EXAM: MRI LUMBAR SPINE WITHOUT  CONTRAST   TECHNIQUE: Multiplanar, multisequence MR imaging of the lumbar spine was performed. No intravenous contrast was administered.   COMPARISON:  MRI from 04/10/2020.   FINDINGS: Segmentation: Standard. Lowest well-formed disc space labeled the L5-S1 level.   Alignment: Trace retrolisthesis of L4 on L5 with straightening of the normal lumbar lordosis, stable.   Vertebrae: Vertebral body height maintained without acute or chronic fracture. Multiple chronic endplate Schmorl's node deformities noted throughout the lumbar spine trauma stable. Bone marrow signal intensity within normal limits. No worrisome osseous lesions. Mild discogenic reactive endplate change present about the L4-5 and L5-S1 interspaces. No other abnormal marrow edema.   Conus medullaris and cauda equina: Conus extends to the T12-L1 level. Conus and cauda equina appear normal.   Paraspinal and other soft tissues: Unremarkable.   Disc levels:   L1-2: Minimal annular disc bulge with chronic endplate Schmorl's node deformities. No canal or foraminal stenosis.   L2-3: Mild intervertebral disc space narrowing with disc desiccation and circumferential disc bulge. Chronic endplate Schmorl's node deformities. No spinal stenosis. Foramina remain patent.   L3-4: Chronic endplate Schmorl's node deformity without significant disc bulge. Mild facet hypertrophy. No canal or foraminal stenosis.   L4-5: Moderate intervertebral disc space narrowing with disc desiccation and diffuse disc bulge. Associated reactive endplate change with marginal endplate osteophytic spurring and chronic Schmorl's node deformity. Superimposed central disc protrusion indents the ventral thecal sac (series  6, image 35). Associated slight inferior migration. Mild facet and ligament flavum hypertrophy with associated trace joint effusions. Resultant mild narrowing of the lateral recesses bilaterally, descending L5 nerve root level.  Foramina remain patent. Appearance is stable.   L5-S1: Moderate intervertebral disc space narrowing with disc desiccation and diffuse disc bulge. Reactive endplate spurring. A previously seen central to left subarticular disc protrusion has partially regressed since previous, now smaller in size (series 6, image 41). Persistent central and left annular fissures. Improved lateral recess stenosis, with relatively mild lateral recess narrowing now seen on the left. No spinal stenosis. Foramina remain patent.   IMPRESSION: 1. Partial interval regression of previously seen central to left subarticular disc protrusion at L5-S1, with improved mild left lateral recess stenosis. 2. Stable central disc protrusion at L4-5 with resultant mild bilateral subarticular stenosis, potentially affecting either of the descending L5 nerve roots. 3. Additional mild spondylosis at L2-3 and L3-4 without stenosis or impingement, stable.     Electronically Signed   By: Rise Mu M.D.   On: 03/21/2021 04:37      Subjective: Chief Complaint  Patient presents with   Lower Back - Pain    31 year old male with history of multiple level lumbar degenerative disc disease with disc protrusions at several lumbar sites, L3-4, L4-5 and L5-S1. No bowel or bladder difficulty. He can walk uncomfortably but can walk. Sitting and riding in the car are painful.     Review of Systems  Constitutional: Negative.   HENT: Negative.    Eyes: Negative.   Respiratory: Negative.    Cardiovascular: Negative.   Gastrointestinal: Negative.   Endocrine: Negative.   Genitourinary: Negative.   Musculoskeletal: Negative.   Skin: Negative.   Allergic/Immunologic: Negative.   Neurological: Negative.   Hematological: Negative.   Psychiatric/Behavioral: Negative.       Objective: Vital Signs: BP (!) 166/116   Pulse (!) 103   Ht 6' (1.829 m)   Wt 240 lb (108.9 kg)   BMI 32.55 kg/m   Physical  Exam Musculoskeletal:     Lumbar back: Negative right straight leg raise test and negative left straight leg raise test.    Back Exam   Tenderness  The patient is experiencing tenderness in the lumbar.  Range of Motion  Extension:  abnormal  Flexion:  abnormal  Lateral bend right:  abnormal  Lateral bend left:  abnormal  Rotation right:  abnormal  Rotation left:  abnormal   Muscle Strength  Right Quadriceps:  5/5  Left Quadriceps:  5/5  Right Hamstrings:  5/5  Left Hamstrings:  5/5   Tests  Straight leg raise right: negative Straight leg raise left: negative  Reflexes  Patellar:  2/4 Achilles:  2/4  Other  Toe walk: normal Heel walk: normal     Specialty Comments:  No specialty comments available.  Imaging: No results found.   PMFS History: Patient Active Problem List   Diagnosis Date Noted   Protrusion of lumbar intervertebral disc 06/03/2020   Radiculopathy, lumbar region 06/03/2020   Right knee pain 07/05/2012   Nicotine addiction 12/01/2011   GERD 09/17/2008   OTHER SEBORRHEIC DERMATITIS 09/17/2008   HYPERHIDROSIS 09/17/2008   Past Medical History:  Diagnosis Date   Back pain     Family History  Problem Relation Age of Onset   Cancer Mother    Healthy Father     Past Surgical History:  Procedure Laterality Date   VENTRAL HERNIA REPAIR N/A 08/07/2018  Procedure: HERNIA REPAIR umblical;  Surgeon: Berna Bue, MD;  Location: [facility];  Service: General;  Laterality: N/A;   Social History   Occupational History   Not on file  Tobacco Use   Smoking status: Every Day    Packs/day: 0.05    Types: Cigarettes   Smokeless tobacco: Current  Vaping Use   Vaping Use: Never used  Substance and Sexual Activity   Alcohol use: Yes    Comment: Socially   Drug use: Yes    Types: Marijuana   Sexual activity: Yes

## 2021-09-25 ENCOUNTER — Other Ambulatory Visit: Payer: Self-pay | Admitting: Radiology

## 2021-09-25 NOTE — Telephone Encounter (Signed)
error 

## 2021-10-02 NOTE — Telephone Encounter (Signed)
Printed and gave to JN 

## 2021-10-03 ENCOUNTER — Other Ambulatory Visit: Payer: Self-pay | Admitting: Specialist

## 2021-10-03 MED ORDER — GABAPENTIN 300 MG PO CAPS: ORAL_CAPSULE | ORAL | 0 refills | Status: DC

## 2021-10-03 NOTE — Telephone Encounter (Signed)
Pt called and he needs a call back from someone talking about whats below.

## 2021-10-09 ENCOUNTER — Encounter: Payer: Self-pay | Admitting: Specialist

## 2021-11-05 ENCOUNTER — Other Ambulatory Visit: Payer: Self-pay | Admitting: Specialist

## 2021-11-07 ENCOUNTER — Other Ambulatory Visit: Payer: Self-pay | Admitting: Specialist

## 2021-11-10 ENCOUNTER — Other Ambulatory Visit: Payer: Self-pay | Admitting: Specialist

## 2021-11-10 MED ORDER — GABAPENTIN 300 MG PO CAPS: ORAL_CAPSULE | ORAL | 0 refills | Status: DC

## 2021-11-10 MED ORDER — GABAPENTIN 300 MG PO CAPS: 600.0000 mg | ORAL_CAPSULE | Freq: Three times a day (TID) | ORAL | 0 refills | Status: DC

## 2021-11-20 NOTE — Telephone Encounter (Signed)
Printed and gave to [facility] to review and advised

## 2021-12-18 ENCOUNTER — Telehealth: Payer: Self-pay | Admitting: Surgery

## 2021-12-18 NOTE — Telephone Encounter (Signed)
Pt called in stating that he saw Dr. Otelia Sergeant and PA Barry Dienes... Pt requesting a referral for Spine Specialist... Pt stated he would like to go to [facility], Dr. Larry Sierras, [phone]... Pt requesting callback.Marland KitchenMarland Kitchen

## 2021-12-18 NOTE — Telephone Encounter (Signed)
Please advise. OK for referral since Dr. Otelia Sergeant has retired?

## 2021-12-19 ENCOUNTER — Other Ambulatory Visit: Payer: Self-pay

## 2021-12-19 DIAGNOSIS — M4316 Spondylolisthesis, lumbar region: Secondary | ICD-10-CM

## 2021-12-19 DIAGNOSIS — M542 Cervicalgia: Secondary | ICD-10-CM

## 2021-12-19 DIAGNOSIS — M545 Low back pain, unspecified: Secondary | ICD-10-CM

## 2021-12-19 DIAGNOSIS — M48062 Spinal stenosis, lumbar region with neurogenic claudication: Secondary | ICD-10-CM

## 2021-12-19 NOTE — Telephone Encounter (Signed)
Referral sent 

## 2022-01-05 ENCOUNTER — Other Ambulatory Visit: Payer: Self-pay | Admitting: Specialist

## 2022-01-08 ENCOUNTER — Other Ambulatory Visit: Payer: Self-pay | Admitting: Specialist

## 2022-01-09 ENCOUNTER — Other Ambulatory Visit: Payer: Self-pay | Admitting: Orthopaedic Surgery

## 2022-01-09 ENCOUNTER — Other Ambulatory Visit: Payer: Self-pay | Admitting: Specialist

## 2022-01-09 MED ORDER — HYDROCODONE-ACETAMINOPHEN 7.5-325 MG PO TABS: 1.0000 | ORAL_TABLET | Freq: Four times a day (QID) | ORAL | 0 refills | Status: DC | PRN

## 2022-01-09 MED ORDER — GABAPENTIN 300 MG PO CAPS: 600.0000 mg | ORAL_CAPSULE | Freq: Three times a day (TID) | ORAL | 0 refills | Status: DC

## 2022-01-09 NOTE — Telephone Encounter (Signed)
I would check with [name]

## 2022-02-17 ENCOUNTER — Other Ambulatory Visit: Payer: Self-pay

## 2022-02-17 ENCOUNTER — Encounter (HOSPITAL_BASED_OUTPATIENT_CLINIC_OR_DEPARTMENT_OTHER): Payer: Self-pay

## 2022-02-17 ENCOUNTER — Ambulatory Visit
Admission: EM | Admit: 2022-02-17 | Discharge: 2022-02-17 | Disposition: A | Discharge status: Home / Self Care | Payer: Commercial Managed Care - HMO

## 2022-02-17 ENCOUNTER — Emergency Department (HOSPITAL_BASED_OUTPATIENT_CLINIC_OR_DEPARTMENT_OTHER): Payer: Commercial Managed Care - HMO

## 2022-02-17 ENCOUNTER — Emergency Department (HOSPITAL_BASED_OUTPATIENT_CLINIC_OR_DEPARTMENT_OTHER)
Admission: EM | Admit: 2022-02-17 | Discharge: 2022-02-17 | Disposition: A | Discharge status: Home / Self Care | Payer: Commercial Managed Care - HMO | Attending: Emergency Medicine | Admitting: Emergency Medicine

## 2022-02-17 ENCOUNTER — Encounter: Payer: Self-pay | Admitting: Emergency Medicine

## 2022-02-17 DIAGNOSIS — S31130A Puncture wound of abdominal wall without foreign body, right upper quadrant without penetration into peritoneal cavity, initial encounter: Secondary | ICD-10-CM | POA: Diagnosis not present

## 2022-02-17 DIAGNOSIS — S3991XA Unspecified injury of abdomen, initial encounter: Secondary | ICD-10-CM | POA: Diagnosis present

## 2022-02-17 DIAGNOSIS — Y92009 Unspecified place in unspecified non-institutional (private) residence as the place of occurrence of the external cause: Secondary | ICD-10-CM | POA: Diagnosis not present

## 2022-02-17 DIAGNOSIS — W260XXA Contact with knife, initial encounter: Secondary | ICD-10-CM | POA: Insufficient documentation

## 2022-02-17 DIAGNOSIS — S31119A Laceration without foreign body of abdominal wall, unspecified quadrant without penetration into peritoneal cavity, initial encounter: Secondary | ICD-10-CM

## 2022-02-17 DIAGNOSIS — S31139A Puncture wound of abdominal wall without foreign body, unspecified quadrant without penetration into peritoneal cavity, initial encounter: Secondary | ICD-10-CM

## 2022-02-17 LAB — CBC
HCT: 40.1 % (ref 39.0–52.0)
Hemoglobin: 14.4 g/dL (ref 13.0–17.0)
MCH: 31.5 pg (ref 26.0–34.0)
MCHC: 35.9 g/dL (ref 30.0–36.0)
MCV: 87.7 fL (ref 80.0–100.0)
Platelets: 363 10*3/uL (ref 150–400)
RBC: 4.57 MIL/uL (ref 4.22–5.81)
RDW: 12.6 % (ref 11.5–15.5)
WBC: 9 10*3/uL (ref 4.0–10.5)
nRBC: 0 % (ref 0.0–0.2)

## 2022-02-17 LAB — BASIC METABOLIC PANEL
Anion gap: 11 (ref 5–15)
BUN: 13 mg/dL (ref 6–20)
CO2: 25 mmol/L (ref 22–32)
Calcium: 9.6 mg/dL (ref 8.9–10.3)
Chloride: 101 mmol/L (ref 98–111)
Creatinine, Ser: 1 mg/dL (ref 0.61–1.24)
GFR, Estimated: >60 mL/min (ref >60)
Glucose, Bld: 81 mg/dL (ref 70–99)
Potassium: 4.2 mmol/L (ref 3.5–5.1)
Sodium: 137 mmol/L (ref 135–145)

## 2022-02-17 MED ORDER — IOHEXOL 300 MG/ML  SOLN
100.0000 mL | Freq: Once | INTRAMUSCULAR | Status: AC | PRN
  Administered 2022-02-17: 80 mL via INTRAVENOUS

## 2022-02-17 MED ORDER — LIDOCAINE-EPINEPHRINE (PF) 2 %-1:200000 IJ SOLN
10.0000 mL | Freq: Once | INTRAMUSCULAR | Status: AC
  Administered 2022-02-17: 10 mL
  Filled 2022-02-17: qty 20

## 2022-02-17 NOTE — ED Triage Notes (Signed)
Patient here POV from Home.  Endorses a Puncture Wound to Right Mid ABD resulting from a Pocket Knife approximately 1030 today. Inserted approximately 1 inch. Sent by UC for Further Assessment. Tetanus UTD.    NAD Noted during Triage. A&O4. GCS 15. Ambulatory.

## 2022-02-17 NOTE — ED Notes (Signed)
Pt discharged to home. Discharge instructions have been discussed with patient and/or family members. Pt verbally acknowledges understanding d/c instructions, and endorses comprehension to checkout at registration before leaving.  °

## 2022-02-17 NOTE — ED Provider Notes (Signed)
[facility] EMERGENCY DEPT Provider Note   CSN: [id number] Arrival date & time: 02/17/22  1315     History  Chief Complaint  Patient presents with   Puncture Wound    Joseph Vazquez is a 32 y.o. male.  Patient presents the emergency department for evaluation of a puncture wound to the right upper abdomen.  Patient states he was trying to open a package with a small pocket when he slipped, stabbing himself with a pocket knife.  The patient should he management with a pocket knife showing penetration of approximately 1.25 cm.  The patient denies any shortness of breath.  Bleeding is controlled at this time.  The patient states his last tetanus shot was within the last 2 years.  The patient was seen in urgent care earlier today who recommended he come to the emergency department for advanced imaging prior to any attempted repair.  Past medical history significant for back pain  HPI     Home Medications Prior to Admission medications   Medication Sig Start Date End Date Taking? Authorizing Provider  cyclobenzaprine (FLEXERIL) 5 MG tablet Take 1 tablet (5 mg total) by mouth 2 (two) times daily as needed for muscle spasms. Patient not taking: Reported on 08/17/2021 07/08/21   Gustavus Bryant, FNP  gabapentin (NEURONTIN) 300 MG capsule Take 2 capsules (600 mg total) by mouth 3 (three) times daily. 01/09/22 04/09/22  Kathryne Hitch, MD  HYDROcodone-acetaminophen (NORCO) 7.5-325 MG tablet Take 1 tablet by mouth every 12 (twelve) hours as needed for moderate pain. Patient not taking: Reported on 08/17/2021 06/11/21   Kerrin Champagne, MD  HYDROcodone-acetaminophen (NORCO) 7.5-325 MG tablet Take 1 tablet by mouth every 6 (six) hours as needed for moderate pain. 01/09/22   Kathryne Hitch, MD  naproxen (NAPROSYN) 375 MG tablet Take 1 tablet (375 mg total) by mouth 2 (two) times daily. 08/17/21   Kommor, Madison, MD  oxyCODONE (ROXICODONE) 5 MG immediate release tablet Take 1 tablet (5  mg total) by mouth every 4 (four) hours as needed for severe pain. 08/17/21   Kommor, Wyn Forster, MD      Allergies    Patient has no known allergies.    Review of Systems   Review of Systems  Respiratory:  Negative for shortness of breath.   Skin:  Positive for wound.    Physical Exam Updated Vital Signs BP (!) 127/94   Pulse 75   Temp 98.1 F (36.7 C) (Oral)   Resp 18   Ht 6' (1.829 m)   Wt 108.9 kg   SpO2 97%   BMI 32.56 kg/m  Physical Exam HENT:     Head: Normocephalic and atraumatic.  Eyes:     Pupils: Pupils are equal, round, and reactive to light.  Cardiovascular:     Rate and Rhythm: Normal rate and regular rhythm.  Pulmonary:     Effort: Pulmonary effort is normal. No respiratory distress.     Breath sounds: Normal breath sounds.  Musculoskeletal:        General: No signs of injury.     Cervical back: Normal range of motion.  Skin:    General: Skin is dry.       Neurological:     Mental Status: He is alert.  Psychiatric:        Speech: Speech normal.        Behavior: Behavior normal.     ED Results / Procedures / Treatments   Labs (all labs ordered  are listed, but only abnormal results are displayed) Labs Reviewed  BASIC METABOLIC PANEL  CBC    EKG None  Radiology CT ABDOMEN W CONTRAST  Result Date: 02/17/2022 CLINICAL DATA:  Penetrating stab wound to the abdomen. EXAM: CT ABDOMEN WITH CONTRAST TECHNIQUE: Multidetector CT imaging of the abdomen was performed using the standard protocol following bolus administration of intravenous contrast. RADIATION DOSE REDUCTION: This exam was performed according to the departmental dose-optimization program which includes automated exposure control, adjustment of the mA and/or kV according to patient size and/or use of iterative reconstruction technique. CONTRAST:  19mL OMNIPAQUE IOHEXOL 300 MG/ML  SOLN COMPARISON:  07/07/2019 FINDINGS: Lower chest: The lung bases are clear of acute process. No pleural effusion or  pulmonary lesions. The heart is normal in size. No pericardial effusion. The distal esophagus and aorta are unremarkable. Hepatobiliary: No acute hepatic injury or perihepatic hematoma. No hepatic lesions or intrahepatic biliary dilatation. The gallbladder is unremarkable. Pancreas: No mass, inflammation or ductal dilatation. Spleen: Within normal limits in size.  No focal lesions. Adrenals/Urinary Tract: Adrenal glands and kidneys are normal. Stomach/Bowel: Stomach, duodenum, visualized small bowel and visualized colon are unremarkable. Vascular/Lymphatic: The aorta and branch vessels are normal. The major venous structures are normal. No mesenteric or retroperitoneal mass, adenopathy or hematoma. Other: I do not see any obvious subcutaneous wound or hematoma. The rectus muscles are normal. No intramuscular hematoma or gas. No intra-abdominal fluid or gas. Musculoskeletal: No significant bony findings. IMPRESSION: 1. No acute abdominal findings, mass lesions or adenopathy. 2. I do not see any obvious subcutaneous wound or hematoma. The rectus muscles are normal. No intramuscular hematoma or gas. No intra-abdominal fluid or gas. Electronically Signed   By: Marijo Sanes M.D.   On: 02/17/2022 15:10    Procedures .Marland KitchenLaceration Repair  Date/Time: 02/17/2022 3:27 PM  Performed by: Dorothyann Peng, PA-C Authorized by: Dorothyann Peng, PA-C   Consent:    Consent obtained:  Verbal   Consent given by:  Patient   Risks, benefits, and alternatives were discussed: yes     Risks discussed:  Infection, pain, poor cosmetic result, poor wound healing and need for additional repair   Alternatives discussed:  No treatment Universal protocol:    Procedure explained and questions answered to patient or proxy's satisfaction: yes     Relevant documents present and verified: yes     Imaging studies available: yes     Required blood products, implants, devices, and special equipment available: yes     Patient identity  confirmed:  Verbally with patient Anesthesia:    Anesthesia method:  Local infiltration   Local anesthetic:  Lidocaine 2% WITH epi Laceration details:    Location:  Trunk   Trunk location:  RUQ abd   Length (cm):  0.5   Depth (mm):  12 Pre-procedure details:    Preparation:  Patient was prepped and draped in usual sterile fashion Exploration:    Hemostasis achieved with:  Direct pressure and epinephrine   Imaging obtained comment:  CT scan   Imaging outcome: foreign body not noted     Wound exploration: wound explored through full range of motion and entire depth of wound visualized     Contaminated: no   Treatment:    Area cleansed with:  Saline   Amount of cleaning:  Standard   Irrigation solution:  Sterile saline   Irrigation method:  Syringe Skin repair:    Repair method:  Sutures   Suture size:  4-0  Suture material:  Prolene   Suture technique:  Simple interrupted   Number of sutures:  2 Approximation:    Approximation:  Close Repair type:    Repair type:  Intermediate Post-procedure details:    Dressing:  Sterile dressing   Procedure completion:  Tolerated well, no immediate complications     Medications Ordered in ED Medications  iohexol (OMNIPAQUE) 300 MG/ML solution 100 mL (80 mLs Intravenous Contrast Given 02/17/22 1445)  lidocaine-EPINEPHrine (XYLOCAINE W/EPI) 2 %-1:200000 (PF) injection 10 mL (10 mLs Infiltration Given by Other 02/17/22 1531)    ED Course/ Medical Decision Making/ A&P                           Medical Decision Making Amount and/or Complexity of Data Reviewed Labs: ordered. Radiology: ordered.  Risk Prescription drug management.   This patient presents to the ED for concern of a puncture wound, this involves an extensive number of treatment options, and is a complaint that carries with it a high risk of complications and morbidity.   Co morbidities that complicate the patient evaluation  None   Additional history  obtained:  Additional history obtained from visitor at bedside External records from outside source obtained and reviewed including urgent care notes from earlier today   Lab Tests:  I Ordered, and personally interpreted labs.  The pertinent results include: Unremarkable BMP and CBC   Imaging Studies ordered:  I ordered imaging studies including CT abdomen with contrast I independently visualized and interpreted imaging which showed no acute abdominal findings, mass lesions, or adenopathy.  No obvious subcutaneous wound or hematoma.  Normal rectus muscles.  No intramuscular hematoma or gas.  No intra-abdominal fluid or gas. I agree with the radiologist interpretation  Problem List / ED Course / Critical interventions / Medication management   I ordered medication including lidocaine with epinephrine for local anesthetic I have reviewed the patients home medicines and have made adjustments as needed   Test / Admission - Considered:  Patient with a penetrating wound to the right upper abdomen.  CT scan shows no subcutaneous wound or hematoma with normal underlying structures.  Wound was copiously irrigated with sterile saline and closed using 2 simple interrupted sutures.  These should be removed in approximately 7 to 10 days.  Patient given strict return precautions including any abdominal pain which is out of portion to local wound.  The patient's tetanus vaccination status was reported to be up-to-date.        Final Clinical Impression(s) / ED Diagnoses Final diagnoses:  Puncture wound of abdomen, initial encounter    Rx / DC Orders ED Discharge Orders     None         Pamala Duffel 02/17/22 1609    Jacalyn Lefevre, MD 02/18/22 1012

## 2022-02-17 NOTE — Discharge Instructions (Signed)
Go to the emergency department as soon as you leave urgent care for further evaluation and management. 

## 2022-02-17 NOTE — ED Notes (Signed)
Patient transported to CT 

## 2022-02-17 NOTE — ED Triage Notes (Signed)
Pt presents with abdominal wound after getting cut with knife at work around 1030 this am.

## 2022-02-17 NOTE — ED Provider Notes (Signed)
[facility]    CSN: [id number] Arrival date & time: 02/17/22  1140      History   Chief Complaint Chief Complaint  Patient presents with   Abdominal Pain   Laceration    HPI Joseph Vazquez is a 32 y.o. male.   Patient presents with laceration to abdomen that occurred around 10:30 AM this morning.  Patient reports that he was using a pocket knife to cut open a package when it slipped and went into his abdomen.  He reports that it went in approximately 1 inch.  Last tetanus vaccine was in the past 5 years per patient.  Has not taken any blood thinning medications.   Abdominal Pain Laceration   Past Medical History:  Diagnosis Date   Back pain     Patient Active Problem List   Diagnosis Date Noted   Protrusion of lumbar intervertebral disc 06/03/2020   Radiculopathy, lumbar region 06/03/2020   Right knee pain 07/05/2012   Nicotine addiction 12/01/2011   GERD 09/17/2008   OTHER SEBORRHEIC DERMATITIS 09/17/2008   HYPERHIDROSIS 09/17/2008    Past Surgical History:  Procedure Laterality Date   VENTRAL HERNIA REPAIR N/A 08/07/2018   Procedure: HERNIA REPAIR umblical;  Surgeon: Clovis Riley, MD;  Location: [facility];  Service: General;  Laterality: N/A;       Home Medications    Prior to Admission medications   Medication Sig Start Date End Date Taking? Authorizing Provider  cyclobenzaprine (FLEXERIL) 5 MG tablet Take 1 tablet (5 mg total) by mouth 2 (two) times daily as needed for muscle spasms. Patient not taking: Reported on 08/17/2021 07/08/21   Teodora Medici, FNP  gabapentin (NEURONTIN) 300 MG capsule Take 2 capsules (600 mg total) by mouth 3 (three) times daily. 01/09/22 04/09/22  Mcarthur Rossetti, MD  HYDROcodone-acetaminophen (NORCO) 7.5-325 MG tablet Take 1 tablet by mouth every 12 (twelve) hours as needed for moderate pain. Patient not taking: Reported on 08/17/2021 06/11/21   Jessy Oto, MD  HYDROcodone-acetaminophen (NORCO) 7.5-325 MG tablet  Take 1 tablet by mouth every 6 (six) hours as needed for moderate pain. 01/09/22   Mcarthur Rossetti, MD  naproxen (NAPROSYN) 375 MG tablet Take 1 tablet (375 mg total) by mouth 2 (two) times daily. 08/17/21   Kommor, Madison, MD  oxyCODONE (ROXICODONE) 5 MG immediate release tablet Take 1 tablet (5 mg total) by mouth every 4 (four) hours as needed for severe pain. 08/17/21   Kommor, Debe Coder, MD    Family History Family History  Problem Relation Age of Onset   Cancer Mother    Healthy Father     Social History Social History   Tobacco Use   Smoking status: Former    Packs/day: 0.05    Types: Cigarettes   Smokeless tobacco: Current  Vaping Use   Vaping Use: Never used  Substance Use Topics   Alcohol use: Yes    Comment: Socially   Drug use: Not Currently    Types: Marijuana     Allergies   Patient has no known allergies.   Review of Systems Review of Systems Per HPI  Physical Exam Triage Vital Signs ED Triage Vitals [02/17/22 1237]  Enc Vitals Group     BP (!) 138/93     Pulse Rate 80     Resp 16     Temp 98 F (36.7 C)     Temp Source Oral     SpO2 96 %  Weight      Height      Head Circumference      Peak Flow      Pain Score 0     Pain Loc      Pain Edu?      Excl. in GC?    No data found.  Updated Vital Signs BP (!) 138/93 (BP Location: Left Arm)   Pulse 80   Temp 98 F (36.7 C) (Oral)   Resp 16   SpO2 96%   Visual Acuity Right Eye Distance:   Left Eye Distance:   Bilateral Distance:    Right Eye Near:   Left Eye Near:    Bilateral Near:     Physical Exam Constitutional:      General: He is not in acute distress.    Appearance: Normal appearance. He is not toxic-appearing or diaphoretic.  HENT:     Head: Normocephalic and atraumatic.  Eyes:     Extraocular Movements: Extraocular movements intact.     Conjunctiva/sclera: Conjunctivae normal.  Pulmonary:     Effort: Pulmonary effort is normal.  Abdominal:       Comments:  Patient has approximately 1 cm linear laceration with loose approximation present to right upper abdomen.  Bleeding is controlled.  Unable to assess depth.  Neurological:     General: No focal deficit present.     Mental Status: He is alert and oriented to person, place, and time. Mental status is at baseline.  Psychiatric:        Mood and Affect: Mood normal.        Behavior: Behavior normal.        Thought Content: Thought content normal.        Judgment: Judgment normal.      UC Treatments / Results  Labs (all labs ordered are listed, but only abnormal results are displayed) Labs Reviewed - No data to display  EKG   Radiology No results found.  Procedures Procedures (including critical care time)  Medications Ordered in UC Medications - No data to display  Initial Impression / Assessment and Plan / UC Course  I have reviewed the triage vital signs and the nursing notes.  Pertinent labs & imaging results that were available during my care of the patient were reviewed by me and considered in my medical decision making (see chart for details).     Patient has pocket knife with him with blood still on it that shows the depth of laceration.  It appears that it did go in approximately 1 to 1.5 inches which is concerning that patient may need more advanced imaging of the abdomen before closure of the laceration.  Do not have these capabilities here in urgent care.  Patient was advised to go to the ER for further evaluation and management and was agreeable with plan.  Vital signs and patient stable at discharge.  Nonadherent dressing applied prior to discharge.  Tetanus vaccine is up-to-date.  Agree with patient self transport to the hospital. Final Clinical Impressions(s) / UC Diagnoses   Final diagnoses:  Laceration of abdominal wall, initial encounter     Discharge Instructions      Go to the emergency department as soon as you leave urgent care for further evaluation  and management.    ED Prescriptions   None    PDMP not reviewed this encounter.   Gustavus Bryant, [place] 02/17/22 [phone]

## 2022-02-17 NOTE — Discharge Instructions (Addendum)
You were evaluated today for a puncture wound to the abdomen. This was cleaned and sutured with 2 sutures. These should be removed in 7-10 days at any healthcare facility. If you develop severe abdominal pain, shortness of breath, or other life threatening symptoms, please return to the emergency department for reevaluation.

## 2022-02-17 NOTE — ED Notes (Signed)
ED Provider at bedside. 

## 2022-03-02 ENCOUNTER — Ambulatory Visit (INDEPENDENT_AMBULATORY_CARE_PROVIDER_SITE_OTHER): Payer: Commercial Managed Care - HMO

## 2022-03-02 ENCOUNTER — Ambulatory Visit: Payer: Commercial Managed Care - HMO

## 2022-03-02 ENCOUNTER — Ambulatory Visit
Admission: EM | Admit: 2022-03-02 | Discharge: 2022-03-02 | Disposition: A | Discharge status: Home / Self Care | Payer: Commercial Managed Care - HMO | Attending: Internal Medicine | Admitting: Internal Medicine

## 2022-03-02 ENCOUNTER — Encounter: Payer: Self-pay | Admitting: Emergency Medicine

## 2022-03-02 DIAGNOSIS — M79672 Pain in left foot: Secondary | ICD-10-CM

## 2022-03-02 DIAGNOSIS — M79671 Pain in right foot: Secondary | ICD-10-CM

## 2022-03-02 DIAGNOSIS — W19XXXA Unspecified fall, initial encounter: Secondary | ICD-10-CM

## 2022-03-02 DIAGNOSIS — M25571 Pain in right ankle and joints of right foot: Secondary | ICD-10-CM | POA: Diagnosis not present

## 2022-03-02 NOTE — Discharge Instructions (Addendum)
There is an abnormality on your ankle that is concerning for possible fracture.  Boot and crutches have been supplied.  Elevate extremity and apply ice.  Follow-up with orthopedist to schedule an appointment for further evaluation and management.

## 2022-03-02 NOTE — ED Triage Notes (Signed)
Patient states that he rolled his right ankle going downstairs x 2 nights ago.  Having problem applying pressure to ankle.  Patient has taken [place] for pain.

## 2022-03-02 NOTE — ED Provider Notes (Addendum)
[name]    CSN: [id number] Arrival date & time: 03/02/22  1057      History   Chief Complaint Chief Complaint  Patient presents with   Ankle Pain    HPI Carlito Bogert is a 32 y.o. male.   Patient presents for further evaluation of right ankle pain and left foot pain after fall that occurred approximately 2 days ago.  Patient reports that he fell down 2 steps and landed on his face.  He denies any facial pain, headache, dizziness, blurred vision, nausea, vomiting.  He reports he is only having pain in the right ankle as he rolled his ankle going down as well as in his left foot.  He is able to bear weight.  Denies any numbness or tingling.  Has taken Goody powders with minimal improvement in pain.   Ankle Pain   Past Medical History:  Diagnosis Date   Back pain     Patient Active Problem List   Diagnosis Date Noted   Protrusion of lumbar intervertebral disc 06/03/2020   Radiculopathy, lumbar region 06/03/2020   Right knee pain 07/05/2012   Nicotine addiction 12/01/2011   GERD 09/17/2008   OTHER SEBORRHEIC DERMATITIS 09/17/2008   HYPERHIDROSIS 09/17/2008    Past Surgical History:  Procedure Laterality Date   VENTRAL HERNIA REPAIR N/A 08/07/2018   Procedure: HERNIA REPAIR umblical;  Surgeon: Clovis Riley, MD;  Location: [facility];  Service: General;  Laterality: N/A;       Home Medications    Prior to Admission medications   Medication Sig Start Date End Date Taking? Authorizing Provider  gabapentin (NEURONTIN) 300 MG capsule Take 2 capsules (600 mg total) by mouth 3 (three) times daily. 01/09/22 04/09/22 Yes Mcarthur Rossetti, MD  cyclobenzaprine (FLEXERIL) 5 MG tablet Take 1 tablet (5 mg total) by mouth 2 (two) times daily as needed for muscle spasms. Patient not taking: Reported on 08/17/2021 07/08/21   Teodora Medici, FNP  HYDROcodone-acetaminophen (NORCO) 7.5-325 MG tablet Take 1 tablet by mouth every 12 (twelve) hours as needed for moderate  pain. Patient not taking: Reported on 08/17/2021 06/11/21   Jessy Oto, MD  HYDROcodone-acetaminophen (NORCO) 7.5-325 MG tablet Take 1 tablet by mouth every 6 (six) hours as needed for moderate pain. 01/09/22   Mcarthur Rossetti, MD  naproxen (NAPROSYN) 375 MG tablet Take 1 tablet (375 mg total) by mouth 2 (two) times daily. 08/17/21   Kommor, Madison, MD  oxyCODONE (ROXICODONE) 5 MG immediate release tablet Take 1 tablet (5 mg total) by mouth every 4 (four) hours as needed for severe pain. 08/17/21   Kommor, Debe Coder, MD    Family History Family History  Problem Relation Age of Onset   Cancer Mother    Healthy Father     Social History Social History   Tobacco Use   Smoking status: Former    Packs/day: 0.05    Types: Cigarettes   Smokeless tobacco: Current  Vaping Use   Vaping Use: Never used  Substance Use Topics   Alcohol use: Yes    Comment: Socially   Drug use: Not Currently    Types: Marijuana     Allergies   Patient has no known allergies.   Review of Systems Review of Systems Per HPI  Physical Exam Triage Vital Signs ED Triage Vitals  Enc Vitals Group     BP 03/02/22 1158 (!) 150/105     Pulse Rate 03/02/22 1158 67     Resp  03/02/22 1158 18     Temp 03/02/22 1158 98.2 F (36.8 C)     Temp Source 03/02/22 1158 Oral     SpO2 03/02/22 1158 98 %     Weight 03/02/22 1200 250 lb (113.4 kg)     Height 03/02/22 1200 6' (1.829 m)     Head Circumference --      Peak Flow --      Pain Score 03/02/22 1200 6     Pain Loc --      Pain Edu? --      Excl. in GC? --    No data found.  Updated Vital Signs BP (!) 147/105 (BP Location: Left Arm)   Pulse 74   Temp 98.2 F (36.8 C) (Oral)   Resp 18   Ht 6' (1.829 m)   Wt 250 lb (113.4 kg)   SpO2 98%   BMI 33.91 kg/m   Visual Acuity Right Eye Distance:   Left Eye Distance:   Bilateral Distance:    Right Eye Near:   Left Eye Near:    Bilateral Near:     Physical Exam Constitutional:      General:  He is not in acute distress.    Appearance: Normal appearance. He is not toxic-appearing or diaphoretic.  HENT:     Head: Normocephalic and atraumatic.  Eyes:     Extraocular Movements: Extraocular movements intact.     Conjunctiva/sclera: Conjunctivae normal.     Comments: No redness, abrasions, lacerations, discoloration, swelling present to face/head.  Cardiovascular:     Rate and Rhythm: Normal rate and regular rhythm.     Pulses: Normal pulses.     Heart sounds: Normal heart sounds.  Pulmonary:     Effort: Pulmonary effort is normal. No respiratory distress.     Breath sounds: Normal breath sounds. No wheezing.  Musculoskeletal:     Comments: Tenderness to palpation to medial and lateral right ankle with associated mild swelling to lateral malleolus of right ankle.  No discoloration, lacerations, abrasions noted.  No tenderness to foot or toes of right lower extremity.  Patient is neurovascularly intact.  Patient has tenderness to palpation to dorsal surface of left foot at first metatarsal and tenderness to plantar portion of foot directly adjacent to this area.  There is no discoloration, lacerations, abrasions, swelling noted.  Patient can wiggle toes.  No tenderness to ankle.  Neurovascularly intact.  Neurological:     General: No focal deficit present.     Mental Status: He is alert and oriented to person, place, and time. Mental status is at baseline.     Cranial Nerves: Cranial nerves 2-12 are intact.     Sensory: Sensation is intact.     Motor: Motor function is intact.     Coordination: Coordination is intact.     Gait: Gait is intact.  Psychiatric:        Mood and Affect: Mood normal.        Behavior: Behavior normal.        Thought Content: Thought content normal.        Judgment: Judgment normal.      UC Treatments / Results  Labs (all labs ordered are listed, but only abnormal results are displayed) Labs Reviewed - No data to display  EKG   Radiology DG  Foot Complete Left  Result Date: 03/02/2022 CLINICAL DATA:  [place] EXAM: LEFT FOOT - COMPLETE 3+ VIEW COMPARISON:  11/18/2014 FINDINGS: There is no evidence of  fracture or dislocation. There is no evidence of arthropathy or other focal bone abnormality. Soft tissues are unremarkable. IMPRESSION: Negative. Electronically Signed   By: Lucrezia Europe M.D.   On: 03/02/2022 13:55   DG Foot Complete Right  Result Date: 03/02/2022 CLINICAL DATA:  Fall.  Right foot pain EXAM: RIGHT FOOT COMPLETE - 3+ VIEW COMPARISON:  None Available. FINDINGS: There is no evidence of fracture or dislocation. There is no evidence of arthropathy or other focal bone abnormality. Soft tissues are unremarkable. IMPRESSION: Negative. Electronically Signed   By: Davina Poke D.O.   On: 03/02/2022 13:35   DG Foot Complete Left  Result Date: 03/02/2022 CLINICAL DATA:  Fall, right ankle pain EXAM: RIGHT ANKLE - COMPLETE 3+ VIEW COMPARISON:  None Available. FINDINGS: Small osseous density along the inferior tip of the medial malleolus, suggesting sequela of age indeterminate trauma. Elsewhere, there is no evidence of acute fracture. No malalignment. Os trigonum. Joint spaces are preserved. There is soft tissue swelling over the lateral malleolus. IMPRESSION: 1. Small osseous density along the inferior tip of the medial malleolus, suggesting sequela of age indeterminate trauma. Correlate with point tenderness. 2. Otherwise, no acute findings. 3. Soft tissue swelling over the lateral malleolus. Electronically Signed   By: Davina Poke D.O.   On: 03/02/2022 13:02   DG Ankle Complete Right  Result Date: 03/02/2022 CLINICAL DATA:  Fall, right ankle pain EXAM: RIGHT ANKLE - COMPLETE 3+ VIEW COMPARISON:  None Available. FINDINGS: Small osseous density along the inferior tip of the medial malleolus, suggesting sequela of age indeterminate trauma. Elsewhere, there is no evidence of acute fracture. No malalignment. Os trigonum. Joint spaces are  preserved. There is soft tissue swelling over the lateral malleolus. IMPRESSION: 1. Small osseous density along the inferior tip of the medial malleolus, suggesting sequela of age indeterminate trauma. Correlate with point tenderness. 2. Otherwise, no acute findings. 3. Soft tissue swelling over the lateral malleolus. Electronically Signed   By: Davina Poke D.O.   On: 03/02/2022 12:43    Procedures Procedures (including critical care time)  Medications Ordered in UC Medications - No data to display  Initial Impression / Assessment and Plan / UC Course  I have reviewed the triage vital signs and the nursing notes.  Pertinent labs & imaging results that were available during my care of the patient were reviewed by me and considered in my medical decision making (see chart for details).     Originally advised patient to go to the ER given facial injury.  Patient declined going to the ER.  Risks associated with not going to the ER were discussed with patient.  Patient voiced understanding and accepted risks.  Given no associated headache, dizziness, nausea, vomiting, blurred vision, I do think it is reasonable to do limited evaluation here in urgent care.  Right ankle x-ray showing possible abnormality but is age-indeterminate.  Patient reports that he has injured the ankle in the past.  Therefore, unsure if this is acute but given recent injury will treat acutely.  Cam boot and crutches supplied for patient.  Advised patient to follow-up with orthopedist at provided contact information for further evaluation and management.  Advised supportive care including elevation of extremity, ice application, safe over-the-counter pain relievers.  Other x-rays were negative for any acute bony abnormality.  Patient's blood pressure is mildly elevated but this is most likely due to pain.  Patient is asymptomatic regarding blood pressure so no further workup for this is necessary at this  time.  Patient to monitor  blood pressure at home.  Patient's right foot x-ray was completed originally as a left orientation by x-ray tech.  The reading of the x-ray of the original right foot x-ray has the ankle results included in it.  Radiology called by x-ray tech to fix this.  All the other x-rays were normal and in correct orientation. Left foot x-ray is normal.  Final Clinical Impressions(s) / UC Diagnoses   Final diagnoses:  Fall, initial encounter  Acute right ankle pain  Left foot pain     Discharge Instructions      There is an abnormality on your ankle that is concerning for possible fracture.  Boot and crutches have been supplied.  Elevate extremity and apply ice.  Follow-up with orthopedist to schedule an appointment for further evaluation and management.     ED Prescriptions   None    PDMP not reviewed this encounter.   Gustavus Bryant, [place] 03/02/22 1532    Gustavus Bryant, [place] 03/02/22 [phone]

## 2022-03-03 ENCOUNTER — Ambulatory Visit (INDEPENDENT_AMBULATORY_CARE_PROVIDER_SITE_OTHER): Payer: Commercial Managed Care - HMO | Admitting: Physician Assistant

## 2022-03-03 ENCOUNTER — Encounter: Payer: Self-pay | Admitting: Physician Assistant

## 2022-03-03 DIAGNOSIS — S93401A Sprain of unspecified ligament of right ankle, initial encounter: Secondary | ICD-10-CM | POA: Insufficient documentation

## 2022-03-03 HISTORY — DX: Sprain of unspecified ligament of right ankle, initial encounter: S93.401A

## 2022-03-03 NOTE — Progress Notes (Signed)
Office Visit Note   Patient: Joseph Vazquez           Date of Birth: [birth year]           MRN: [id number] Visit Date: 03/03/2022              Requested by: No referring provider defined for this encounter. PCP: Pcp, No   Assessment & Plan: Visit Diagnoses:  1. Sprain of unspecified ligament of right ankle, initial encounter     Plan: Joseph Vazquez is a pleasant 33 year old gentleman who is 3 days status post inversion injury to his right ankle when he fell down some stairs.  He was seen at an urgent care x-rays were taken showed ossification of the medial malleolus but otherwise well-maintained alignment.  Fragment is of undetermined age and after reviewing it I think this is probably old.  He does have a history of recurrent ankle sprains more on the right than the left.  He does have increased translation with anterior draw.  Negative squeeze sign no tenderness over the proximal fibula.  He has no bruising.  No tenderness in the foot.  Mostly tender over the lateral and medial ankle joint.  Discussed with him that I will continue to immobilize him in a boot but he may bear weight as tolerated for the next 2 weeks.  Will follow-up at that time and reevaluate.  Will go into an ankle brace and begin some rehab.  Ultimately if he cannot regain stability given his history of multiple sprains would recommend an MRI  Follow-Up Instructions: Return in about 2 weeks (around 03/17/2022).   Orders:  No orders of the defined types were placed in this encounter.  No orders of the defined types were placed in this encounter.     Procedures: No procedures performed   Clinical Data: No additional findings.   Subjective: Chief Complaint  Patient presents with   Right Ankle - Follow-up    HPI 32 year old gentleman with 3-day history of right lateral medial ankle pain.  Sustained an inversion injury when he fell down a couple steps.  Denies any real foot pain.  Denies any significant ecchymosis just  some swelling.  Has a previous history of multiple right ankle sprains  Review of Systems  All other systems reviewed and are negative.    Objective: Vital Signs: There were no vitals taken for this visit.  Physical Exam Constitutional:      Appearance: Normal appearance.  Pulmonary:     Effort: Pulmonary effort is normal.  Skin:    General: Skin is warm and dry.  Neurological:     Mental Status: He is alert.     Ortho Exam Right ankle mild soft tissue swelling but no ecchymosis.  He has slight varus hindfoot alignment.  Compartments are soft and nontender no redness.  Negative squeeze test.  No tenderness over the proximal fibula.  She has good inversion eversion dorsiflexion and plantarflexion no plantarflexion does cause him to have some pain as well as dorsiflexion.  He has increased translation with anterior draw compared to his left ankle.  Pulses are intact.  Tender over the distal fibula and lateral ligaments.  Also tender over the medial malleolus and deltoid Specialty Comments:  No specialty comments available.  Imaging: DG Foot Complete Left  Result Date: 03/02/2022 CLINICAL DATA:  [place] EXAM: LEFT FOOT - COMPLETE 3+ VIEW COMPARISON:  11/18/2014 FINDINGS: There is no evidence of fracture or dislocation. There is no evidence of arthropathy  or other focal bone abnormality. Soft tissues are unremarkable. IMPRESSION: Negative. Electronically Signed   By: Lucrezia Europe M.D.   On: 03/02/2022 13:55   DG Foot Complete Right  Result Date: 03/02/2022 CLINICAL DATA:  Fall.  Right foot pain EXAM: RIGHT FOOT COMPLETE - 3+ VIEW COMPARISON:  None Available. FINDINGS: There is no evidence of fracture or dislocation. There is no evidence of arthropathy or other focal bone abnormality. Soft tissues are unremarkable. IMPRESSION: Negative. Electronically Signed   By: Davina Poke D.O.   On: 03/02/2022 13:35   DG Foot Complete Left  Result Date: 03/02/2022 CLINICAL DATA:  Fall, right  ankle pain EXAM: RIGHT ANKLE - COMPLETE 3+ VIEW COMPARISON:  None Available. FINDINGS: Small osseous density along the inferior tip of the medial malleolus, suggesting sequela of age indeterminate trauma. Elsewhere, there is no evidence of acute fracture. No malalignment. Os trigonum. Joint spaces are preserved. There is soft tissue swelling over the lateral malleolus. IMPRESSION: 1. Small osseous density along the inferior tip of the medial malleolus, suggesting sequela of age indeterminate trauma. Correlate with point tenderness. 2. Otherwise, no acute findings. 3. Soft tissue swelling over the lateral malleolus. Electronically Signed   By: Davina Poke D.O.   On: 03/02/2022 13:02   DG Ankle Complete Right  Result Date: 03/02/2022 CLINICAL DATA:  Fall, right ankle pain EXAM: RIGHT ANKLE - COMPLETE 3+ VIEW COMPARISON:  None Available. FINDINGS: Small osseous density along the inferior tip of the medial malleolus, suggesting sequela of age indeterminate trauma. Elsewhere, there is no evidence of acute fracture. No malalignment. Os trigonum. Joint spaces are preserved. There is soft tissue swelling over the lateral malleolus. IMPRESSION: 1. Small osseous density along the inferior tip of the medial malleolus, suggesting sequela of age indeterminate trauma. Correlate with point tenderness. 2. Otherwise, no acute findings. 3. Soft tissue swelling over the lateral malleolus. Electronically Signed   By: Davina Poke D.O.   On: 03/02/2022 12:43     PMFS History: Patient Active Problem List   Diagnosis Date Noted   Sprain of unspecified ligament of right ankle, initial encounter 03/03/2022   Protrusion of lumbar intervertebral disc 06/03/2020   Radiculopathy, lumbar region 06/03/2020   Right knee pain 07/05/2012   Nicotine addiction 12/01/2011   GERD 09/17/2008   OTHER SEBORRHEIC DERMATITIS 09/17/2008   HYPERHIDROSIS 09/17/2008   Past Medical History:  Diagnosis Date   Back pain     Family  History  Problem Relation Age of Onset   Cancer Mother    Healthy Father     Past Surgical History:  Procedure Laterality Date   VENTRAL HERNIA REPAIR N/A 08/07/2018   Procedure: HERNIA REPAIR umblical;  Surgeon: Clovis Riley, MD;  Location: [facility];  Service: General;  Laterality: N/A;   Social History   Occupational History   Not on file  Tobacco Use   Smoking status: Former    Packs/day: 0.05    Types: Cigarettes   Smokeless tobacco: Current  Vaping Use   Vaping Use: Never used  Substance and Sexual Activity   Alcohol use: Yes    Comment: Socially   Drug use: Not Currently    Types: Marijuana   Sexual activity: Yes

## 2022-03-12 ENCOUNTER — Other Ambulatory Visit: Payer: Self-pay | Admitting: Orthopaedic Surgery

## 2022-03-13 ENCOUNTER — Encounter: Payer: Self-pay | Admitting: Orthopaedic Surgery

## 2022-03-13 ENCOUNTER — Ambulatory Visit (INDEPENDENT_AMBULATORY_CARE_PROVIDER_SITE_OTHER): Payer: Commercial Managed Care - HMO | Admitting: Orthopaedic Surgery

## 2022-03-13 VITALS — BP 137/93 | HR 79 | Ht 72.0 in | Wt 250.0 lb

## 2022-03-13 DIAGNOSIS — M47816 Spondylosis without myelopathy or radiculopathy, lumbar region: Secondary | ICD-10-CM | POA: Insufficient documentation

## 2022-03-13 DIAGNOSIS — M5126 Other intervertebral disc displacement, lumbar region: Secondary | ICD-10-CM

## 2022-03-13 MED ORDER — GABAPENTIN 300 MG PO CAPS: 600.0000 mg | ORAL_CAPSULE | Freq: Three times a day (TID) | ORAL | 0 refills | Status: DC

## 2022-03-13 NOTE — Progress Notes (Signed)
Office Visit Note   Patient: Joseph Vazquez           Date of Birth: August 14, 1990           MRN: [id number] Visit Date: 03/13/2022              Requested by: No referring provider defined for this encounter. PCP: Pcp, No   Assessment & Plan: Visit Diagnoses:  1. Protrusion of lumbar intervertebral disc   2. Facet degeneration of lumbar region          Plan: Gabapentin renewed we reviewed his plain radiographs previous CT scan also previous MRI scan.  He will try to work on losing the weight work on a walking program ,core strengthening.  Follow-Up Instructions: No follow-ups on file.   Orders:  No orders of the defined types were placed in this encounter.  Meds ordered this encounter  Medications   gabapentin (NEURONTIN) 300 MG capsule    Sig: Take 2 capsules (600 mg total) by mouth 3 (three) times daily.    Dispense:  540 capsule    Refill:  0      Procedures: No procedures performed   Clinical Data: No additional findings.   Subjective: Chief Complaint  Patient presents with   Lower Back - Pain    HPI 32 year old male long-term patient of Dr. Louanne Skye who previously was on some Norco 7.5 chronically has had previous MRI scan last year which showed some disc protrusion L4-5 L5-S1 with some short pedicles and multilevel disc desiccation.  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  He has not had any surgery and currently has been taking Neurontin medication daily which helps.  Patient has some Shermans disease.  Last MRI showed some partial decreased in the lumbar disc protrusion.  Review of Systems 14 point system noncontributory HPI.   Objective: Vital Signs: BP (!) 137/93   Pulse 79   Ht 6' (1.829 m)   Wt 250 lb (113.4 kg)   BMI 33.91 kg/m   Physical Exam Constitutional:      Appearance: He is well-developed.  HENT:     Head: Normocephalic and atraumatic.     Right  Ear: External ear normal.     Left Ear: External ear normal.  Eyes:     Pupils: Pupils are equal, round, and reactive to light.  Neck:     Thyroid: No thyromegaly.     Trachea: No tracheal deviation.  Cardiovascular:     Rate and Rhythm: Normal rate.  Pulmonary:     Effort: Pulmonary effort is normal.     Breath sounds: No wheezing.  Abdominal:     General: Bowel sounds are normal.     Palpations: Abdomen is soft.  Musculoskeletal:     Cervical back: Neck supple.  Skin:    General: Skin is warm and dry.     Capillary Refill: Capillary refill takes less than 2 seconds.  Neurological:     Mental Status: He is alert and oriented to person, place, and time.  Psychiatric:        Behavior: Behavior normal.        Thought Content: Thought content normal.        Judgment: Judgment normal.     Ortho Exam negative straight leg raising 90 degrees patient is able to ambulate anterior tib gastrocsoleus is intact no rash over exposed skin.  Specialty Comments:  No specialty comments available.  Imaging: No results found.   PMFS History: Patient Active Problem List   Diagnosis Date Noted   Facet degeneration of lumbar region 03/13/2022   Sprain of unspecified ligament of right ankle, initial encounter 03/03/2022   Protrusion of lumbar intervertebral disc 06/03/2020   Radiculopathy, lumbar region 06/03/2020   Right knee pain 07/05/2012   Nicotine addiction 12/01/2011   GERD 09/17/2008   OTHER SEBORRHEIC DERMATITIS 09/17/2008   HYPERHIDROSIS 09/17/2008   Past Medical History:  Diagnosis Date   Back pain     Family History  Problem Relation Age of Onset   Cancer Mother    Healthy Father     Past Surgical History:  Procedure Laterality Date   VENTRAL HERNIA REPAIR N/A 08/07/2018   Procedure: HERNIA REPAIR umblical;  Surgeon: Clovis Riley, MD;  Location: [facility];  Service: General;  Laterality: N/A;   Social History   Occupational History   Not on file  Tobacco Use    Smoking status: Former    Packs/day: 0.05    Types: Cigarettes   Smokeless tobacco: Current  Vaping Use   Vaping Use: Never used  Substance and Sexual Activity   Alcohol use: Yes    Comment: Socially   Drug use: Not Currently    Types: Marijuana   Sexual activity: Yes

## 2022-03-15 ENCOUNTER — Other Ambulatory Visit: Payer: Self-pay | Admitting: Orthopaedic Surgery

## 2022-03-16 ENCOUNTER — Telehealth: Payer: Self-pay | Admitting: Orthopaedic Surgery

## 2022-03-16 NOTE — Telephone Encounter (Signed)
I spoke with patient and advised, per Dr. Lorin Mercy, Gabapentin was refilled as originally written. We had received request from pharmacy that patient requested more as he needed to take additional medication some days. Per Dr. Lorin Mercy, fill as ordered, no additional added. Message was sent back to pharmacy. I explained to patient. He will call pharmacy to see if they have medication ready to pick up.

## 2022-03-16 NOTE — Telephone Encounter (Signed)
Patient states his gabapentin was called in but pharmacy need to speak to Dr. Lorin Mercy first. Please advise patient.Marland Kitchen

## 2022-04-15 ENCOUNTER — Encounter: Payer: Self-pay | Admitting: Orthopaedic Surgery

## 2022-04-15 ENCOUNTER — Other Ambulatory Visit: Payer: Self-pay | Admitting: Orthopaedic Surgery

## 2022-04-15 MED ORDER — HYDROCODONE-ACETAMINOPHEN 5-325 MG PO TABS: 1.0000 | ORAL_TABLET | Freq: Two times a day (BID) | ORAL | 0 refills | Status: DC | PRN

## 2022-05-18 ENCOUNTER — Other Ambulatory Visit: Payer: Self-pay | Admitting: Orthopaedic Surgery

## 2022-06-23 ENCOUNTER — Encounter: Payer: Self-pay | Admitting: Orthopaedic Surgery

## 2022-06-23 DIAGNOSIS — M47816 Spondylosis without myelopathy or radiculopathy, lumbar region: Secondary | ICD-10-CM

## 2022-06-23 DIAGNOSIS — M5126 Other intervertebral disc displacement, lumbar region: Secondary | ICD-10-CM

## 2022-07-01 ENCOUNTER — Telehealth: Payer: Self-pay | Admitting: Orthopaedic Surgery

## 2022-07-01 DIAGNOSIS — M5126 Other intervertebral disc displacement, lumbar region: Secondary | ICD-10-CM

## 2022-07-01 NOTE — Telephone Encounter (Signed)
MRI order entered. I left voicemail for patient advising. Explained we would have to get insurance approval and someone would call to schedule.

## 2022-07-01 NOTE — Telephone Encounter (Signed)
Please advise. Patient requested MRI previously and you referred to Dr. Christell Constant.  That appointment is scheduled for 07/09/2022.  Patient feels he needs MRI done now.

## 2022-07-01 NOTE — Telephone Encounter (Signed)
Pt called requesting an MRI. He states he has been coming here for years and feel like he just need an MRI on his back. Please call pt about thus matter at [phone].

## 2022-07-07 ENCOUNTER — Encounter: Payer: Self-pay | Admitting: Orthopaedic Surgery

## 2022-07-09 ENCOUNTER — Ambulatory Visit: Payer: Commercial Managed Care - HMO | Admitting: Orthopedic Surgery

## 2022-07-23 ENCOUNTER — Encounter (HOSPITAL_BASED_OUTPATIENT_CLINIC_OR_DEPARTMENT_OTHER): Payer: Self-pay | Admitting: Emergency Medicine

## 2022-07-23 ENCOUNTER — Emergency Department (HOSPITAL_BASED_OUTPATIENT_CLINIC_OR_DEPARTMENT_OTHER)
Admission: EM | Admit: 2022-07-23 | Discharge: 2022-07-23 | Disposition: A | Discharge status: Home / Self Care | Payer: BC Managed Care – PPO | Attending: Emergency Medicine | Admitting: Emergency Medicine

## 2022-07-23 ENCOUNTER — Emergency Department (HOSPITAL_BASED_OUTPATIENT_CLINIC_OR_DEPARTMENT_OTHER): Payer: BC Managed Care – PPO | Admitting: Radiology

## 2022-07-23 DIAGNOSIS — Y92410 Unspecified street and highway as the place of occurrence of the external cause: Secondary | ICD-10-CM | POA: Diagnosis not present

## 2022-07-23 DIAGNOSIS — R0781 Pleurodynia: Secondary | ICD-10-CM | POA: Insufficient documentation

## 2022-07-23 MED ORDER — HYDROCODONE-ACETAMINOPHEN 5-325 MG PO TABS
1.0000 | ORAL_TABLET | Freq: Once | ORAL | Status: AC
  Administered 2022-07-23: 1 via ORAL
  Filled 2022-07-23: qty 1

## 2022-07-23 NOTE — Discharge Instructions (Signed)
As we discussed, your workup in the ER today was reassuring for acute findings.  X-ray imaging of your chest did not show any rib fractures.  This does not definitively rule out a rib fracture as a small nondisplaced rib fracture could be missed on an x-ray would need a CT scan to definitively rule this out.  However management of rib fractures is supportive with rest and ice and Tylenol/ibuprofen as needed for pain and therefore there is no indication to get a CT scan today given you are young and otherwise healthy.  It is very important that you continue to take a deep breath despite your discomfort to ensure you do not develop pneumonia. Please call your pcp for close follow-up as needed.  Return if development of any new or worsening symptoms.
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
## 2022-07-24 ENCOUNTER — Other Ambulatory Visit: Payer: Self-pay | Admitting: Orthopaedic Surgery

## 2022-07-24 NOTE — Telephone Encounter (Signed)
The patient needs to be seen in the office first.  I have not prescribed this medication in a long period of time.

## 2022-07-30 ENCOUNTER — Encounter: Payer: Self-pay | Admitting: Orthopaedic Surgery

## 2022-07-30 ENCOUNTER — Telehealth: Payer: Self-pay | Admitting: Orthopaedic Surgery

## 2022-07-30 NOTE — Telephone Encounter (Signed)
Pt called stating insurance is asking for more information to approve MRI. Please send necessary documents to insurance. Pt phone number is [phone].

## 2022-08-03 ENCOUNTER — Ambulatory Visit
Admission: RE | Admit: 2022-08-03 | Discharge: 2022-08-03 | Disposition: A | Discharge status: Home / Self Care | Payer: Commercial Managed Care - HMO | Source: Ambulatory Visit | Attending: Orthopaedic Surgery | Admitting: Orthopaedic Surgery

## 2022-08-03 DIAGNOSIS — M5126 Other intervertebral disc displacement, lumbar region: Secondary | ICD-10-CM

## 2022-08-03 NOTE — Telephone Encounter (Signed)
Pt is approved not sure where he got the letter stating he was denied.

## 2022-08-10 ENCOUNTER — Ambulatory Visit (INDEPENDENT_AMBULATORY_CARE_PROVIDER_SITE_OTHER): Payer: BC Managed Care – PPO | Admitting: Physician Assistant

## 2022-08-10 ENCOUNTER — Other Ambulatory Visit (INDEPENDENT_AMBULATORY_CARE_PROVIDER_SITE_OTHER): Payer: Commercial Managed Care - HMO

## 2022-08-10 ENCOUNTER — Encounter: Payer: Self-pay | Admitting: Physician Assistant

## 2022-08-10 DIAGNOSIS — G8929 Other chronic pain: Secondary | ICD-10-CM

## 2022-08-10 DIAGNOSIS — M25562 Pain in left knee: Secondary | ICD-10-CM | POA: Diagnosis not present

## 2022-08-10 DIAGNOSIS — M25561 Pain in right knee: Secondary | ICD-10-CM

## 2022-08-10 MED ORDER — MELOXICAM 15 MG PO TABS: 15.0000 mg | ORAL_TABLET | Freq: Every day | ORAL | 0 refills | Status: DC

## 2022-08-10 NOTE — Progress Notes (Signed)
Office Visit Note   Patient: Joseph Vazquez           Date of Birth: 1990/11/20           MRN: [id number] Visit Date: 08/10/2022              Requested by: Jennilyn Esteve, [place], [place] [address] [place],  [place] [address] PCP: Pcp, No  Chief Complaint  Patient presents with   Right Knee - Pain   Left Knee - Pain      HPI: Joseph Vazquez is a pleasant 32 year old gentleman with a 1 year history of anterior knee pain.  Denies any injuries.  Denies any swelling fever or chills.  He notices the pain just at the top of his kneecap and over the quadricep tendon.  He is treated this symptomatically with ice.  Denies any instability.  The pain sometimes wakes him at night.  He finds it is most difficult when his knees are bent.  Squatting is quite difficult for him and he says it up and down stairs also are bothersome.  Assessment & Plan: Visit Diagnoses:  1. Chronic pain of both knees     Plan: Findings most consistent with chondromalacia patella.  Talked about the natural history of this he has not really done much treatment.  We talked about close chain knee exercises of 5 given him to try.  Should try and do those for 5 days a week.  Also will place him on a 3-week course of meloxicam.  He is to take this daily with food and not any other anti-inflammatories.  Discussed topical products such as Voltaren gel.  I told him to allow about 6 weeks to see how well he does he is in agreement with plan  Follow-Up Instructions: No follow-ups on file.   Ortho Exam  Patient is alert, oriented, no adenopathy, well-dressed, normal affect, normal respiratory effort. Bilateral knees no effusion no erythema compartments are soft and compressible he is neurovascularly intact.  He does have some grinding with range of motion.  He is able to sustain a straight leg raise and resist flexion of his knee.  Quadriceps mechanism and patellar tendon mechanism are intact good varus valgus and anterior stability no tenderness  over the medial lateral joint lines  Imaging: XR KNEE 3 VIEW RIGHT  Result Date: 08/10/2022 3 views of the right knee were obtained today.  Well-maintained alignment no acute fractures he does have some narrowing of the patellofemoral joint  XR KNEE 3 VIEW LEFT  Result Date: 08/10/2022 3 views of his left knee were obtained today.  Well-maintained alignment he does have some narrowing of the patellofemoral joint no acute fractures  No images are attached to the encounter.  Labs: Lab Results  Component Value Date   REPTSTATUS 10/26/2018 FINAL 10/23/2018   GRAMSTAIN  10/23/2018    NO WBC SEEN MODERATE GRAM POSITIVE COCCI IN PAIRS MODERATE GRAM NEGATIVE RODS RARE GRAM POSITIVE RODS    CULT  10/23/2018    RARE NORMAL SKIN FLORA Performed at [facility], [address], [place], [place] [address]      Lab Results  Component Value Date   ALBUMIN 4.7 08/17/2021   ALBUMIN 4.1 08/07/2018    No results found for: "MG" No results found for: "VD25OH"  No results found for: "PREALBUMIN"    Latest Ref Rng & Units 02/17/2022    1:33 PM 08/17/2021    2:44 PM 08/07/2018   12:20 AM  CBC EXTENDED  WBC 4.0 - 10.5 K/uL 9.0  13.3  9.7   RBC 4.22 - 5.81 MIL/uL 4.57  4.59  4.05   Hemoglobin 13.0 - 17.0 g/dL 16.1  09.6  04.5   HCT 39.0 - 52.0 % 40.1  42.3  38.3   Platelets 150 - 400 K/uL 363  344  274   NEUT# 1.7 - 7.7 K/uL  10.2  5.4   Lymph# 0.7 - 4.0 K/uL  2.1  3.3      There is no height or weight on file to calculate BMI.  Orders:  Orders Placed This Encounter  Procedures   XR KNEE 3 VIEW LEFT   XR KNEE 3 VIEW RIGHT   No orders of the defined types were placed in this encounter.    Procedures: No procedures performed  Clinical Data: No additional findings.  ROS:  All other systems negative, except as noted in the HPI. Review of Systems  Objective: Vital Signs: There were no vitals taken for this visit.  Specialty Comments:  No specialty comments  available.  PMFS History: Patient Active Problem List   Diagnosis Date Noted   Facet degeneration of lumbar region 03/13/2022   Sprain of unspecified ligament of right ankle, initial encounter 03/03/2022   Protrusion of lumbar intervertebral disc 06/03/2020   Radiculopathy, lumbar region 06/03/2020   Right knee pain 07/05/2012   Nicotine addiction 12/01/2011   GERD 09/17/2008   OTHER SEBORRHEIC DERMATITIS 09/17/2008   HYPERHIDROSIS 09/17/2008   Past Medical History:  Diagnosis Date   Back pain     Family History  Problem Relation Age of Onset   Cancer Mother    Healthy Father     Past Surgical History:  Procedure Laterality Date   HERNIA REPAIR     VENTRAL HERNIA REPAIR N/A 08/07/2018   Procedure: HERNIA REPAIR umblical;  Surgeon: Berna Bue, MD;  Location: [facility];  Service: General;  Laterality: N/A;   Social History   Occupational History   Not on file  Tobacco Use   Smoking status: Former    Packs/day: .05    Types: Cigarettes   Smokeless tobacco: Current  Vaping Use   Vaping Use: Every day  Substance and Sexual Activity   Alcohol use: Yes    Comment: Socially   Drug use: Not Currently    Types: Marijuana   Sexual activity: Yes

## 2022-08-12 ENCOUNTER — Ambulatory Visit: Payer: Commercial Managed Care - HMO | Admitting: Orthopedic Surgery

## 2022-08-24 ENCOUNTER — Other Ambulatory Visit: Payer: Self-pay | Admitting: Orthopaedic Surgery

## 2022-09-07 ENCOUNTER — Other Ambulatory Visit: Payer: Self-pay | Admitting: Physician Assistant

## 2022-10-06 ENCOUNTER — Other Ambulatory Visit: Payer: Self-pay | Admitting: Physician Assistant

## 2022-10-28 ENCOUNTER — Other Ambulatory Visit: Payer: Self-pay | Admitting: Orthopaedic Surgery

## 2022-11-15 ENCOUNTER — Other Ambulatory Visit: Payer: Self-pay | Admitting: Physician Assistant

## 2022-12-03 ENCOUNTER — Other Ambulatory Visit (INDEPENDENT_AMBULATORY_CARE_PROVIDER_SITE_OTHER): Payer: Managed Care, Other (non HMO)

## 2022-12-03 ENCOUNTER — Ambulatory Visit: Payer: Managed Care, Other (non HMO) | Admitting: Physician Assistant

## 2022-12-03 DIAGNOSIS — M5416 Radiculopathy, lumbar region: Secondary | ICD-10-CM | POA: Diagnosis not present

## 2022-12-03 MED ORDER — METHYLPREDNISOLONE 4 MG PO TBPK: ORAL_TABLET | ORAL | 0 refills | Status: DC

## 2022-12-03 NOTE — Progress Notes (Signed)
Office Visit Note   Patient: Joseph Vazquez           Date of Birth: [birth year]           MRN: [id number] Visit Date: 12/03/2022              Requested by: No referring provider defined for this encounter. PCP: Pcp, No   Assessment & Plan: Visit Diagnoses:  1. Radiculopathy, lumbar region     Plan: Patient has had a 10-year history of low back pain.  He had previously seen Dr. Otelia Sergeant.  He has tried injections in the past.  Initially these were helpful for about a year and now he is only got relief for about 2 months.  He has spoken to Dr. Weston Brass about surgery in the past.  He has recently gotten second opinions at [facility] with regarding to nonoperative treatment including continuing injections versus a spinal stimulator.  He was discouraged from doing this.  I do really think he would like a surgical opinion.  I will refer him to Dr. Christell Constant.  Also he has recently felt a pop the last couple days and had increasing pain in his lower back with radicular symptoms shooting down his right leg.  He is strength is intact.  He has no loss of bowel or bladder control.  For now we will give him a steroid taper.  He understands not to take other anti-inflammatories with this but may take Tylenol will call me if he has any increasing symptoms prior to seeing Dr. Christell Constant  Follow-Up Instructions: No follow-ups on file.   Orders:  No orders of the defined types were placed in this encounter.  No orders of the defined types were placed in this encounter.     Procedures: No procedures performed   Clinical Data: No additional findings.   Subjective: No chief complaint on file.   HPI patient is a pleasant 32 year old gentleman former patient of Dr. Otelia Sergeant who has a long history of back pain.  This has been going on for about 10 years.  He thinks this may have been related to an injury when he was bowling.  He has paresthesias and radicular findings going down both legs.  He has gotten injections in his back  that did not seem to help him much.  He is also sought second opinions at [facility] with regards to continuing injections versus adding a stimulator but was told it would not help.  He woke up yesterday and something "shifted ".  Pain starts at the back to the thigh radiating down to his feet denies any loss of bowel or bladder control is seeking a surgical opinion.  Rates his pain is moderate  Review of Systems   Objective: Vital Signs: There were no vitals taken for this visit.  Physical Exam Constitutional:      Appearance: Normal appearance.  Pulmonary:     Effort: Pulmonary effort is normal.  Neurological:     General: No focal deficit present.     Mental Status: He is alert and oriented to person, place, and time.  Psychiatric:        Mood and Affect: Mood normal.        Behavior: Behavior normal.     Ortho Exam Examination of his low back he has no erythema no step-offs he does have some tenderness in the paravertebral muscles and over the lower back in the area of L4-5 L5-S1.  He has excellent strength with resisted  dorsiflexion plantarflexion extension and flexion of his knees.  No tenderness in the groin or with manipulation of his hip.  Positive straight leg raise on the right Specialty Comments:  No specialty comments available.  Imaging: No results found.   PMFS History: Patient Active Problem List   Diagnosis Date Noted   Facet degeneration of lumbar region 03/13/2022   Sprain of unspecified ligament of right ankle, initial encounter 03/03/2022   Protrusion of lumbar intervertebral disc 06/03/2020   Radiculopathy, lumbar region 06/03/2020   Right knee pain 07/05/2012   Nicotine addiction 12/01/2011   GERD 09/17/2008   OTHER SEBORRHEIC DERMATITIS 09/17/2008   HYPERHIDROSIS 09/17/2008   Past Medical History:  Diagnosis Date   Back pain     Family History  Problem Relation Age of Onset   Cancer Mother    Healthy Father     Past Surgical History:  Procedure  Laterality Date   HERNIA REPAIR     VENTRAL HERNIA REPAIR N/A 08/07/2018   Procedure: HERNIA REPAIR umblical;  Surgeon: Berna Bue, MD;  Location: [facility];  Service: General;  Laterality: N/A;   Social History   Occupational History   Not on file  Tobacco Use   Smoking status: Former    Current packs/day: 0.05    Types: Cigarettes   Smokeless tobacco: Current  Vaping Use   Vaping status: Every Day  Substance and Sexual Activity   Alcohol use: Yes    Comment: Socially   Drug use: Not Currently    Types: Marijuana   Sexual activity: Yes

## 2022-12-07 ENCOUNTER — Encounter: Payer: Self-pay | Admitting: Nurse Practitioner

## 2022-12-14 ENCOUNTER — Other Ambulatory Visit: Payer: Self-pay | Admitting: Nurse Practitioner

## 2022-12-14 DIAGNOSIS — E291 Testicular hypofunction: Secondary | ICD-10-CM

## 2022-12-16 ENCOUNTER — Telehealth: Payer: Self-pay | Admitting: Physician Assistant

## 2022-12-16 ENCOUNTER — Other Ambulatory Visit: Payer: Self-pay | Admitting: Specialist

## 2022-12-16 ENCOUNTER — Telehealth: Payer: Managed Care, Other (non HMO) | Admitting: Physician Assistant

## 2022-12-16 DIAGNOSIS — M5416 Radiculopathy, lumbar region: Secondary | ICD-10-CM

## 2022-12-16 NOTE — Telephone Encounter (Signed)
Called and spoke to patient and advised him that West Bali Sated he has to get pain medication form one of the other Dr's he has seen after seeing her   he said okay he understood

## 2022-12-16 NOTE — Progress Notes (Signed)
Because ongoing symptoms despite initial treatment and concerning symptoms such as altered sensation, I feel your condition warrants further evaluation and I recommend that you be seen in a face to face visit.   NOTE: There will be NO CHARGE for this eVisit   If you are having a true medical emergency please call 911.      For an urgent face to face visit, [facility] has eight urgent care centers for your convenience:   NEW!! [facility] Center at [facility] Get Driving Directions [phone] [address] [place], [address]    [facility] Health Urgent Care Center at [facility] Get Driving Directions [phone] [address] [place], [place] [address]   [facility] Health Urgent Care Center [facility]) Get Driving Directions [phone] [address] [place], [place] [address]  [facility] Health Urgent Care Center [facility]) Get Driving Directions [phone] [address] [place],  [place]  [address]  [facility] Health Urgent Care Center [facility] - at [employer]  [phone] 567 695 [address] [place],  [place] [address]   [facility] Health Urgent Care at [facility] Get Driving Directions [phone] [address] [place], [place] [address]   [facility] Health Urgent Care at [facility] Get Driving Directions  [phone] [address] [place], [place] [address]   [facility] Health Urgent Care at [facility] Directions [phone] [address] [place], [place] [address]  Your MyChart E-visit questionnaire answers were reviewed by a board certified advanced clinical practitioner to complete your personal care plan based on your specific symptoms.  Thank you for using e-Visits.

## 2022-12-16 NOTE — Telephone Encounter (Signed)
Patient called and said that the medication for his back pain didn't work so he needs something for his pain. [phone]

## 2022-12-29 ENCOUNTER — Encounter: Payer: Self-pay | Admitting: Nurse Practitioner

## 2022-12-30 ENCOUNTER — Inpatient Hospital Stay
Admission: RE | Admit: 2022-12-30 | Discharge: 2022-12-30 | Discharge status: Home / Self Care | Payer: Managed Care, Other (non HMO) | Source: Ambulatory Visit | Attending: Nurse Practitioner | Admitting: Nurse Practitioner

## 2022-12-30 ENCOUNTER — Ambulatory Visit: Payer: Managed Care, Other (non HMO) | Admitting: Orthopedic Surgery

## 2022-12-30 DIAGNOSIS — E291 Testicular hypofunction: Secondary | ICD-10-CM

## 2022-12-30 MED ORDER — GADOPICLENOL 0.5 MMOL/ML IV SOLN
10.0000 mL | Freq: Once | INTRAVENOUS | Status: AC | PRN
  Administered 2022-12-30: 10 mL via INTRAVENOUS

## 2023-01-12 ENCOUNTER — Other Ambulatory Visit: Payer: Self-pay

## 2023-01-12 ENCOUNTER — Encounter (HOSPITAL_COMMUNITY): Payer: Self-pay

## 2023-01-12 ENCOUNTER — Emergency Department (HOSPITAL_COMMUNITY): Payer: Commercial Managed Care - HMO

## 2023-01-12 ENCOUNTER — Emergency Department (HOSPITAL_COMMUNITY)
Admission: EM | Admit: 2023-01-12 | Discharge: 2023-01-12 | Disposition: A | Discharge status: Home / Self Care | Payer: Commercial Managed Care - HMO | Attending: Emergency Medicine | Admitting: Emergency Medicine

## 2023-01-12 DIAGNOSIS — R55 Syncope and collapse: Secondary | ICD-10-CM | POA: Diagnosis present

## 2023-01-12 LAB — BASIC METABOLIC PANEL
Anion gap: 9 (ref 5–15)
BUN: 13 mg/dL (ref 6–20)
CO2: 27 mmol/L (ref 22–32)
Calcium: 9.4 mg/dL (ref 8.9–10.3)
Chloride: 102 mmol/L (ref 98–111)
Creatinine, Ser: 0.99 mg/dL (ref 0.61–1.24)
GFR, Estimated: >60 mL/min (ref >60)
Glucose, Bld: 100 mg/dL — ABNORMAL HIGH (ref 70–99)
Potassium: 4.9 mmol/L (ref 3.5–5.1)
Sodium: 138 mmol/L (ref 135–145)

## 2023-01-12 LAB — CBC
HCT: 40.2 % (ref 39.0–52.0)
Hemoglobin: 14.1 g/dL (ref 13.0–17.0)
MCH: 31.1 pg (ref 26.0–34.0)
MCHC: 35.1 g/dL (ref 30.0–36.0)
MCV: 88.5 fL (ref 80.0–100.0)
Platelets: 313 10*3/uL (ref 150–400)
RBC: 4.54 MIL/uL (ref 4.22–5.81)
RDW: 13.4 % (ref 11.5–15.5)
WBC: 11.9 10*3/uL — ABNORMAL HIGH (ref 4.0–10.5)
nRBC: 0 % (ref 0.0–0.2)

## 2023-01-12 LAB — CBG MONITORING, ED: Glucose-Capillary: 99 mg/dL (ref 70–99)

## 2023-01-12 LAB — URINALYSIS, ROUTINE W REFLEX MICROSCOPIC
Bilirubin Urine: NEGATIVE
Glucose, UA: NEGATIVE mg/dL
Hgb urine dipstick: NEGATIVE
Ketones, ur: NEGATIVE mg/dL
Leukocytes,Ua: NEGATIVE
Nitrite: NEGATIVE
Protein, ur: 30 mg/dL — AB
Specific Gravity, Urine: 1.021 (ref 1.005–1.030)
pH: 6 (ref 5.0–8.0)

## 2023-01-12 LAB — TROPONIN I (HIGH SENSITIVITY): Troponin I (High Sensitivity): 3 ng/L (ref <18)

## 2023-01-12 NOTE — Discharge Instructions (Addendum)
  Contact a health care provider if: You have episodes of near fainting. Get help right away if: You faint. You hit your head or are injured after fainting. You have any of these symptoms that may indicate trouble with your heart: Fast or irregular heartbeats (palpitations). Unusual pain in your chest, abdomen, or back. Shortness of breath. You have a seizure. You have a severe headache. You are confused. You have vision problems. You have severe weakness or trouble walking. You are bleeding from your mouth or rectum, or you have black or tarry stool. These symptoms may represent a serious problem that is an emergency. Do not wait to see if your symptoms will go away. Get medical help right away. Call your local emergency services (911 in the [place] Do not drive yourself to the hospital. 

## 2023-01-12 NOTE — ED Provider Notes (Signed)
[place] EMERGENCY DEPARTMENT AT [facility] Provider Note   CSN: [id number] Arrival date & time: 01/12/23  1003     History  Chief Complaint  Patient presents with   Near Syncope    Joseph Vazquez is a 32 y.o. male who presents emergency department with chief complaint of loss of consciousness.  Patient reports that he was at work today and the next thing he knows he was on the ground.  His coworker said "you passed out" and patient replied "no I did not."  He states that his coworkers would not let him up.  He denies any prodrome to loss of consciousness.  He denies any personal history of LOC.  He was recently prescribed testosterone injections which she took 2 days ago.  He denies chest pain or shortness of breath.  He has no new medication changes.  Patient does take glycopyrrolate for excessive sweating, Wellbutrin and gabapentin for chronic sciatic symptoms.  He has no recent dosage changes.  Patient denies family history of sudden cardiac death.   Near Syncope       Home Medications Prior to Admission medications   Medication Sig Start Date End Date Taking? Authorizing Provider  cyclobenzaprine (FLEXERIL) 5 MG tablet Take 1 tablet (5 mg total) by mouth 2 (two) times daily as needed for muscle spasms. 07/08/21   Gustavus Bryant, FNP  gabapentin (NEURONTIN) 300 MG capsule TAKE 2 CAPSULES BY MOUTH 3 TIMES A DAY 10/28/22   Persons, [place], [place]  HYDROcodone-acetaminophen (NORCO) 7.5-325 MG tablet Take 1 tablet by mouth every 12 (twelve) hours as needed for moderate pain. 06/11/21   Kerrin Champagne, MD  HYDROcodone-acetaminophen (NORCO) 7.5-325 MG tablet Take 1 tablet by mouth every 6 (six) hours as needed for moderate pain. 01/09/22   Kathryne Hitch, MD  HYDROcodone-acetaminophen (NORCO/VICODIN) 5-325 MG tablet Take 1 tablet by mouth every 12 (twelve) hours as needed for moderate pain. 04/15/22   Eldred Manges, MD  meloxicam (MOBIC) 15 MG tablet TAKE 1 TABLET (15 MG  TOTAL) BY MOUTH DAILY. 11/16/22   Persons, [place], [place]  methylPREDNISolone (MEDROL DOSEPAK) 4 MG TBPK tablet Take as directed with food 12/03/22   Persons, [place], [place]  oxyCODONE (ROXICODONE) 5 MG immediate release tablet Take 1 tablet (5 mg total) by mouth every 4 (four) hours as needed for severe pain. 08/17/21   Kommor, Wyn Forster, MD      Allergies    Patient has no known allergies.    Review of Systems   Review of Systems  Cardiovascular:  Positive for near-syncope.    Physical Exam Updated Vital Signs BP (!) 144/91 (BP Location: Left Arm)   Pulse 94   Temp (!) 97.5 F (36.4 C) (Oral)   Resp 18   Ht 6' (1.829 m)   Wt 127 kg   SpO2 100%   BMI 37.97 kg/m  Physical Exam Vitals and nursing note reviewed.  Constitutional:      General: He is not in acute distress.    Appearance: He is well-developed. He is not diaphoretic.  HENT:     Head: Normocephalic and atraumatic.  Eyes:     General: No scleral icterus.    Conjunctiva/sclera: Conjunctivae normal.  Cardiovascular:     Rate and Rhythm: Normal rate and regular rhythm.     Heart sounds: Normal heart sounds. No murmur heard.    No gallop.  Pulmonary:     Effort: Pulmonary effort is normal. No respiratory  distress.     Breath sounds: Normal breath sounds.  Abdominal:     Palpations: Abdomen is soft.     Tenderness: There is no abdominal tenderness.  Musculoskeletal:     Cervical back: Normal range of motion and neck supple.  Skin:    General: Skin is warm and dry.  Neurological:     Mental Status: He is alert.  Psychiatric:        Behavior: Behavior normal.     ED Results / Procedures / Treatments   Labs (all labs ordered are listed, but only abnormal results are displayed) Labs Reviewed  CBC - Abnormal; Notable for the following components:      Result Value   WBC 11.9 (*)    All other components within normal limits  URINALYSIS, ROUTINE W REFLEX MICROSCOPIC - Abnormal; Notable for the following  components:   Protein, ur 30 (*)    Bacteria, UA RARE (*)    All other components within normal limits  BASIC METABOLIC PANEL  CBG MONITORING, ED  CBG MONITORING, ED  TROPONIN I (HIGH SENSITIVITY)    EKG None  Radiology No results found.  Procedures Procedures    Medications Ordered in ED Medications - No data to display  ED Course/ Medical Decision Making/ A&P Clinical Course as of 01/12/23 1358  Tue Jan 12, 2023  1238 Urinalysis, Routine w reflex microscopic -Urine, Clean Catch(!) [AH]  1238 CBC(!) [AH]  1238 Basic metabolic panel [AH]  1238 ED EKG EKG shows sinus rhythm at a rate of 88 with borderline prolonged QTc at 488 ms. [AH]  1355 DG Chest [facility] and interpreted a 1 view chest x-ray which shows no acute findings including no obvious cardiomegaly. [AH]    Clinical Course User Index [AH] Arthor Captain, PA-C                                 Medical Decision Making This patient presents to the ED for concern of syncope, this involves an extensive number of treatment options, and is a complaint that carries with it a high risk of complications and morbidity.  The differential for syncope is extensive and includes, but is not limited to: arrythmia (Vtach, SVT, SSS, sinus arrest, AV block, bradycardia) aortic stenosis, AMI, HOCM, PE, atrial myxoma, pulmonary hypertension, orthostatic hypotension, (hypovolemia, drug effect, GB syndrome, micturition, cough, swall) carotid sinus sensitivity, Seizure, TIA/CVA, hypoglycemia,  Vertigo.   Co morbidities:       has a past medical history of Back pain.   Social Determinants of Health:       SDOH Screenings Food Insecurity: No Food Insecurity (12/08/2022)     Received from [facility] Transportation Needs: No Transportation Needs (12/08/2022)     Received from [facility] Utilities: Not At Risk (12/08/2022)     Received from [facility] Financial  Resource Strain: Low Risk  (12/08/2022)     [facility] from [facility] Social Connections: Unknown (06/12/2021)     [facility] from [facility] Tobacco Use: High Risk (01/12/2023)   Additional history:  {Additional history obtained from girlfriend at bedside   Lab Tests:  I Ordered, and personally interpreted labs.  The pertinent results include:   Labs reviewed, no significant findings including negative troponin level  Imaging Studies:  Per ED course  Cardiac Monitoring/ECG:       The  patient was maintained on a cardiac monitor.  I personally viewed and interpreted the cardiac monitored which showed an underlying rhythm of: No abnormalities  Medicines ordered and prescription drug management:  Test Considered:       CT angiogram however patient is PERC negative  Critical Interventions:         Consultations Obtained:   Problem List / ED Course:       (R55) Syncope and collapse  (primary encounter diagnosis) Plan: Ambulatory referral to Cardiology   MDM: Patient here for syncope.  Of concern is the fact that he had no prodrome.  Given that he had no prodrome the patient and I discussed outpatient precautions including driving, using heavy machinery, baths, swimming alone, being on ladders or high places where he could be injured if he loses consciousness.  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  Patient has been given a urgent follow-up with cardiology as I suspect he will need cardiac monitoring.  Understands reasons to seek immediate medical care and appears otherwise appropriate for discharge at this time.   Dispostion:  After consideration of the diagnostic results and the patients response to treatment, I feel that the patent would benefit from discharge with strict return precautions and close follow-up.    Amount and/or Complexity of Data Reviewed Labs: ordered.  Decision-making details documented in ED Course. Radiology: ordered. Decision-making details documented in ED Course. ECG/medicine tests:  Decision-making details documented in ED Course.          Final Clinical Impression(s) / ED Diagnoses Final diagnoses:  None    Rx / DC Orders ED Discharge Orders     None         Arthor Captain, PA-C 01/12/23 1450    Alvira Monday, MD 01/13/23 0101

## 2023-01-12 NOTE — ED Triage Notes (Signed)
Pt arrived with medic reporting syncopal episode at work this morning. Friend states he did hit his head. Patient denies LOC or had injury. Patient c/o nausea. Patient has felt like he has been fighting a virus over the last few days.

## 2023-01-14 ENCOUNTER — Ambulatory Visit: Payer: Commercial Managed Care - HMO | Attending: Cardiovascular Disease | Admitting: Cardiovascular Disease

## 2023-01-14 ENCOUNTER — Ambulatory Visit: Payer: Commercial Managed Care - HMO

## 2023-01-14 ENCOUNTER — Encounter: Payer: Self-pay | Admitting: Cardiovascular Disease

## 2023-01-14 VITALS — BP 120/86 | HR 88 | Ht 72.0 in | Wt 274.0 lb

## 2023-01-14 DIAGNOSIS — R55 Syncope and collapse: Secondary | ICD-10-CM

## 2023-01-14 NOTE — Patient Instructions (Signed)
Medication Instructions:  Your physician recommends that you continue on your current medications as directed. Please refer to the Current Medication list given to you today.    *If you need a refill on your cardiac medications before your next appointment, please call your pharmacy*   Lab Work: None    If you have labs (blood work) drawn today and your tests are completely normal, you will receive your results only by: MyChart Message (if you have MyChart) OR A paper copy in the mail If you have any lab test that is abnormal or we need to change your treatment, we will call you to review the results.   Testing/Procedures: Echo will be scheduled at [address].  Your physician has requested that you have an echocardiogram. Echocardiography is a painless test that uses sound waves to create images of your heart. It provides your doctor with information about the size and shape of your heart and how well your heart's chambers and valves are working. This procedure takes approximately one hour. There are no restrictions for this procedure. Please do NOT wear cologne, perfume, aftershave, or lotions (deodorant is allowed). Please arrive 15 minutes prior to your appointment time.   ZIO XT- Long Term Monitor Instructions  Your physician has requested you wear a ZIO patch monitor for 7 days.  This is a single patch monitor. [employer] supplies one patch monitor per enrollment. Additional stickers are not available. Please do not apply patch if you will be having a Nuclear Stress Test,  Echocardiogram, Cardiac CT, MRI, or Chest Xray during the period you would be wearing the  monitor. The patch cannot be worn during these tests. You cannot remove and re-apply the  ZIO XT patch monitor.  Your ZIO patch monitor will be mailed 3 day USPS to your address on file. It may take 3-5 days  to receive your monitor after you have been enrolled.  Once you have received your monitor, please  review the enclosed instructions. Your monitor  has already been registered assigning a specific monitor serial # to you.  Billing and Patient Assistance Program Information  We have supplied [employer] with any of your insurance information on file for billing purposes. [employer] offers a sliding scale Patient Assistance Program for patients that do not have  insurance, or whose insurance does not completely cover the cost of the ZIO monitor.  You must apply for the Patient Assistance Program to qualify for this discounted rate.  To apply, please call [employer] at [phone], select option 4, select option 2, ask to apply for  Patient Assistance Program. Meredeth Ide will ask your household income, and how many people  are in your household. They will quote your out-of-pocket cost based on that information.  [employer] will also be able to set up a 47-month, interest-free payment plan if needed.  Applying the monitor   Shave hair from upper left chest.  Hold abrader disc by orange tab. Rub abrader in 40 strokes over the upper left chest as  indicated in your monitor instructions.  Clean area with 4 enclosed alcohol pads. Let dry.  Apply patch as indicated in monitor instructions. Patch will be placed under collarbone on left  side of chest with arrow pointing upward.  Rub patch adhesive wings for 2 minutes. Remove white label marked "1". Remove the white  label marked "2". Rub patch adhesive wings for 2 additional minutes.  While looking in a mirror, press and release button in center  of patch. A small green light will  flash 3-4 times. This will be your only indicator that the monitor has been turned on.  Do not shower for the first 24 hours. You may shower after the first 24 hours.  Press the button if you feel a symptom. You will hear a small click. Record Date, Time and  Symptom in the Patient Logbook.  When you are ready to remove the patch, follow instructions on the last 2 pages of Patient   Logbook. Stick patch monitor onto the last page of Patient Logbook.  Place Patient Logbook in the blue and white box. Use locking tab on box and tape box closed  securely. The blue and white box has prepaid postage on it. Please place it in the mailbox as  soon as possible. Your physician should have your test results approximately 7 days after the  monitor has been mailed back to [facility].  Call [facility] Customer Care at [phone] if you have questions regarding  your ZIO XT patch monitor. Call them immediately if you see an orange light blinking on your  monitor.  If your monitor falls off in less than 4 days, contact our Monitor department at [phone].  If your monitor becomes loose or falls off after 4 days call [employer] at [phone] for  suggestions on securing your monitor    Follow-Up: At [facility], you and your health needs are our priority.  As part of our continuing mission to provide you with exceptional heart care, we have created designated Provider Care Teams.  These Care Teams include your primary Cardiologist (physician) and Advanced Practice Providers (APPs -  Physician Assistants and Nurse Practitioners) who all work together to provide you with the care you need, when you need it.  We recommend signing up for the patient portal called "MyChart".  Sign up information is provided on this After Visit Summary.  MyChart is used to connect with patients for Virtual Visits (Telemedicine).  Patients are able to view lab/test results, encounter notes, upcoming appointments, etc.  Non-urgent messages can be sent to your provider as well.   To learn more about what you can do with MyChart, go to [email or web address].    Your next appointment:   6-8  week(s)  The format for your next appointment:   In Person  Provider:   Lennie Odor, MD    Other Instructions

## 2023-01-14 NOTE — Progress Notes (Signed)
Cardiology Office Note:  .   Date:  01/14/2023  ID:  Joseph Vazquez, DOB Jun 10, 1990, MRN [id number] PCP: Shon Hale, MD  [facility] Providers Cardiologist:  None    History of Present Illness: .   Jael Krause is a 32 y.o. male with history of bulging lumbar disc who presents for the evaluation of syncope at the request of Shon Hale, MD. Seen in ER 01/12/2023 for syncope. Negative work-up.    History of Present Illness   Mr. Kolesnik, a 32 year old male with a history of bulging lumbar disc, presents for evaluation of a recent syncopal episode. The patient reports that the episode occurred while at work during a break on 12/5. He was eating an apple and walking when he suddenly stiffened up, his eyes rolled back, and he fell. Coworkers reported possible seizure-like activity. The patient denies any prior history of seizures or syncopal episodes. He denies any chest pain or trouble breathing prior to the episode. He reports feeling normal about ten minutes after the episode. He did not urinate or defecate with the event.   The patient has a history of back pain and hyperhidrosis. He takes gabapentin for nerve pain related to sciatica, Wellbutrin for depression, and glycopyrrolate for hyperhidrosis. He reports no recent changes in these medications. He uses a vape and occasionally uses legal marijuana, but denies use on the day of the syncopal episode. He denies any family history of heart disease. No etoh.       ROS: All other ROS reviewed and negative. Pertinent positives noted in the HPI.     Studies Reviewed: Marland Kitchen   EKG Interpretation Date/Time:  Thursday January 14 2023 15:50:41 EST Ventricular Rate:  84 PR Interval:  162 QRS Duration:  96 QT Interval:  372 QTC Calculation: 439 R Axis:   18  Text Interpretation: Normal sinus rhythm Normal ECG Confirmed by Lennie Odor [phone]) on 01/14/2023 3:57:47 PM   Physical Exam:   VS:  BP 120/86 (BP Location: Left Arm,  Patient Position: Sitting, Cuff Size: Large)   Pulse 88   Ht 6' (1.829 m)   Wt 274 lb (124.3 kg)   SpO2 95%   BMI 37.16 kg/m    Wt Readings from Last 3 Encounters:  01/14/23 274 lb (124.3 kg)  01/12/23 280 lb (127 kg)  07/23/22 260 lb (117.9 kg)    GEN: Well nourished, well developed in no acute distress NECK: No JVD; No carotid bruits CARDIAC: RRR, no murmurs, rubs, gallops RESPIRATORY:  Clear to auscultation without rales, wheezing or rhonchi  ABDOMEN: Soft, non-tender, non-distended EXTREMITIES:  No edema; No deformity  ASSESSMENT AND PLAN: .   Assessment and Plan    Syncope First episode of syncope with possible seizure-like activity reported by witnesses. No prior history of seizures. No chest pain or trouble breathing prior to the episode. Normal EKG and thyroid studies. Unclear etiology - could be vasovagal episode or possible seizure. Less likely arrhythmia but lack of prodrome is concerning.  -Order 7-day Zio to exclude arrhythmia. -Order echocardiogram. -Refer to neurology for evaluation of possible seizure. -Restrict driving until cardiac pathology and seizure have been ruled out. -Return for follow-up in 6-8 weeks.  Work Restrictions Due to recent syncope episode and ongoing evaluation, patient should be restricted to light duty at work. -Provide letter for employer indicating patient should be on light duty, avoid operating heavy machinery or climbing ladders until workup is complete.  Follow-up: Return in about 2 months (around 03/17/2023).   Signed, Lenna Gilford. Flora Lipps, MD, [facility]  [facility]  [address] [place], [place] [address] [phone]  6:51 PM

## 2023-01-14 NOTE — Progress Notes (Unsigned)
Enrolled patient for a 7 day Zio XT monitor to be mailed to patients home.  

## 2023-01-19 ENCOUNTER — Telehealth: Payer: Self-pay | Admitting: Cardiovascular Disease

## 2023-01-19 NOTE — Telephone Encounter (Signed)
This was a ZIO that was ordered by gen cards at [employer].  We do not manage this patient's device here in EP Device clinic.   Will forward to their triage team.

## 2023-01-19 NOTE — Telephone Encounter (Signed)
Patient stated he received and started wearing his heart monitor but today is came off due to sweating and he is unable to get it to stick.  Patient wants advice on next steps.

## 2023-01-20 ENCOUNTER — Encounter: Payer: Self-pay | Admitting: Diagnostic Neuroimaging

## 2023-01-20 ENCOUNTER — Other Ambulatory Visit: Payer: Self-pay | Admitting: *Deleted

## 2023-01-20 ENCOUNTER — Ambulatory Visit: Payer: Commercial Managed Care - HMO | Attending: Cardiology

## 2023-01-20 ENCOUNTER — Ambulatory Visit (INDEPENDENT_AMBULATORY_CARE_PROVIDER_SITE_OTHER): Payer: Commercial Managed Care - HMO | Admitting: Diagnostic Neuroimaging

## 2023-01-20 VITALS — BP 126/78 | HR 76 | Ht 72.0 in | Wt 282.2 lb

## 2023-01-20 DIAGNOSIS — R569 Unspecified convulsions: Secondary | ICD-10-CM

## 2023-01-20 DIAGNOSIS — R55 Syncope and collapse: Secondary | ICD-10-CM | POA: Diagnosis not present

## 2023-01-20 NOTE — Progress Notes (Signed)
[facility]  PATIENT: Joseph Vazquez DOB: 06/23/1990  REFERRING CLINICIAN: Shon Hale, * HISTORY FROM: patient  REASON FOR VISIT: new consult   HISTORICAL  CHIEF COMPLAINT:  Chief Complaint  Patient presents with   New Patient (Initial Visit)    Patient in room #7 and alone. Patient states he passed out at work this pass Tuesday. Patient states he was fine then the next he was in the hospital.    HISTORY OF PRESENT ILLNESS:   32 year old male here for evaluation of seizure or syncope.  01/12/2023 patient was at work, on break, when all of a sudden he became stiff and passed out.  He did not have any prodromal warnings for this event.  He bit his tongue.  He had some shaking.  He was "foaming at the mouth".  Episode lasted for few minutes and then resolved.  He woke up fairly quickly.  He was little bit confused when he woke up but came back to baseline within 15 to 20 minutes.  He was taken the hospital for evaluation.  Initially he was worked up for syncope evaluation.  He has seen cardiology.  History of concussion x 2 when he was younger.  Also history of "spinal meningitis" as an infant.  No other events of syncope or seizure in the past.  No family history of seizures.  He works as a Chartered certified accountant.   REVIEW OF SYSTEMS: Full 14 system review of systems performed and negative with exception of: as per HPI.   ALLERGIES: No Known Allergies  HOME MEDICATIONS: Outpatient Medications Prior to Visit  Medication Sig Dispense Refill   buPROPion (WELLBUTRIN XL) 300 MG 24 hr tablet Take 300 mg by mouth every morning.     gabapentin (NEURONTIN) 300 MG capsule TAKE 2 CAPSULES BY MOUTH 3 TIMES A DAY 180 capsule 2   glycopyrrolate (ROBINUL) 2 MG tablet Take 2 mg by mouth 2 (two) times daily.     testosterone cypionate (DEPOTESTOSTERONE CYPIONATE) 200 MG/ML injection Inject 200 mg into the skin every 14 (fourteen) days.     cyclobenzaprine (FLEXERIL) 5 MG  tablet Take 1 tablet (5 mg total) by mouth 2 (two) times daily as needed for muscle spasms. 20 tablet 0   No facility-administered medications prior to visit.    PAST MEDICAL HISTORY: Past Medical History:  Diagnosis Date   Back pain     PAST SURGICAL HISTORY: Past Surgical History:  Procedure Laterality Date   HERNIA REPAIR     VENTRAL HERNIA REPAIR N/A 08/07/2018   Procedure: HERNIA REPAIR umblical;  Surgeon: Berna Bue, MD;  Location: [facility];  Service: General;  Laterality: N/A;    FAMILY HISTORY: Family History  Problem Relation Age of Onset   Cancer Mother    Healthy Father     SOCIAL HISTORY: Social History   Socioeconomic History   Marital status: Single    Spouse name: Not on file   Number of children: Not on file   Years of education: Not on file   Highest education level: Not on file  Occupational History   Occupation: [employer] - Chartered certified accountant  Tobacco Use   Smoking status: Former    Current packs/day: 0.05    Types: Cigarettes   Smokeless tobacco: Current  Vaping Use   Vaping status: Every Day  Substance and Sexual Activity   Alcohol use: Yes    Comment: Socially   Drug use: Not Currently    Types: Marijuana  Sexual activity: Yes  Other Topics Concern   Not on file  Social History Narrative   Not on file   Social Determinants of Health   Financial Resource Strain: Low Risk  (12/08/2022)   Received from [facility]   Overall Financial Resource Strain (CARDIA)    Difficulty of Paying Living Expenses: Not very hard  Food Insecurity: No Food Insecurity (12/08/2022)   Received from [facility]   Hunger Vital Sign    Worried About Running Out of Food in the Last Year: Never true    Ran Out of Food in the Last Year: Never true  Transportation Needs: No Transportation Needs (12/08/2022)   Received from [facility] - Transportation    In the past 12 months, has  lack of transportation kept you from medical appointments or from getting medications?: No    Lack of Transportation (Non-Medical): No  Physical Activity: Not on file  Stress: Not on file  Social Connections: Unknown (06/12/2021)   Received from [facility]   Social Network    Social Network: Not on file  Intimate Partner Violence: Unknown (06/12/2021)   Received from [facility]   HITS    Physically Hurt: Not on file    Insult or Talk Down To: Not on file    Threaten Physical Harm: Not on file    Scream or Curse: Not on file     PHYSICAL EXAM  GENERAL EXAM/CONSTITUTIONAL: Vitals:  Vitals:   01/20/23 0904  BP: 126/78  Pulse: 76  Weight: 282 lb 3.2 oz (128 kg)  Height: 6' (1.829 m)   Body mass index is 38.27 kg/m. Wt Readings from Last 3 Encounters:  01/20/23 282 lb 3.2 oz (128 kg)  01/14/23 274 lb (124.3 kg)  01/12/23 280 lb (127 kg)   Patient is in no distress; well developed, nourished and groomed; neck is supple  CARDIOVASCULAR: Examination of carotid arteries is normal; no carotid bruits Regular rate and rhythm, no murmurs Examination of peripheral vascular system by observation and palpation is normal  EYES: Ophthalmoscopic exam of optic discs and posterior segments is normal; no papilledema or hemorrhages No results found.  MUSCULOSKELETAL: Gait, strength, tone, movements noted in Neurologic exam below  NEUROLOGIC: MENTAL STATUS:      No data to display         awake, alert, oriented to person, place and time recent and remote memory intact normal attention and concentration language fluent, comprehension intact, naming intact fund of knowledge appropriate  CRANIAL NERVE:  2nd - no papilledema on fundoscopic exam 2nd, 3rd, 4th, 6th - pupils equal and reactive to light, visual fields full to confrontation, extraocular muscles intact, no nystagmus 5th - facial sensation symmetric 7th - facial strength symmetric 8th  - hearing intact 9th - palate elevates symmetrically, uvula midline 11th - shoulder shrug symmetric 12th - tongue protrusion midline  MOTOR:  normal bulk and tone, full strength in the BUE, BLE  SENSORY:  normal and symmetric to light touch, temperature, vibration  COORDINATION:  finger-nose-finger, fine finger movements normal  REFLEXES:  deep tendon reflexes present and symmetric  GAIT/STATION:  narrow based gait     DIAGNOSTIC DATA (LABS, IMAGING, TESTING) - I reviewed patient records, labs, notes, testing and imaging myself where available.  Lab Results  Component Value Date   WBC 11.9 (H) 01/12/2023   HGB 14.1 01/12/2023   HCT 40.2 01/12/2023   MCV  88.5 01/12/2023   PLT 313 01/12/2023      Component Value Date/Time   NA 138 01/12/2023 1127   K 4.9 01/12/2023 1127   CL 102 01/12/2023 1127   CO2 27 01/12/2023 1127   GLUCOSE 100 (H) 01/12/2023 1127   BUN 13 01/12/2023 1127   CREATININE 0.99 01/12/2023 1127   CALCIUM 9.4 01/12/2023 1127   PROT 8.3 (H) 08/17/2021 1444   ALBUMIN 4.7 08/17/2021 1444   AST 19 08/17/2021 1444   ALT 28 08/17/2021 1444   ALKPHOS 86 08/17/2021 1444   BILITOT 1.0 08/17/2021 1444   GFRNONAA >60 01/12/2023 1127   GFRAA >60 08/07/2018 0020   No results found for: "CHOL", "HDL", "LDLCALC", "LDLDIRECT", "TRIG", "CHOLHDL" No results found for: "HGBA1C" No results found for: "VITAMINB12" Lab Results  Component Value Date   TSH 0.60 09/17/2008     12/30/22 MRI brain [I reviewed images myself and agree with interpretation. -VRP]   1. No evidence of acute intracranial abnormality. 2. Motion limited pituitary protocol without convincing pituitary lesion identified.   ASSESSMENT AND PLAN  32 y.o. year old male here with:  Dx:  1. New onset seizure (HCC)    PLAN:  Loss of consciousness (01/12/23; no warning; + tongue biting, + stiffness, + shaking, + foaming at mouth; minimal post-ictal confusion; suspected new onset  seizure)  - check MRI brain, EEG  - consider reducing wellbutrin dosing (started early 2024 for depression; can lower threshold for seizures)   - According to [place] law, you can not drive unless you are seizure / syncope free for at least 6 months and under physician's care.   - Please maintain precautions. Do not participate in activities where a loss of awareness could harm you or someone else. No swimming alone, no tub bathing, no hot tubs, no driving, no operating motorized vehicles (cars, ATVs, motocycles, etc), lawnmowers, power tools or firearms. No standing at heights, such as rooftops, ladders or stairs. Avoid hot objects such as stoves, heaters, open fires. Wear a helmet when riding a bicycle, scooter, skateboard, etc. and avoid areas of traffic. Set your water heater to 120 degrees or less.  Orders Placed This Encounter  Procedures   MR BRAIN W WO CONTRAST   EEG adult   Return in about 6 months (around 07/21/2023) for MyChart visit (15 min).  I reviewed images, labs, notes, records myself. I summarized findings and reviewed with patient, for this high risk condition (new onset seizure) requiring high complexity decision making.    Suanne Marker, MD 01/20/2023, 10:14 AM Certified in Neurology, Neurophysiology and Neuroimaging  [facility] [address], [address] [place], [place] [address] [phone]

## 2023-01-20 NOTE — Addendum Note (Signed)
Addended by: Joycelyn Schmid R on: 01/20/2023 04:06 PM   Modules accepted: Level of Service

## 2023-01-20 NOTE — Patient Instructions (Addendum)
Loss of consciousness (01/12/23; no warning; + tongue biting, + stiffness, + shaking, + foaming at mouth; minimal post-ictal confusion)  - check MRI brain, EEG  - consider reducing wellbutrin dosing (started early 2024 for depression; can lower threshold for seizures)   - According to [place] law, you can not drive unless you are seizure / syncope free for at least 6 months and under physician's care.   - Please maintain precautions. Do not participate in activities where a loss of awareness could harm you or someone else. No swimming alone, no tub bathing, no hot tubs, no driving, no operating motorized vehicles (cars, ATVs, motocycles, etc), lawnmowers, power tools or firearms. No standing at heights, such as rooftops, ladders or stairs. Avoid hot objects such as stoves, heaters, open fires. Wear a helmet when riding a bicycle, scooter, skateboard, etc. and avoid areas of traffic. Set your water heater to 120 degrees or less.

## 2023-01-20 NOTE — Progress Notes (Unsigned)
ZIO XT monitor cancelled. [employer] contacted to cancel charges. Patients monitor fell off in less than 24 hours due to excessive perspiration, (medical condition).  [employer] serial # [id number] long term monitor with extra strips applied to patient.

## 2023-01-22 ENCOUNTER — Telehealth: Payer: Self-pay | Admitting: Cardiovascular Disease

## 2023-01-22 NOTE — Telephone Encounter (Signed)
Patient states he pressed the button on his heart monitor and expected to hear a "beep," but he didn't hear anything. Please advise.

## 2023-01-22 NOTE — Telephone Encounter (Signed)
Called and spoke to patient who had concerns about his heart monitor. Patient stated when he pressed the event button he did not hear it beep. Patient was given the number to customer service [phone]. He will call them for further instructions.

## 2023-01-22 NOTE — Telephone Encounter (Signed)
Spoke to patient and advised the patient to make sure the monitor was on and charged. Patient charged the monitor and he reports the light comes on. He verbalized he was told he should hear a beep when he pressed the button the monitor and he still has not heard the beep.

## 2023-01-22 NOTE — Telephone Encounter (Signed)
Patient is following up. He states the phone number he was given is not the correct number for the [employer]. Please advise.

## 2023-01-25 ENCOUNTER — Telehealth: Payer: Self-pay | Admitting: Diagnostic Neuroimaging

## 2023-01-25 NOTE — Telephone Encounter (Signed)
sent to GI they obtain Haynes Hoehn auth: [id number] exp. 01/25/23-02/23/23  [phone]

## 2023-01-25 NOTE — Telephone Encounter (Signed)
When I applied the monitor , I noticed it did not beep when I pressed the button, however the green light in the heart was blinking every 5 seconds, which indicated it is recording. I spoke with our representative at [employer] and he agreed. He did say that since it is a recorder, we would not be able to tell for certain until the monitor was shipped back and processed.  If your monitor comes up short on data, we could cancel charges and assign a different one. If you would feel more comfortable switching to a new monitor now, just give me a call.  I will have the charges cancelled and assign a new monitor. Please give me a call if you would like to do that instead.

## 2023-01-31 ENCOUNTER — Emergency Department (HOSPITAL_BASED_OUTPATIENT_CLINIC_OR_DEPARTMENT_OTHER): Payer: BC Managed Care – PPO

## 2023-01-31 ENCOUNTER — Other Ambulatory Visit: Payer: Self-pay

## 2023-01-31 ENCOUNTER — Encounter (HOSPITAL_BASED_OUTPATIENT_CLINIC_OR_DEPARTMENT_OTHER): Payer: Self-pay | Admitting: Emergency Medicine

## 2023-01-31 ENCOUNTER — Inpatient Hospital Stay (HOSPITAL_BASED_OUTPATIENT_CLINIC_OR_DEPARTMENT_OTHER)
Admission: EM | Admit: 2023-01-31 | Discharge: 2023-02-01 | DRG: 393 | Disposition: A | Discharge status: Home / Self Care | Payer: BC Managed Care – PPO | Attending: Surgery | Admitting: Surgery

## 2023-01-31 DIAGNOSIS — Z79899 Other long term (current) drug therapy: Secondary | ICD-10-CM

## 2023-01-31 DIAGNOSIS — K651 Peritoneal abscess: Secondary | ICD-10-CM | POA: Diagnosis present

## 2023-01-31 DIAGNOSIS — K420 Umbilical hernia with obstruction, without gangrene: Secondary | ICD-10-CM | POA: Diagnosis not present

## 2023-01-31 DIAGNOSIS — R109 Unspecified abdominal pain: Secondary | ICD-10-CM | POA: Diagnosis not present

## 2023-01-31 DIAGNOSIS — Z79890 Hormone replacement therapy (postmenopausal): Secondary | ICD-10-CM

## 2023-01-31 DIAGNOSIS — F17290 Nicotine dependence, other tobacco product, uncomplicated: Secondary | ICD-10-CM | POA: Diagnosis present

## 2023-01-31 LAB — COMPREHENSIVE METABOLIC PANEL
ALT: 28 U/L (ref 0–44)
AST: 18 U/L (ref 15–41)
Albumin: 4.3 g/dL (ref 3.5–5.0)
Alkaline Phosphatase: 79 U/L (ref 38–126)
Anion gap: 7 (ref 5–15)
BUN: 10 mg/dL (ref 6–20)
CO2: 28 mmol/L (ref 22–32)
Calcium: 8.8 mg/dL — ABNORMAL LOW (ref 8.9–10.3)
Chloride: 102 mmol/L (ref 98–111)
Creatinine, Ser: 1.03 mg/dL (ref 0.61–1.24)
GFR, Estimated: >60 mL/min (ref >60)
Glucose, Bld: 90 mg/dL (ref 70–99)
Potassium: 4 mmol/L (ref 3.5–5.1)
Sodium: 137 mmol/L (ref 135–145)
Total Bilirubin: 0.6 mg/dL (ref <1.2)
Total Protein: 7.2 g/dL (ref 6.5–8.1)

## 2023-01-31 LAB — CBC WITH DIFFERENTIAL/PLATELET
Abs Immature Granulocytes: 0.03 10*3/uL (ref 0.00–0.07)
Basophils Absolute: 0 10*3/uL (ref 0.0–0.1)
Basophils Relative: 0 %
Eosinophils Absolute: 0.1 10*3/uL (ref 0.0–0.5)
Eosinophils Relative: 1 %
HCT: 38.2 % — ABNORMAL LOW (ref 39.0–52.0)
Hemoglobin: 13.3 g/dL (ref 13.0–17.0)
Immature Granulocytes: 0 %
Lymphocytes Relative: 28 %
Lymphs Abs: 3.7 10*3/uL (ref 0.7–4.0)
MCH: 31.4 pg (ref 26.0–34.0)
MCHC: 34.8 g/dL (ref 30.0–36.0)
MCV: 90.3 fL (ref 80.0–100.0)
Monocytes Absolute: 0.9 10*3/uL (ref 0.1–1.0)
Monocytes Relative: 7 %
Neutro Abs: 8.4 10*3/uL — ABNORMAL HIGH (ref 1.7–7.7)
Neutrophils Relative %: 64 %
Platelets: 324 10*3/uL (ref 150–400)
RBC: 4.23 MIL/uL (ref 4.22–5.81)
RDW: 14.5 % (ref 11.5–15.5)
WBC: 13.2 10*3/uL — ABNORMAL HIGH (ref 4.0–10.5)
nRBC: 0 % (ref 0.0–0.2)

## 2023-01-31 LAB — LACTIC ACID, PLASMA: Lactic Acid, Venous: 0.8 mmol/L (ref 0.5–1.9)

## 2023-01-31 LAB — LIPASE, BLOOD: Lipase: 26 U/L (ref 11–51)

## 2023-01-31 MED ORDER — IOHEXOL 300 MG/ML  SOLN
100.0000 mL | Freq: Once | INTRAMUSCULAR | Status: AC | PRN
  Administered 2023-01-31: 100 mL via INTRAVENOUS

## 2023-01-31 MED ORDER — PIPERACILLIN-TAZOBACTAM 3.375 G IVPB 30 MIN
3.3750 g | Freq: Once | INTRAVENOUS | Status: AC
  Administered 2023-01-31: 3.375 g via INTRAVENOUS
  Filled 2023-01-31: qty 50

## 2023-01-31 MED ORDER — ONDANSETRON HCL 4 MG/2ML IJ SOLN
4.0000 mg | Freq: Once | INTRAMUSCULAR | Status: AC
  Administered 2023-01-31: 4 mg via INTRAVENOUS
  Filled 2023-01-31: qty 2

## 2023-01-31 MED ORDER — MORPHINE SULFATE (PF) 4 MG/ML IV SOLN
4.0000 mg | Freq: Once | INTRAVENOUS | Status: AC
Start: 2023-01-31 — End: 2023-01-31
  Administered 2023-01-31: 4 mg via INTRAVENOUS
  Filled 2023-01-31: qty 1

## 2023-01-31 NOTE — ED Notes (Signed)
Called [facility] to transport patient to [facility] Rm# [id number]

## 2023-01-31 NOTE — ED Notes (Signed)
 Initial contact made. Pt is resting in bed and verbalizes no needs at this time

## 2023-01-31 NOTE — ED Provider Notes (Signed)
[facility] AT [facility] Provider Note   CSN: [id number] Arrival date & time: 01/31/23  1726     History  No chief complaint on file.   Joseph Vazquez is a 32 y.o. male.  With a history of ventral hernia repair who presents for abdominal hernia.  3 separate hernia repair surgeries in the past.  Awoke this morning with abdominal pain and felt a protruding hernia just superior to navel.  Overlying erythema as well.  Some nausea with 1 episode of vomiting.  No fevers chills or changes in bowel habits.  HPI     Home Medications Prior to Admission medications   Medication Sig Start Date End Date Taking? Authorizing Provider  buPROPion (WELLBUTRIN XL) 300 MG 24 hr tablet Take 300 mg by mouth every morning.    [provider]  gabapentin (NEURONTIN) 300 MG capsule TAKE 2 CAPSULES BY MOUTH 3 TIMES A DAY 10/28/22   Persons, [place], [place]  glycopyrrolate (ROBINUL) 2 MG tablet Take 2 mg by mouth 2 (two) times daily. 01/11/23   [provider]  testosterone cypionate (DEPOTESTOSTERONE CYPIONATE) 200 MG/ML injection Inject 200 mg into the skin every 14 (fourteen) days. 01/17/23   [provider]      Allergies    Patient has no known allergies.    Review of Systems   Review of Systems  Physical Exam Updated Vital Signs BP 113/74   Pulse 76   Temp 99 F (37.2 C) (Oral)   Resp 16   Wt 124.7 kg   SpO2 93%   BMI 37.30 kg/m  Physical Exam Vitals and nursing note reviewed.  HENT:     Head: Normocephalic and atraumatic.  Eyes:     Pupils: Pupils are equal, round, and reactive to light.  Cardiovascular:     Rate and Rhythm: Normal rate and regular rhythm.  Pulmonary:     Effort: Pulmonary effort is normal.     Breath sounds: Normal breath sounds.  Abdominal:     Palpations: Abdomen is soft.     Hernia: A hernia is present.     Comments: Ventral hernia just superior to umbilicus with overlying patchy erythema Unable to reduce  with steady pressure Moderately tender to palpation No other abdominal tenderness No rebound rigidity or guarding  Skin:    General: Skin is warm and dry.  Neurological:     Mental Status: He is alert.  Psychiatric:        Mood and Affect: Mood normal.     ED Results / Procedures / Treatments   Labs (all labs ordered are listed, but only abnormal results are displayed) Labs Reviewed  COMPREHENSIVE METABOLIC PANEL - Abnormal; Notable for the following components:      Result Value   Calcium 8.8 (*)    All other components within normal limits  CBC WITH DIFFERENTIAL/PLATELET - Abnormal; Notable for the following components:   WBC 13.2 (*)    HCT 38.2 (*)    Neutro Abs 8.4 (*)    All other components within normal limits  LIPASE, BLOOD  LACTIC ACID, PLASMA  LACTIC ACID, PLASMA    EKG None  Radiology CT ABDOMEN PELVIS W CONTRAST Result Date: 01/31/2023 CLINICAL DATA:  Abdominal wall pain, umbilical hernia, status post repair x2 EXAM: CT ABDOMEN AND PELVIS WITH CONTRAST TECHNIQUE: Multidetector CT imaging of the abdomen and pelvis was performed using the standard protocol following bolus administration of intravenous contrast. RADIATION DOSE REDUCTION: This exam was  performed according to the departmental dose-optimization program which includes automated exposure control, adjustment of the mA and/or kV according to patient size and/or use of iterative reconstruction technique. CONTRAST:  OMNIPAQUE IOHEXOL 300 MG/ML  SOLN COMPARISON:  CT abdomen 02/17/2022 FINDINGS: Lower chest: No acute abnormality. Hepatobiliary: Unremarkable liver. Normal gallbladder. No biliary dilation. Pancreas: Unremarkable. Spleen: Unremarkable. Adrenals/Urinary Tract: Normal adrenal glands. No urinary calculi or hydronephrosis. Bladder is unremarkable. Stomach/Bowel: Normal caliber large and small bowel. No bowel wall thickening. The appendix is normal.Stomach is within normal limits.  Vascular/Lymphatic: No significant vascular findings are present. No enlarged abdominal or pelvic lymph nodes. Reproductive: Unremarkable. Other: No free intraperitoneal fluid or air. Peripherally enhancing fluid collection measuring 1.8 x 1.6 cm in the midline ventral abdominal wall at the site of the umbilical hernia. There is inflammatory changes of the herniated fat with soft tissue thickening and adjacent inflammatory fat stranding. Musculoskeletal: No acute fracture. IMPRESSION: Constellation of findings concerning for strangulated umbilical hernia with a small developing phlegmon/abscess. Clinical correlation recommended. Electronically Signed   By: Minerva Fester M.D.   On: 01/31/2023 22:27    Procedures Procedures    Medications Ordered in ED Medications  piperacillin-tazobactam (ZOSYN) IVPB 3.375 g (3.375 g Intravenous New Bag/Given 01/31/23 2337)  morphine (PF) 4 MG/ML injection 4 mg (4 mg Intravenous Given 01/31/23 2139)  ondansetron (ZOFRAN) injection 4 mg (4 mg Intravenous Given 01/31/23 2139)  iohexol (OMNIPAQUE) 300 MG/ML solution 100 mL (100 mLs Intravenous Contrast Given 01/31/23 2210)    ED Course/ Medical Decision Making/ A&P Clinical Course as of 01/31/23 2349  Sun Jan 31, 2023  2301 CT abdomen pelvis concerning for strangulated umbilical hernia with developing phlegmon abscess.  Considering patient's history of similar hernias and clinical picture here today, this is certainly most likely.  Will order Zosyn and paged general surgery [MP]  2347 Discussed findings with patient and with Dr.Stechschulte (general surgery).  Patient will be admitted and transferred to [facility] surgical service.  No need for emergent surgical intervention at this time.  Will provide another dose of morphine here for pain control and place admission orders.  We will continue to monitor the patient in [facility] until he is transferred via EMS [MP]    Clinical Course User Index [MP]  Royanne Foots, DO                                 Medical Decision Making 32 year old male with history as above including multiple surgeries for ventral hernia repair presenting for suspected ventral hernia.  Awoke this morning with protrusion just superior to umbilicus unable to reduce.  Overlying skin redness.  I was unable to reduce this on my exam.  Presentation concerning for ventral hernia incarceration.  No overt evidence of severe illness based on vital signs and appearance.  Will obtain laboratory workup including CBC CMP venous lactate and CT abdomen pelvis to evaluate hernia here.  Pain control with morphine and Zofran for nausea  Amount and/or Complexity of Data Reviewed Labs: ordered. Radiology: ordered.  Risk Prescription drug management. Decision regarding hospitalization.           Final Clinical Impression(s) / ED Diagnoses Final diagnoses:  Strangulated umbilical hernia    Rx / DC Orders ED Discharge Orders     None         Royanne Foots, DO 01/31/23 2349

## 2023-01-31 NOTE — ED Triage Notes (Signed)
Woke up yesterday and his naval was pouching out,has had 3 surgeries for ths.

## 2023-01-31 NOTE — Progress Notes (Signed)
ED Pharmacy Antibiotic Sign Off An antibiotic consult was received from an ED provider for Zosyn per pharmacy dosing for intra-abdominal infection. A chart review was completed to assess appropriateness.   The following one time order(s) were placed:   -Zosyn 3.375gm IV x1  Further antibiotic and/or antibiotic pharmacy consults should be ordered by the admitting provider if indicated.   Thank you for allowing pharmacy to be a part of this patient's care.   Arabella Merles, [facility]  Clinical Pharmacist 01/31/23 11:25 PM

## 2023-02-01 DIAGNOSIS — Z79899 Other long term (current) drug therapy: Secondary | ICD-10-CM | POA: Diagnosis not present

## 2023-02-01 DIAGNOSIS — Z79890 Hormone replacement therapy (postmenopausal): Secondary | ICD-10-CM | POA: Diagnosis not present

## 2023-02-01 DIAGNOSIS — K420 Umbilical hernia with obstruction, without gangrene: Secondary | ICD-10-CM | POA: Diagnosis present

## 2023-02-01 DIAGNOSIS — F17290 Nicotine dependence, other tobacco product, uncomplicated: Secondary | ICD-10-CM | POA: Diagnosis present

## 2023-02-01 DIAGNOSIS — R109 Unspecified abdominal pain: Secondary | ICD-10-CM | POA: Diagnosis present

## 2023-02-01 DIAGNOSIS — K651 Peritoneal abscess: Secondary | ICD-10-CM | POA: Diagnosis present

## 2023-02-01 LAB — CBC
HCT: 38 % — ABNORMAL LOW (ref 39.0–52.0)
Hemoglobin: 13.1 g/dL (ref 13.0–17.0)
MCH: 31.7 pg (ref 26.0–34.0)
MCHC: 34.5 g/dL (ref 30.0–36.0)
MCV: 92 fL (ref 80.0–100.0)
Platelets: 310 10*3/uL (ref 150–400)
RBC: 4.13 MIL/uL — ABNORMAL LOW (ref 4.22–5.81)
RDW: 14.6 % (ref 11.5–15.5)
WBC: 10.8 10*3/uL — ABNORMAL HIGH (ref 4.0–10.5)
nRBC: 0 % (ref 0.0–0.2)

## 2023-02-01 LAB — BASIC METABOLIC PANEL
Anion gap: 8 (ref 5–15)
BUN: 9 mg/dL (ref 6–20)
CO2: 25 mmol/L (ref 22–32)
Calcium: 8.8 mg/dL — ABNORMAL LOW (ref 8.9–10.3)
Chloride: 102 mmol/L (ref 98–111)
Creatinine, Ser: 1.07 mg/dL (ref 0.61–1.24)
GFR, Estimated: >60 mL/min (ref >60)
Glucose, Bld: 130 mg/dL — ABNORMAL HIGH (ref 70–99)
Potassium: 3.9 mmol/L (ref 3.5–5.1)
Sodium: 135 mmol/L (ref 135–145)

## 2023-02-01 LAB — HIV ANTIBODY (ROUTINE TESTING W REFLEX): HIV Screen 4th Generation wRfx: NONREACTIVE

## 2023-02-01 MED ORDER — OXYCODONE HCL 5 MG PO TABS: 5.0000 mg | ORAL_TABLET | ORAL | Status: DC | PRN

## 2023-02-01 MED ORDER — ENOXAPARIN SODIUM 40 MG/0.4ML IJ SOSY
40.0000 mg | PREFILLED_SYRINGE | INTRAMUSCULAR | Status: DC
  Administered 2023-02-01: 40 mg via SUBCUTANEOUS
  Filled 2023-02-01: qty 0.4

## 2023-02-01 MED ORDER — DOCUSATE SODIUM 100 MG PO CAPS
100.0000 mg | ORAL_CAPSULE | Freq: Two times a day (BID) | ORAL | Status: DC
  Administered 2023-02-01 (×2): 100 mg via ORAL
  Filled 2023-02-01 (×2): qty 1

## 2023-02-01 MED ORDER — ONDANSETRON HCL 4 MG/2ML IJ SOLN
4.0000 mg | Freq: Four times a day (QID) | INTRAMUSCULAR | Status: DC | PRN
  Administered 2023-02-01: 4 mg via INTRAVENOUS
  Filled 2023-02-01: qty 2

## 2023-02-01 MED ORDER — KETOROLAC TROMETHAMINE 15 MG/ML IJ SOLN
15.0000 mg | Freq: Three times a day (TID) | INTRAMUSCULAR | Status: DC
  Administered 2023-02-01 (×2): 15 mg via INTRAVENOUS
  Filled 2023-02-01 (×2): qty 1

## 2023-02-01 MED ORDER — MORPHINE SULFATE (PF) 4 MG/ML IV SOLN
4.0000 mg | Freq: Once | INTRAVENOUS | Status: AC
  Administered 2023-02-01: 4 mg via INTRAVENOUS
  Filled 2023-02-01: qty 1

## 2023-02-01 MED ORDER — OXYCODONE HCL 5 MG PO TABS: 10.0000 mg | ORAL_TABLET | ORAL | Status: DC | PRN

## 2023-02-01 MED ORDER — HYDROMORPHONE HCL 1 MG/ML IJ SOLN
0.5000 mg | INTRAMUSCULAR | Status: DC | PRN
  Administered 2023-02-01: 0.5 mg via INTRAVENOUS
  Filled 2023-02-01: qty 0.5

## 2023-02-01 MED ORDER — METHOCARBAMOL 1000 MG/10ML IJ SOLN: 500.0000 mg | Freq: Four times a day (QID) | INTRAMUSCULAR | Status: DC | PRN

## 2023-02-01 MED ORDER — AMOXICILLIN-POT CLAVULANATE 875-125 MG PO TABS
1.0000 | ORAL_TABLET | Freq: Two times a day (BID) | ORAL | Status: DC
  Administered 2023-02-01: 1 via ORAL
  Filled 2023-02-01: qty 1

## 2023-02-01 MED ORDER — ACETAMINOPHEN 325 MG PO TABS
650.0000 mg | ORAL_TABLET | Freq: Four times a day (QID) | ORAL | Status: DC
  Administered 2023-02-01: 650 mg via ORAL
  Filled 2023-02-01: qty 2

## 2023-02-01 MED ORDER — TRAMADOL HCL 50 MG PO TABS: 50.0000 mg | ORAL_TABLET | Freq: Four times a day (QID) | ORAL | 0 refills | Status: DC | PRN

## 2023-02-01 MED ORDER — PROCHLORPERAZINE EDISYLATE 10 MG/2ML IJ SOLN: 10.0000 mg | INTRAMUSCULAR | Status: DC | PRN

## 2023-02-01 MED ORDER — LACTATED RINGERS IV SOLN: INTRAVENOUS | Status: DC

## 2023-02-01 MED ORDER — SIMETHICONE 80 MG PO CHEW: 80.0000 mg | CHEWABLE_TABLET | Freq: Four times a day (QID) | ORAL | Status: DC | PRN

## 2023-02-01 NOTE — Progress Notes (Signed)
General Surgery was call to inform them that patient was on unit. Call unsuccessful due to no answer. Charge nurse made aware.

## 2023-02-01 NOTE — Progress Notes (Addendum)
Patient admitted to room 6N13. Patient is alert and oriented x 4. Patient is ambulatory. Patient skin assess and is intact and redness to his abdomen umbilical area with a noted lump in that area also.  Patient oriented to unit, room and staff.

## 2023-02-01 NOTE — H&P (Signed)
Admitting Physician: Joseph Vazquez  Service: General Surgery  CC: Abdominal pain  Subjective   HPI: Joseph Vazquez is an 32 y.o. male who is here for painful hernia.  He had an open primary repair using one Ethibond suture of a 3mm incarcerated, severely inflamed with purulent drainage tiny umbilical hernia on 08/07/2018.  He presents today with very similar issues.  He has a painful lump near his belly button with overlying erythema and skin changes.   Past Medical History:  Diagnosis Date   Back pain     Past Surgical History:  Procedure Laterality Date   HERNIA REPAIR     VENTRAL HERNIA REPAIR N/A 08/07/2018   Procedure: HERNIA REPAIR umblical;  Surgeon: Joseph Bue, MD;  Location: [facility];  Service: General;  Laterality: N/A;    Family History  Problem Relation Age of Onset   Cancer Mother    Healthy Father     Social:  reports that he has quit smoking. His smoking use included cigarettes. He uses smokeless tobacco. He reports current alcohol use. He reports that he does not currently use drugs after having used the following drugs: Marijuana.  Allergies: No Known Allergies  Medications: Current Outpatient Medications  Medication Instructions   buPROPion (WELLBUTRIN XL) 300 mg, Oral, Every morning   gabapentin (NEURONTIN) 600 mg, Oral, 3 times daily   glycopyrrolate (ROBINUL) 2 mg, Oral, 2 times daily   testosterone cypionate (DEPOTESTOSTERONE CYPIONATE) 200 mg, Subcutaneous, Every 14 days    ROS - all of the below systems have been reviewed with the patient and positives are indicated with bold text General: chills, fever or night sweats Eyes: blurry vision or double vision ENT: epistaxis or sore throat Allergy/Immunology: itchy/watery eyes or nasal congestion Hematologic/Lymphatic: bleeding problems, blood clots or swollen lymph nodes Endocrine: temperature intolerance or unexpected weight changes Breast: new or changing breast lumps or nipple  discharge Resp: cough, shortness of breath, or wheezing CV: chest pain or dyspnea on exertion GI: as per HPI GU: dysuria, trouble voiding, or hematuria MSK: joint pain or joint stiffness Neuro: TIA or stroke symptoms Derm: pruritus and skin lesion changes Psych: anxiety and depression  Objective   PE Blood pressure 127/76, pulse 86, temperature 98 F (36.7 C), temperature source Oral, resp. rate 16, weight 124.7 kg, SpO2 93%. Constitutional: NAD; conversant; no deformities Eyes: Moist conjunctiva; no lid lag; anicteric; PERRL Neck: Trachea midline; no thyromegaly Lungs: Normal respiratory effort; no tactile fremitus CV: RRR; no palpable thrills; no pitting edema GI: Abd tender periumbilical hernia with overlying erythema; no palpable hepatosplenomegaly MSK: Normal range of motion of extremities; no clubbing/cyanosis Psychiatric: Appropriate affect; alert and oriented x3 Lymphatic: No palpable cervical or axillary lymphadenopathy  Results for orders placed or performed during the hospital encounter of 01/31/23 (from the past 24 hours)  Comprehensive metabolic panel     Status: Abnormal   Collection Time: 01/31/23  9:41 PM  Result Value Ref Range   Sodium 137 135 - 145 mmol/L   Potassium 4.0 3.5 - 5.1 mmol/L   Chloride 102 98 - 111 mmol/L   CO2 28 22 - 32 mmol/L   Glucose, Bld 90 70 - 99 mg/dL   BUN 10 6 - 20 mg/dL   Creatinine, Ser 7.82 0.61 - 1.24 mg/dL   Calcium 8.8 (L) 8.9 - 10.3 mg/dL   Total Protein 7.2 6.5 - 8.1 g/dL   Albumin 4.3 3.5 - 5.0 g/dL   AST 18 15 - 41 U/L  ALT 28 0 - 44 U/L   Alkaline Phosphatase 79 38 - 126 U/L   Total Bilirubin 0.6 <1.2 mg/dL   GFR, Estimated >40 >98 mL/min   Anion gap 7 5 - 15  Lipase, blood     Status: None   Collection Time: 01/31/23  9:41 PM  Result Value Ref Range   Lipase 26 11 - 51 U/L  CBC with Differential     Status: Abnormal   Collection Time: 01/31/23  9:41 PM  Result Value Ref Range   WBC 13.2 (H) 4.0 - 10.5 K/uL    RBC 4.23 4.22 - 5.81 MIL/uL   Hemoglobin 13.3 13.0 - 17.0 g/dL   HCT 11.9 (L) 14.7 - 82.9 %   MCV 90.3 80.0 - 100.0 fL   MCH 31.4 26.0 - 34.0 pg   MCHC 34.8 30.0 - 36.0 g/dL   RDW 56.2 13.0 - 86.5 %   Platelets 324 150 - 400 K/uL   nRBC 0.0 0.0 - 0.2 %   Neutrophils Relative % 64 %   Neutro Abs 8.4 (H) 1.7 - 7.7 K/uL   Lymphocytes Relative 28 %   Lymphs Abs 3.7 0.7 - 4.0 K/uL   Monocytes Relative 7 %   Monocytes Absolute 0.9 0.1 - 1.0 K/uL   Eosinophils Relative 1 %   Eosinophils Absolute 0.1 0.0 - 0.5 K/uL   Basophils Relative 0 %   Basophils Absolute 0.0 0.0 - 0.1 K/uL   Immature Granulocytes 0 %   Abs Immature Granulocytes 0.03 0.00 - 0.07 K/uL  Lactic acid, plasma     Status: None   Collection Time: 01/31/23  9:41 PM  Result Value Ref Range   Lactic Acid, Venous 0.8 0.5 - 1.9 mmol/L     Imaging Orders         CT ABDOMEN PELVIS W CONTRAST     Constellation of findings concerning for strangulated umbilical hernia with a small developing phlegmon/abscess. Clinical correlation recommended.  Assessment and Plan   Joseph Vazquez is an 32 y.o. male with an umbilical hernia with strangulated fatty tissue.  I wonder if he would benefit from just pain control and observation.  This can be a painful process, but hopefully we'll be able to get him through this acute inflammation and plan for a more elective hernia repair in a less inflamed field with better chance at success.  Will follow closely and discuss with the team in the morning.    ICD-10-CM   1. Strangulated umbilical hernia  K42.0        Joseph Ore, MD  [facility] Use [email or web address] to contact on call provider  New Patient Billing: [id number] - High MDM

## 2023-02-01 NOTE — Discharge Instructions (Signed)
  [facility] -- DISCHARGE INSTRUCTIONS  REMINDER:   Carry a list of your medications and allergies with you at all times  Call your pharmacy at least 1 week in advance to refill prescriptions  Do not mix any prescribed pain medicine with alcohol  Do not drive any motor vehicles while taking pain medication  Take medications with food unless otherwise directed  Follow-up appointments (date to return to physician): Please call [phone] to confirm your follow up appointment with your surgeon.  Call your Surgeon if you have:  Temperature greater than 101.0  Persistent nausea and vomiting  Severe uncontrolled pain  Redness, tenderness, or signs of infection (pain, swelling, redness, odor or green/yellow discharge around the site)  Difficulty breathing, headache or visual disturbances  Hives  Persistent dizziness or light-headedness  Any other questions or concerns you may have after discharge  In an emergency, call 911 or go to an Emergency Department at a nearby hospital.  Diet: Begin with liquids, and if they are tolerated, resume your usual diet.  Avoid spicy, greasy or heavy foods.  If you have nausea or vomiting, go back to liquids.  If you cannot keep liquids down, call your doctor.  Avoid alcohol consumption while on prescription pain medications. Good nutrition promotes healing. Increase fiber and fluids.   ADDITIONAL INSTRUCTIONS: Ice pack to umbilicus as needed for comfort.  Avoid lifting > 10lbs until seen back in office by Dr. Dossie Der.  Augmentin BID for one week.  Tramadol as needed for pain.  May shower.  May have regular diet.  [facility] Office: [phone]

## 2023-02-01 NOTE — Plan of Care (Signed)
Pt understanding of discharge instructions  

## 2023-02-01 NOTE — Plan of Care (Signed)

## 2023-02-01 NOTE — Plan of Care (Signed)
  Problem: Clinical Measurements: Goal: Ability to maintain clinical measurements within normal limits will improve Outcome: Progressing Goal: Will remain free from infection Outcome: Progressing Goal: Respiratory complications will improve Outcome: Progressing   Problem: Activity: Goal: Risk for activity intolerance will decrease Outcome: Progressing   Problem: Nutrition: Goal: Adequate nutrition will be maintained Outcome: Progressing   Problem: Coping: Goal: Level of anxiety will decrease Outcome: Progressing   Problem: Elimination: Goal: Will not experience complications related to bowel motility Outcome: Progressing   Problem: Pain Management: Goal: General experience of comfort will improve Outcome: Progressing   Problem: Safety: Goal: Ability to remain free from injury will improve Outcome: Progressing   Problem: Skin Integrity: Goal: Risk for impaired skin integrity will decrease Outcome: Progressing

## 2023-02-01 NOTE — Progress Notes (Signed)
Assessment & Plan: HD#2 - incarcerated (adipose tissue) recurrent umbilical hernia  CT scan reviewed, discussed with Dr. Dossie Der  Will start diet  Oral pain Rx - Tramadol  Oral abx - Augmentin x 1 week  Follow up with Dr. Dossie Der at [facility] in one week to discuss operative repair  Hernia is acutely inflamed with incarcerated adipose tissue (no bowel). Plan non-op management until inflammation resolves.  Likely out-patient surgery with Dr. Dossie Der.        Darnell Level, MD [facility] practice Office: [phone]        Chief Complaint: Incarcerated umbilical hernia, symptomatic  Subjective: Patient in bed, family at bedside.  Objective: Vital signs in last 24 hours: Temp:  [97.8 F (36.6 C)-99 F (37.2 C)] 97.8 F (36.6 C) (12/23 0722) Pulse Rate:  [69-109] 76 (12/23 0722) Resp:  [14-17] 16 (12/23 0722) BP: (108-137)/(65-88) 113/78 (12/23 0722) SpO2:  [92 %-98 %] 98 % (12/23 0722) Weight:  [124.7 kg] 124.7 kg (12/22 1756) Last BM Date : 01/31/23  Intake/Output from previous day: 12/22 0701 - 12/23 0700 In: 60 [P.O.:60] Out: -  Intake/Output this shift: No intake/output data recorded.  Physical Exam: HEENT - sclerae clear, mucous membranes moist Abdomen - area of firmness, tenderness at left superior umbilicus, well-healed surgical scar; otherwise abd is soft and non-tender; no erythema Ext - no edema, non-tender  Lab Results:  Recent Labs    01/31/23 2141 02/01/23 0432  WBC 13.2* 10.8*  HGB 13.3 13.1  HCT 38.2* 38.0*  PLT 324 310   BMET Recent Labs    01/31/23 2141 02/01/23 0432  NA 137 135  K 4.0 3.9  CL 102 102  CO2 28 25  GLUCOSE 90 130*  BUN 10 9  CREATININE 1.03 1.07  CALCIUM 8.8* 8.8*   PT/INR No results for input(s): "LABPROT", "INR" in the last 72 hours. Comprehensive Metabolic Panel:    Component Value Date/Time   NA 135 02/01/2023 0432   NA 137 01/31/2023 2141   K 3.9 02/01/2023 0432   K  4.0 01/31/2023 2141   CL 102 02/01/2023 0432   CL 102 01/31/2023 2141   CO2 25 02/01/2023 0432   CO2 28 01/31/2023 2141   BUN 9 02/01/2023 0432   BUN 10 01/31/2023 2141   CREATININE 1.07 02/01/2023 0432   CREATININE 1.03 01/31/2023 2141   GLUCOSE 130 (H) 02/01/2023 0432   GLUCOSE 90 01/31/2023 2141   CALCIUM 8.8 (L) 02/01/2023 0432   CALCIUM 8.8 (L) 01/31/2023 2141   AST 18 01/31/2023 2141   AST 19 08/17/2021 1444   ALT 28 01/31/2023 2141   ALT 28 08/17/2021 1444   ALKPHOS 79 01/31/2023 2141   ALKPHOS 86 08/17/2021 1444   BILITOT 0.6 01/31/2023 2141   BILITOT 1.0 08/17/2021 1444   PROT 7.2 01/31/2023 2141   PROT 8.3 (H) 08/17/2021 1444   ALBUMIN 4.3 01/31/2023 2141   ALBUMIN 4.7 08/17/2021 1444    Studies/Results: CT ABDOMEN PELVIS W CONTRAST Result Date: 01/31/2023 CLINICAL DATA:  Abdominal wall pain, umbilical hernia, status post repair x2 EXAM: CT ABDOMEN AND PELVIS WITH CONTRAST TECHNIQUE: Multidetector CT imaging of the abdomen and pelvis was performed using the standard protocol following bolus administration of intravenous contrast. RADIATION DOSE REDUCTION: This exam was performed according to the departmental dose-optimization program which includes automated exposure control, adjustment of the mA and/or kV according to patient size and/or use of iterative reconstruction technique. CONTRAST:  OMNIPAQUE IOHEXOL 300  MG/ML  SOLN COMPARISON:  CT abdomen 02/17/2022 FINDINGS: Lower chest: No acute abnormality. Hepatobiliary: Unremarkable liver. Normal gallbladder. No biliary dilation. Pancreas: Unremarkable. Spleen: Unremarkable. Adrenals/Urinary Tract: Normal adrenal glands. No urinary calculi or hydronephrosis. Bladder is unremarkable. Stomach/Bowel: Normal caliber large and small bowel. No bowel wall thickening. The appendix is normal.Stomach is within normal limits. Vascular/Lymphatic: No significant vascular findings are present. No enlarged abdominal or pelvic lymph  nodes. Reproductive: Unremarkable. Other: No free intraperitoneal fluid or air. Peripherally enhancing fluid collection measuring 1.8 x 1.6 cm in the midline ventral abdominal wall at the site of the umbilical hernia. There is inflammatory changes of the herniated fat with soft tissue thickening and adjacent inflammatory fat stranding. Musculoskeletal: No acute fracture. IMPRESSION: Constellation of findings concerning for strangulated umbilical hernia with a small developing phlegmon/abscess. Clinical correlation recommended. Electronically Signed   By: Minerva Fester M.D.   On: 01/31/2023 22:27      Darnell Level 02/01/2023  Patient ID: Joseph Vazquez, male   DOB: 1990-05-13, 32 y.o.   MRN: [id number]

## 2023-02-02 ENCOUNTER — Other Ambulatory Visit: Payer: Self-pay | Admitting: Surgery

## 2023-02-02 ENCOUNTER — Other Ambulatory Visit (HOSPITAL_COMMUNITY): Payer: Self-pay | Admitting: Surgery

## 2023-02-02 ENCOUNTER — Other Ambulatory Visit: Payer: Self-pay | Admitting: Physician Assistant

## 2023-02-02 MED ORDER — AMOXICILLIN-POT CLAVULANATE 875-125 MG PO TABS
1.0000 | ORAL_TABLET | Freq: Two times a day (BID) | ORAL | 0 refills | Status: DC
Start: 2023-02-02 — End: 2023-02-24

## 2023-02-04 NOTE — Discharge Summary (Signed)
    Physician Discharge Summary   Patient ID: Joseph Vazquez MRN: [id number] DOB/AGE: 10-09-1990 32 y.o.  Admit date: 01/31/2023  Discharge date: 02/01/2023  Discharge Diagnoses:  Principal Problem:   Strangulated umbilical hernia   Discharged Condition: good  Hospital Course: Patient was admitted for observation for incarcerated umbilical hernia.  CT scan demonstrated inflammation with incarcerated adipose tissue, no bowel.  After discussion with Dr. Dossie Der, the patient was discharged home on pain medication and antibiotics.  He will follow up at [facility] office.  Consults: None  Treatments: pain Rx, antibiotics  Discharge Exam: Blood pressure 113/78, pulse 76, temperature 97.8 F (36.6 C), temperature source Oral, resp. rate 16, weight 124.7 kg, SpO2 98%. See progress note.  Disposition: Home  Discharge Instructions     Diet - low sodium heart healthy   Complete by: As directed    Ice pack   Complete by: As directed    Increase activity slowly   Complete by: As directed    No dressing needed   Complete by: As directed       Allergies as of 02/01/2023   No Known Allergies      Medication List     TAKE these medications    buPROPion 300 MG 24 hr tablet Commonly known as: WELLBUTRIN XL Take 300 mg by mouth every morning.   glycopyrrolate 2 MG tablet Commonly known as: ROBINUL Take 2 mg by mouth 2 (two) times daily.   testosterone cypionate 200 MG/ML injection Commonly known as: DEPOTESTOSTERONE CYPIONATE Inject 200 mg into the skin every 14 (fourteen) days.   traMADol 50 MG tablet Commonly known as: ULTRAM Take 1-2 tablets (50-100 mg total) by mouth every 6 (six) hours as needed for moderate pain (pain score 4-6).               Discharge Care Instructions  (From admission, onward)           Start     Ordered   02/01/23 0000  No dressing needed        02/01/23 4098            Follow-up Information     Stechschulte, Hyman Hopes, MD.  Schedule an appointment as soon as possible for a visit in 1 week(s).   Specialty: Surgery Why: For wound re-check and to discuss surgery. Contact information: [address] [place] [address] [phone]                 Darnell Level, MD [facility] Office: [phone]   Signed: Darnell Level 02/04/2023, 10:28 AM

## 2023-02-12 ENCOUNTER — Encounter: Payer: Self-pay | Admitting: Diagnostic Neuroimaging

## 2023-02-16 ENCOUNTER — Ambulatory Visit
Admission: RE | Admit: 2023-02-16 | Discharge: 2023-02-16 | Discharge status: Home / Self Care | Payer: Commercial Managed Care - HMO | Source: Ambulatory Visit | Attending: Diagnostic Neuroimaging | Admitting: Diagnostic Neuroimaging

## 2023-02-16 DIAGNOSIS — R569 Unspecified convulsions: Secondary | ICD-10-CM

## 2023-02-16 MED ORDER — GADOPICLENOL 0.5 MMOL/ML IV SOLN
10.0000 mL | Freq: Once | INTRAVENOUS | Status: AC | PRN
  Administered 2023-02-16: 10 mL via INTRAVENOUS

## 2023-02-19 ENCOUNTER — Ambulatory Visit (HOSPITAL_COMMUNITY): Payer: Commercial Managed Care - HMO | Attending: Cardiovascular Disease

## 2023-02-19 DIAGNOSIS — R55 Syncope and collapse: Secondary | ICD-10-CM

## 2023-02-19 LAB — ECHOCARDIOGRAM COMPLETE
Area-P 1/2: 3.65 cm2
S' Lateral: 3.2 cm

## 2023-02-22 ENCOUNTER — Encounter (HOSPITAL_COMMUNITY): Payer: Self-pay

## 2023-02-22 ENCOUNTER — Ambulatory Visit: Payer: Self-pay | Admitting: General Surgery

## 2023-02-22 ENCOUNTER — Other Ambulatory Visit: Payer: Self-pay

## 2023-02-22 ENCOUNTER — Encounter (HOSPITAL_COMMUNITY)
Admission: RE | Admit: 2023-02-22 | Discharge: 2023-02-22 | Disposition: A | Discharge status: Home / Self Care | Payer: Commercial Managed Care - HMO | Source: Ambulatory Visit | Attending: General Surgery | Admitting: General Surgery

## 2023-02-22 DIAGNOSIS — F32A Depression, unspecified: Secondary | ICD-10-CM | POA: Diagnosis not present

## 2023-02-22 DIAGNOSIS — Z87891 Personal history of nicotine dependence: Secondary | ICD-10-CM | POA: Diagnosis not present

## 2023-02-22 DIAGNOSIS — F419 Anxiety disorder, unspecified: Secondary | ICD-10-CM | POA: Diagnosis not present

## 2023-02-22 DIAGNOSIS — Z01812 Encounter for preprocedural laboratory examination: Secondary | ICD-10-CM | POA: Diagnosis present

## 2023-02-22 HISTORY — DX: Depression, unspecified: F32.A

## 2023-02-22 HISTORY — DX: Unspecified convulsions: R56.9

## 2023-02-22 HISTORY — DX: Headache, unspecified: R51.9

## 2023-02-22 HISTORY — DX: Anxiety disorder, unspecified: F41.9

## 2023-02-22 NOTE — Patient Instructions (Addendum)
 SURGICAL WAITING ROOM VISITATION  Patients having surgery or a procedure may have no more than 2 support people in the waiting area - these visitors may rotate.    Children under the age of 20 must have an adult with them who is not the patient.  Due to an increase in RSV and influenza rates and associated hospitalizations, children ages 29 and under may not visit patients in [facility] hospitals.  If the patient needs to stay at the hospital during part of their recovery, the visitor guidelines for inpatient rooms apply. Pre-op nurse will coordinate an appropriate time for 1 support person to accompany patient in pre-op.  This support person may not rotate.    Please refer to the [facility] website for the visitor guidelines for Inpatients (after your surgery is over and you are in a regular room).    Your procedure is scheduled on: 02/26/23   Report to [facility] Main Entrance    Report to admitting at 11:45 AM   Call this number if you have problems the morning of surgery [phone]   Do not eat food :After Midnight.   After Midnight you may have the following liquids until 11:00 AM DAY OF SURGERY  Water  Non-Citrus Juices (without pulp, NO RED-Apple, White grape, White cranberry) Black Coffee (NO MILK/CREAM OR CREAMERS, sugar ok)  Clear Tea (NO MILK/CREAM OR CREAMERS, sugar ok) regular and decaf                             Plain Jell-O (NO RED)                                           Fruit ices (not with fruit pulp, NO RED)                                     Popsicles (NO RED)                                                               Sports drinks like Gatorade (NO RED)          If you have questions, please contact your surgeon's office.   FOLLOW BOWEL PREP AND ANY ADDITIONAL PRE OP INSTRUCTIONS YOU RECEIVED FROM YOUR SURGEON'S OFFICE!!!     Oral Hygiene is also important to reduce your risk of infection.                                    Remember -  BRUSH YOUR TEETH THE MORNING OF SURGERY WITH YOUR REGULAR TOOTHPASTE  DENTURES WILL BE REMOVED PRIOR TO SURGERY PLEASE DO NOT APPLY Poly grip OR ADHESIVES!!!   Stop all vitamins and herbal supplements 7 days before surgery.   Take these medicines the morning of surgery with A SIP OF WATER : Bupropion, Gabapentin   You may not have any metal on your body including jewelry, and body piercing             Do not wear lotions, powders, cologne, or deodorant              Men may shave face and neck.   Do not bring valuables to the hospital. [place] IS NOT             RESPONSIBLE   FOR VALUABLES.   Contacts, glasses, dentures or bridgework may not be worn into surgery.  DO NOT BRING YOUR HOME MEDICATIONS TO THE HOSPITAL. PHARMACY WILL DISPENSE MEDICATIONS LISTED ON YOUR MEDICATION LIST TO YOU DURING YOUR ADMISSION IN THE HOSPITAL!    Patients discharged on the day of surgery will not be allowed to drive home.  Someone NEEDS to stay with you for the first 24 hours after anesthesia.              Please read over the following fact sheets you were given: IF YOU HAVE QUESTIONS ABOUT YOUR PRE-OP INSTRUCTIONS PLEASE CALL [phone] [place]    If you received a COVID test during your pre-op visit  it is requested that you wear a mask when out in public, stay away from anyone that may not be feeling well and notify your surgeon if you develop symptoms. If you test positive for Covid or have been in contact with anyone that has tested positive in the last 10 days please notify you surgeon.    [place] - Preparing for Surgery Before surgery, you can play an important role.  Because skin is not sterile, your skin needs to be as free of germs as possible.  You can reduce the number of germs on your skin by washing with CHG (chlorahexidine gluconate) soap before surgery.  CHG is an antiseptic cleaner which kills germs and bonds with the skin to continue killing germs  even after washing. Please DO NOT use if you have an allergy to CHG or antibacterial soaps.  If your skin becomes reddened/irritated stop using the CHG and inform your nurse when you arrive at Short Stay. Do not shave (including legs and underarms) for at least 48 hours prior to the first CHG shower.  You may shave your face/neck.  Please follow these instructions carefully:  1.  Shower with CHG Soap the night before surgery and the  morning of surgery.  2.  If you choose to wash your hair, wash your hair first as usual with your normal  shampoo.  3.  After you shampoo, rinse your hair and body thoroughly to remove the shampoo.                             4.  Use CHG as you would any other liquid soap.  You can apply chg directly to the skin and wash.  Gently with a scrungie or clean washcloth.  5.  Apply the CHG Soap to your body ONLY FROM THE NECK DOWN.   Do   not use on face/ open                           Wound or open sores. Avoid contact with eyes, ears mouth and   genitals (private parts).  Wash face,  Genitals (private parts) with your normal soap.             6.  Wash thoroughly, paying special attention to the area where your    surgery  will be performed.  7.  Thoroughly rinse your body with warm water  from the neck down.  8.  DO NOT shower/wash with your normal soap after using and rinsing off the CHG Soap.                9.  Pat yourself dry with a clean towel.            10.  Wear clean pajamas.            11.  Place clean sheets on your bed the night of your first shower and do not  sleep with pets. Day of Surgery : Do not apply any lotions/deodorants the morning of surgery.  Please wear clean clothes to the hospital/surgery center.  FAILURE TO FOLLOW THESE INSTRUCTIONS MAY RESULT IN THE CANCELLATION OF YOUR SURGERY  PATIENT SIGNATURE_________________________________  NURSE  SIGNATURE__________________________________  ________________________________________________________________________

## 2023-02-22 NOTE — Progress Notes (Signed)
Please place orders for PAT appointment.  

## 2023-02-22 NOTE — Progress Notes (Addendum)
 Patient states body rejected mesh  COVID Vaccine Completed: no  Date of COVID positive in last 90 days: no  PCP - Lamarr Rotunda, MD Cardiologist - Darryle Decent, MD [facility] 01/14/23  Neurologist- Eduard Hanlon, MD  Chest x-ray - 01/12/23 Epic EKG - 01/15/23 Epic Stress Test - n/a ECHO - 02/19/23 Epic Cardiac Cath - n/a Pacemaker/ICD device last checked: n/a Spinal Cord Stimulator: n/a  Bowel Prep - no  Sleep Study - n/a CPAP -   Fasting Blood Sugar - n/a Checks Blood Sugar _____ times a day  Last dose of GLP1 agonist-  N/A GLP1 instructions:  Hold 7 days before surgery    Last dose of SGLT-2 inhibitors-  N/A SGLT-2 instructions:  Hold 3 days before surgery    Blood Thinner Instructions: n/a Aspirin Instructions: Last Dose:  Activity level: Can go up a flight of stairs and perform activities of daily living without stopping and without symptoms of chest pain or shortness of breath.   Anesthesia review: syncope and collapse, new onset seizure in Dec 2024  Patient denies shortness of breath, fever, cough and chest pain at PAT appointment  Patient verbalized understanding of instructions that were given to them at the PAT appointment. Patient was also instructed that they will need to review over the PAT instructions again at home before surgery.

## 2023-02-23 NOTE — Anesthesia Preprocedure Evaluation (Addendum)
 Anesthesia Evaluation  Patient identified by MRN, date of birth, ID band Patient awake    Reviewed: Allergy & Precautions, NPO status , Patient's Chart, lab work & pertinent test results, reviewed documented beta blocker date and time   History of Anesthesia Complications Negative for: history of anesthetic complications  Airway Mallampati: III  TM Distance: >3 FB Neck ROM: Full    Dental no notable dental hx.    Pulmonary neg COPD, former smoker, neg PE   breath sounds clear to auscultation       Cardiovascular (-) hypertension(-) angina (-) CAD and (-) Past MI  Rhythm:Regular Rate:Normal     Neuro/Psych  Headaches, Seizures -, Poorly Controlled,  PSYCHIATRIC DISORDERS Anxiety Depression     Neuromuscular disease    GI/Hepatic ,GERD  ,,(+) neg Cirrhosis        Endo/Other    Renal/GU Renal disease     Musculoskeletal  (+) Arthritis ,    Abdominal   Peds  Hematology   Anesthesia Other Findings   Reproductive/Obstetrics                             Anesthesia Physical Anesthesia Plan  ASA: 2  Anesthesia Plan: General   Post-op Pain Management: Regional block*   Induction: Intravenous  PONV Risk Score and Plan: 2 and Ondansetron   Airway Management Planned: Oral ETT  Additional Equipment:   Intra-op Plan:   Post-operative Plan: Extubation in OR  Informed Consent: I have reviewed the patients History and Physical, chart, labs and discussed the procedure including the risks, benefits and alternatives for the proposed anesthesia with the patient or authorized representative who has indicated his/her understanding and acceptance.     Dental advisory given  Plan Discussed with: CRNA  Anesthesia Plan Comments: (See PAT note from 1/13 by MARLA Senna PA-C )        Anesthesia Quick Evaluation

## 2023-02-23 NOTE — Progress Notes (Signed)
 DISCUSSION: Joseph Vazquez is a 33 yo male who presents to PAT prior to ROBOTIC INCISIONAL HERNIA REPAIR WITH MESH on 02/26/23 with Dr. Polly for incarcerated umbilical hernia. PMH of former smoking, migraines, depression, anxiety, recent seizure vs syncope.  Patient was seen in he ED on 01/12/23 for syncope vs seizure. He followed up with Cardiology on 01/14/23 and underwent echo and holter monitoring which came back unremarkable. He was referred to Neurology to eval for seizures and had MRI of the brain which was normal. EEG ordered but not yet completed. Not started on antiepileptic therapy. Advised f/u in 6 months.   He was admitted from 12/22-12/23 for umbilical hernia with strangulated fatty tissue. He was observed and started on antibiotics. Scheduled for repair once inflammation has improved.  VS: BP (!) 158/95   Pulse 91   Temp 37.3 C (Oral)   Resp 18   Ht 6' (1.829 m)   Wt 124.3 kg   SpO2 97%   BMI 37.16 kg/m   PROVIDERS: Chrystal Lamarr RAMAN, MD Neurology: Eduard Leech, MD Cardiology: Darryle MALVA Mulch, MD  LABS: Labs reviewed: Acceptable for surgery. (all labs ordered are listed, but only abnormal results are displayed)  Labs Reviewed - No data to display   IMAGES: MRI Brain 02/16/23:   FINDINGS:  The brain parenchyma shows no abnormal signal intensities.  No structural lesion, tumor or infarct is noted.  Diffusion-weighted imaging negative for acute ischemia.  Subarachnoid spaces and ventricular system appear normal.  Cortical sulci and gyri show normal appearance.  Extra-axial brain structures appear normal.  Calvarium shows no abnormalities.  Orbits appear unremarkable.  Paranasal sinuses show mild chronic inflammatory changes.  The pituitary gland and cerebellar tonsils appear normal.  Visualized portion of the cervical spine shows no significant abnormalities.  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  Postcontrast images do not result in abnormal areas of enhancement.  No structural lesion tumor cyst or sclerosis noted in the hippocampus and medial temporal lobe.    IMPRESSION: Unremarkable MRI scan of the brain with and without contrast.     EKG 01/12/23:  SR, rate 97   CV:  Echo 02/19/23:  IMPRESSIONS    1. Left ventricular ejection fraction, by estimation, is 60 to 65%. The left ventricle has normal function. The left ventricle has no regional wall motion abnormalities. Left ventricular diastolic parameters were normal. The average left ventricular global longitudinal strain is -21.6 %. The global longitudinal strain is normal.  2. Right ventricular systolic function is normal. The right ventricular size is normal. There is normal pulmonary artery systolic pressure.  3. The mitral valve is normal in structure. No evidence of mitral valve regurgitation. No evidence of mitral stenosis.  4. The aortic valve is tricuspid. Aortic valve regurgitation is not visualized. No aortic stenosis is present.  5. The inferior vena cava is normal in size with greater than 50% respiratory variability, suggesting right atrial pressure of 3 mmHg.  Comparison(s): No prior Echocardiogram.  Conclusion(s)/Recommendation(s): Normal biventricular function without evidence of hemodynamically significant valvular heart disease.      Past Medical History:  Diagnosis Date   Anxiety    Back pain    Depression    Headache    mirgrianes   Seizure [facility])     Past Surgical History:  Procedure Laterality Date   HERNIA REPAIR     VENTRAL HERNIA REPAIR N/A 08/07/2018  Procedure: HERNIA REPAIR umblical;  Surgeon: Signe Mitzie LABOR, MD;  Location: [facility];  Service: General;  Laterality: N/A;    MEDICATIONS:  amoxicillin -clavulanate (AUGMENTIN ) 875-125 MG tablet   buPROPion (WELLBUTRIN XL) 300 MG 24 hr tablet   gabapentin  (NEURONTIN ) 300 MG  capsule   glycopyrrolate (ROBINUL) 2 MG tablet   testosterone cypionate (DEPOTESTOSTERONE CYPIONATE) 200 MG/ML injection   traMADol  (ULTRAM ) 50 MG tablet   No current facility-administered medications for this encounter.   Burnard CHRISTELLA Odis DEVONNA [facility] Short Stay/Anesthesiology [facility] Phone [phone] 02/23/2023 9:07 AM

## 2023-02-25 ENCOUNTER — Encounter (HOSPITAL_COMMUNITY): Payer: Self-pay | Admitting: General Surgery

## 2023-02-26 ENCOUNTER — Encounter (HOSPITAL_COMMUNITY)
Admission: RE | Disposition: A | Discharge status: Home / Self Care | Payer: Self-pay | Source: Ambulatory Visit | Attending: General Surgery

## 2023-02-26 ENCOUNTER — Other Ambulatory Visit: Payer: Self-pay

## 2023-02-26 ENCOUNTER — Encounter (HOSPITAL_COMMUNITY): Payer: Self-pay | Admitting: General Surgery

## 2023-02-26 ENCOUNTER — Ambulatory Visit (HOSPITAL_COMMUNITY)
Admission: RE | Admit: 2023-02-26 | Discharge: 2023-02-26 | Disposition: A | Discharge status: Home / Self Care | Payer: BC Managed Care – PPO | Source: Ambulatory Visit | Attending: General Surgery | Admitting: General Surgery

## 2023-02-26 ENCOUNTER — Ambulatory Visit (HOSPITAL_COMMUNITY): Payer: BC Managed Care – PPO | Admitting: Medical

## 2023-02-26 ENCOUNTER — Ambulatory Visit (HOSPITAL_COMMUNITY): Payer: BC Managed Care – PPO | Admitting: Certified Registered"

## 2023-02-26 DIAGNOSIS — K429 Umbilical hernia without obstruction or gangrene: Secondary | ICD-10-CM | POA: Insufficient documentation

## 2023-02-26 DIAGNOSIS — Z79899 Other long term (current) drug therapy: Secondary | ICD-10-CM | POA: Diagnosis not present

## 2023-02-26 DIAGNOSIS — F419 Anxiety disorder, unspecified: Secondary | ICD-10-CM | POA: Insufficient documentation

## 2023-02-26 DIAGNOSIS — F32A Depression, unspecified: Secondary | ICD-10-CM | POA: Diagnosis not present

## 2023-02-26 DIAGNOSIS — Z9889 Other specified postprocedural states: Secondary | ICD-10-CM | POA: Diagnosis not present

## 2023-02-26 DIAGNOSIS — N289 Disorder of kidney and ureter, unspecified: Secondary | ICD-10-CM | POA: Diagnosis not present

## 2023-02-26 DIAGNOSIS — R569 Unspecified convulsions: Secondary | ICD-10-CM | POA: Diagnosis not present

## 2023-02-26 DIAGNOSIS — K219 Gastro-esophageal reflux disease without esophagitis: Secondary | ICD-10-CM | POA: Diagnosis not present

## 2023-02-26 DIAGNOSIS — Z87891 Personal history of nicotine dependence: Secondary | ICD-10-CM | POA: Insufficient documentation

## 2023-02-26 DIAGNOSIS — M1990 Unspecified osteoarthritis, unspecified site: Secondary | ICD-10-CM | POA: Diagnosis not present

## 2023-02-26 DIAGNOSIS — R519 Headache, unspecified: Secondary | ICD-10-CM | POA: Diagnosis not present

## 2023-02-26 HISTORY — PX: XI ROBOTIC ASSISTED VENTRAL HERNIA: SHX6789

## 2023-02-26 SURGERY — REPAIR, HERNIA, VENTRAL, ROBOT-ASSISTED
Anesthesia: General

## 2023-02-26 MED ORDER — ACETAMINOPHEN 10 MG/ML IV SOLN: 1000.0000 mg | Freq: Once | INTRAVENOUS | Status: DC | PRN

## 2023-02-26 MED ORDER — OXYCODONE HCL 5 MG PO TABS
ORAL_TABLET | ORAL | Status: AC
  Administered 2023-02-26: 5 mg via ORAL
  Filled 2023-02-26: qty 1

## 2023-02-26 MED ORDER — MIDAZOLAM HCL 2 MG/2ML IJ SOLN
INTRAMUSCULAR | Status: AC
Start: 2023-02-26 — End: ?
  Filled 2023-02-26: qty 2

## 2023-02-26 MED ORDER — SODIUM CHLORIDE 0.9 % IV SOLN
250.0000 mL | INTRAVENOUS | Status: DC | PRN
Start: 2023-02-26 — End: 2023-02-26

## 2023-02-26 MED ORDER — ROCURONIUM BROMIDE 10 MG/ML (PF) SYRINGE
PREFILLED_SYRINGE | INTRAVENOUS | Status: AC
  Filled 2023-02-26: qty 10

## 2023-02-26 MED ORDER — SODIUM CHLORIDE 0.9% FLUSH: 3.0000 mL | Freq: Two times a day (BID) | INTRAVENOUS | Status: DC

## 2023-02-26 MED ORDER — FENTANYL CITRATE (PF) 100 MCG/2ML IJ SOLN
INTRAMUSCULAR | Status: AC
  Filled 2023-02-26: qty 2

## 2023-02-26 MED ORDER — SODIUM CHLORIDE 0.9% FLUSH: 3.0000 mL | INTRAVENOUS | Status: DC | PRN

## 2023-02-26 MED ORDER — DEXAMETHASONE SODIUM PHOSPHATE 10 MG/ML IJ SOLN
INTRAMUSCULAR | Status: DC | PRN
  Administered 2023-02-26: 10 mg via INTRAVENOUS

## 2023-02-26 MED ORDER — LACTATED RINGERS IV SOLN: INTRAVENOUS | Status: DC

## 2023-02-26 MED ORDER — CELECOXIB 200 MG PO CAPS
200.0000 mg | ORAL_CAPSULE | ORAL | Status: AC
  Administered 2023-02-26: 200 mg via ORAL
  Filled 2023-02-26: qty 1

## 2023-02-26 MED ORDER — ONDANSETRON HCL 4 MG/2ML IJ SOLN
INTRAMUSCULAR | Status: DC | PRN
  Administered 2023-02-26: 4 mg via INTRAVENOUS

## 2023-02-26 MED ORDER — OXYCODONE HCL 5 MG PO TABS: 5.0000 mg | ORAL_TABLET | Freq: Once | ORAL | Status: AC | PRN

## 2023-02-26 MED ORDER — FENTANYL CITRATE PF 50 MCG/ML IJ SOSY
25.0000 ug | PREFILLED_SYRINGE | INTRAMUSCULAR | Status: DC | PRN
  Administered 2023-02-26: 25 ug via INTRAVENOUS
  Administered 2023-02-26: 50 ug via INTRAVENOUS

## 2023-02-26 MED ORDER — ONDANSETRON HCL 4 MG/2ML IJ SOLN
4.0000 mg | Freq: Once | INTRAMUSCULAR | Status: AC | PRN
  Administered 2023-02-26: 4 mg via INTRAVENOUS

## 2023-02-26 MED ORDER — CHLORHEXIDINE GLUCONATE CLOTH 2 % EX PADS: 6.0000 | MEDICATED_PAD | Freq: Once | CUTANEOUS | Status: DC

## 2023-02-26 MED ORDER — FENTANYL CITRATE (PF) 100 MCG/2ML IJ SOLN
INTRAMUSCULAR | Status: DC | PRN
  Administered 2023-02-26: 25 ug via INTRAVENOUS
  Administered 2023-02-26: 100 ug via INTRAVENOUS
  Administered 2023-02-26: 50 ug via INTRAVENOUS
  Administered 2023-02-26: 25 ug via INTRAVENOUS

## 2023-02-26 MED ORDER — ACETAMINOPHEN 325 MG PO TABS: 650.0000 mg | ORAL_TABLET | Freq: Four times a day (QID) | ORAL | 0 refills | Status: AC

## 2023-02-26 MED ORDER — SUGAMMADEX SODIUM 200 MG/2ML IV SOLN
INTRAVENOUS | Status: DC | PRN
  Administered 2023-02-26: 400 mg via INTRAVENOUS

## 2023-02-26 MED ORDER — MIDAZOLAM HCL 2 MG/2ML IJ SOLN
INTRAMUSCULAR | Status: DC | PRN
  Administered 2023-02-26: 2 mg via INTRAVENOUS

## 2023-02-26 MED ORDER — BUPIVACAINE-EPINEPHRINE (PF) 0.5% -1:200000 IJ SOLN
INTRAMUSCULAR | Status: AC
  Filled 2023-02-26: qty 30

## 2023-02-26 MED ORDER — FENTANYL CITRATE PF 50 MCG/ML IJ SOSY
PREFILLED_SYRINGE | INTRAMUSCULAR | Status: AC
  Filled 2023-02-26: qty 1

## 2023-02-26 MED ORDER — STERILE WATER FOR IRRIGATION IR SOLN
Status: DC | PRN
  Administered 2023-02-26: 1000 mL

## 2023-02-26 MED ORDER — IBUPROFEN 200 MG PO TABS: 600.0000 mg | ORAL_TABLET | Freq: Four times a day (QID) | ORAL | 0 refills | Status: AC

## 2023-02-26 MED ORDER — OXYCODONE HCL 5 MG PO TABS: 5.0000 mg | ORAL_TABLET | Freq: Three times a day (TID) | ORAL | 0 refills | Status: AC | PRN

## 2023-02-26 MED ORDER — ONDANSETRON HCL 4 MG/2ML IJ SOLN
INTRAMUSCULAR | Status: AC
  Filled 2023-02-26: qty 2

## 2023-02-26 MED ORDER — DEXMEDETOMIDINE HCL IN NACL 80 MCG/20ML IV SOLN
INTRAVENOUS | Status: DC | PRN
  Administered 2023-02-26 (×2): 4 ug via INTRAVENOUS
  Administered 2023-02-26: 8 ug via INTRAVENOUS
  Administered 2023-02-26: 4 ug via INTRAVENOUS

## 2023-02-26 MED ORDER — LIDOCAINE 2% (20 MG/ML) 5 ML SYRINGE
INTRAMUSCULAR | Status: DC | PRN
  Administered 2023-02-26: 20 mg via INTRAVENOUS

## 2023-02-26 MED ORDER — GABAPENTIN 300 MG PO CAPS
300.0000 mg | ORAL_CAPSULE | ORAL | Status: AC
  Administered 2023-02-26: 300 mg via ORAL
  Filled 2023-02-26: qty 1

## 2023-02-26 MED ORDER — SODIUM CHLORIDE 0.9 % IV SOLN
3.0000 g | INTRAVENOUS | Status: AC
  Administered 2023-02-26: 3 g via INTRAVENOUS
  Filled 2023-02-26: qty 3

## 2023-02-26 MED ORDER — OXYCODONE HCL 5 MG/5ML PO SOLN: 5.0000 mg | Freq: Once | ORAL | Status: AC | PRN

## 2023-02-26 MED ORDER — CHLORHEXIDINE GLUCONATE 0.12 % MT SOLN
15.0000 mL | Freq: Once | OROMUCOSAL | Status: AC
  Administered 2023-02-26: 15 mL via OROMUCOSAL

## 2023-02-26 MED ORDER — ROCURONIUM BROMIDE 10 MG/ML (PF) SYRINGE
PREFILLED_SYRINGE | INTRAVENOUS | Status: DC | PRN
  Administered 2023-02-26: 20 mg via INTRAVENOUS
  Administered 2023-02-26: 100 mg via INTRAVENOUS

## 2023-02-26 MED ORDER — DEXMEDETOMIDINE HCL IN NACL 80 MCG/20ML IV SOLN
INTRAVENOUS | Status: AC
  Filled 2023-02-26: qty 20

## 2023-02-26 MED ORDER — BUPIVACAINE-EPINEPHRINE (PF) 0.5% -1:200000 IJ SOLN
INTRAMUSCULAR | Status: DC | PRN
  Administered 2023-02-26: 20 mL

## 2023-02-26 MED ORDER — PROPOFOL 10 MG/ML IV BOLUS
INTRAVENOUS | Status: DC | PRN
  Administered 2023-02-26: 200 mg via INTRAVENOUS

## 2023-02-26 MED ORDER — ROPIVACAINE HCL 5 MG/ML IJ SOLN
INTRAMUSCULAR | Status: DC | PRN
  Administered 2023-02-26: 30 mL via PERINEURAL

## 2023-02-26 MED ORDER — DEXAMETHASONE SODIUM PHOSPHATE 10 MG/ML IJ SOLN
INTRAMUSCULAR | Status: AC
  Filled 2023-02-26: qty 1

## 2023-02-26 MED ORDER — BUPIVACAINE LIPOSOME 1.3 % IJ SUSP
INTRAMUSCULAR | Status: AC
  Filled 2023-02-26: qty 20

## 2023-02-26 MED ORDER — BUPIVACAINE LIPOSOME 1.3 % IJ SUSP: 20.0000 mL | Freq: Once | INTRAMUSCULAR | Status: DC

## 2023-02-26 MED ORDER — ACETAMINOPHEN 500 MG PO TABS
1000.0000 mg | ORAL_TABLET | ORAL | Status: AC
  Administered 2023-02-26: 1000 mg via ORAL
  Filled 2023-02-26: qty 2

## 2023-02-26 MED ORDER — ORAL CARE MOUTH RINSE: 15.0000 mL | Freq: Once | OROMUCOSAL | Status: AC

## 2023-02-26 MED ORDER — 0.9 % SODIUM CHLORIDE (POUR BTL) OPTIME
TOPICAL | Status: DC | PRN
  Administered 2023-02-26: 1000 mL

## 2023-02-26 MED ORDER — FENTANYL CITRATE PF 50 MCG/ML IJ SOSY
PREFILLED_SYRINGE | INTRAMUSCULAR | Status: AC
  Administered 2023-02-26: 25 ug via INTRAVENOUS
  Filled 2023-02-26: qty 1

## 2023-02-26 MED ORDER — CLONIDINE HCL (ANALGESIA) 100 MCG/ML EP SOLN
EPIDURAL | Status: DC | PRN
  Administered 2023-02-26: 100 ug

## 2023-02-26 MED ORDER — ONDANSETRON HCL 4 MG/2ML IJ SOLN
INTRAMUSCULAR | Status: AC
Start: 2023-02-26 — End: ?
  Filled 2023-02-26: qty 2

## 2023-02-26 MED ORDER — FENTANYL CITRATE PF 50 MCG/ML IJ SOSY
PREFILLED_SYRINGE | INTRAMUSCULAR | Status: AC
  Administered 2023-02-26: 50 ug via INTRAVENOUS
  Filled 2023-02-26: qty 1

## 2023-02-26 MED ORDER — PROPOFOL 10 MG/ML IV BOLUS
INTRAVENOUS | Status: AC
  Filled 2023-02-26: qty 20

## 2023-02-26 SURGICAL SUPPLY — 47 items
ANTIFOG SOL W/FOAM PAD STRL (MISCELLANEOUS) ×1 IMPLANT
BLADE SURG SZ11 CARB STEEL (BLADE) ×1 IMPLANT
CHLORAPREP W/TINT 26 (MISCELLANEOUS) ×1 IMPLANT
COVER MAYO STAND STRL (DRAPES) ×1 IMPLANT
COVER SURGICAL LIGHT HANDLE (MISCELLANEOUS) ×1 IMPLANT
COVER TIP SHEARS 8 DVNC (MISCELLANEOUS) ×1 IMPLANT
DERMABOND ADVANCED .7 DNX12 (GAUZE/BANDAGES/DRESSINGS) ×1 IMPLANT
DRAPE ARM DVNC X/XI (DISPOSABLE) ×3 IMPLANT
DRAPE COLUMN DVNC XI (DISPOSABLE) ×1 IMPLANT
DRIVER NDL MEGA SUTCUT DVNCXI (INSTRUMENTS) ×1 IMPLANT
DRIVER NDLE MEGA SUTCUT DVNCXI (INSTRUMENTS) ×1 IMPLANT
ELECT PENCIL ROCKER SW 15FT (MISCELLANEOUS) IMPLANT
ELECT REM PT RETURN 15FT ADLT (MISCELLANEOUS) ×1 IMPLANT
FORCEPS BPLR 8 MD DVNC XI (FORCEP) ×1 IMPLANT
GAUZE 4X4 16PLY ~~LOC~~+RFID DBL (SPONGE) ×1 IMPLANT
GLOVE BIO SURGEON STRL SZ7 (GLOVE) ×2 IMPLANT
GOWN STRL REUS W/ TWL XL LVL3 (GOWN DISPOSABLE) ×2 IMPLANT
KIT BASIN OR (CUSTOM PROCEDURE TRAY) ×1 IMPLANT
KIT TURNOVER KIT A (KITS) IMPLANT
MARKER SKIN DUAL TIP RULER LAB (MISCELLANEOUS) ×1 IMPLANT
MESH PROGRIP LAP SLF FIX 16X12 (Mesh General) IMPLANT
NDL HYPO 22X1.5 SAFETY MO (MISCELLANEOUS) ×1 IMPLANT
NDL INSUFFLATION 14GA 120MM (NEEDLE) ×1 IMPLANT
NEEDLE HYPO 22X1.5 SAFETY MO (MISCELLANEOUS) ×1 IMPLANT
NEEDLE INSUFFLATION 14GA 120MM (NEEDLE) ×1 IMPLANT
OBTURATOR OPTICAL STND 8 DVNC (TROCAR) ×1 IMPLANT
OBTURATOR OPTICALSTD 8 DVNC (TROCAR) ×1 IMPLANT
PACK CARDIOVASCULAR III (CUSTOM PROCEDURE TRAY) ×1 IMPLANT
SCISSORS MNPLR CVD DVNC XI (INSTRUMENTS) ×1 IMPLANT
SEAL UNIV 5-12 XI (MISCELLANEOUS) ×3 IMPLANT
SOL ELECTROSURG ANTI STICK (MISCELLANEOUS) ×1 IMPLANT
SOLUTION ANTFG W/FOAM PAD STRL (MISCELLANEOUS) ×1 IMPLANT
SOLUTION ELECTROSURG ANTI STCK (MISCELLANEOUS) ×1 IMPLANT
SPIKE FLUID TRANSFER (MISCELLANEOUS) ×1 IMPLANT
SUT MNCRL AB 4-0 PS2 18 (SUTURE) ×1 IMPLANT
SUT STRATA PDS 0 15 CT-1.5 (SUTURE) IMPLANT
SUT VIC AB 2-0 SH 27X BRD (SUTURE) ×1 IMPLANT
SUT VIC AB 3-0 SH 18 (SUTURE) IMPLANT
SUT VLOC 180 2-0 9IN GS21 (SUTURE) IMPLANT
SUT VLOC 3-0 9IN GRN (SUTURE) ×1 IMPLANT
SYR 10ML LL (SYRINGE) ×1 IMPLANT
SYR 20ML LL LF (SYRINGE) ×1 IMPLANT
TAPE STRIPS DRAPE STRL (GAUZE/BANDAGES/DRESSINGS) ×1 IMPLANT
TOWEL GREEN STERILE FF (TOWEL DISPOSABLE) ×1 IMPLANT
TOWEL OR 17X26 10 PK STRL BLUE (TOWEL DISPOSABLE) ×1 IMPLANT
TROCAR Z-THREAD OPTICAL 5X100M (TROCAR) IMPLANT
TUBING INSUFFLATION 10FT LAP (TUBING) ×1 IMPLANT

## 2023-02-26 NOTE — Discharge Instructions (Signed)
Home Care After Hernia Repair   Activity  Limit activity for the first 24 hours, then you may return to normal daily activities. Returning to normal daily activities as soon as you can following surgery will enhance recovery time.  No heavy lifting pushing or pulling, anything heavier than 10 pounds (gallon of milk weighs approx. 8.8 pounds) for 4-6 weeks from surgery date.  Do not mow the lawn, use a vacuum cleaner, or do any other strenuous activities without first consulting your surgical team.  Climb stairs slowly and watch your step.  Walk as often as you feel able to increase strength and endurance.  No driving or operating heavy machinery within 24 hours of taking narcotic pain medication.  Diet  Drink plenty of fluids and eat a light meal on the night of surgery. Some patients may find their appetite is poor for a week or two after surgery. This is a normal result of the stress of surgery-your appetite will return in time.   There are no specific diet restrictions after surgery.  Dressings and Wound Care  Dermabond/Durabond (skin glue): This will usually remain in place for 10-14 days, then naturally fall off your skin. You may take a shower 24 hrs after  surgery, carefully wash, not scrub the incision site with a mild non-scented soap. Pat dry with a soft towel.  Do not pick or peel skin glue off.  You can shower and let the water fall on the dressings above. Do not soak or submerge your incision(s) in a bath tub, hot tub, or swimming pool, until your doctor says it is ok to do so or the incision(s) have completely healed, usually about 2-4 weeks.  Do not use creams, powder, salves or balms on your incision(s).  What to Expect After Surgery   Moderate discomfort controlled with medications  Minimal drainage from incision  You may feel pain in one or both shoulders. This pain comes from the gas still left in your belly after the surgery, if you had laparoscopic surgery (several small  incisions). The pain should ease over several days to a week. Ambulation will help with this pain.   Belly swelling  Feeling fatigue and weak  Constipation after surgery is common. Drink plenty fluids and eat a high fiber diet.  Swelling - In some patients might feel that their hernia has returned after surgery-DO NOT Worry this is normal. Swelling may be due to the development of a seroma. Seroma is fluid that has built up where the hernia was repaired this is a normal result after surgery and it will slowly reabsorb back into your body over the next several weeks.   Pain Control: Prescribed Non-Narcotic Pain Medication  You will be given three prescriptions.  Two of them will be for prescription strength ibuprofen (i.e. Advil) and prescription strength acetaminophen (i.e. Tylenol).  The vast majority of patients will just need these two medications.  One prescription will be for a 'rescue' prescription of an oral narcotic (oxycodone).  You may fill this if needed.  You will alternate taking the ibuprofen (600mg ) every 6 hours and also the acetaminophen (650mg ) every 6 hours so that you are taking one of those medications every 3 hours.  For example: o 0800 - take ibuprofen 600mg  o 1100 - take acetaminophen 650mg  o 1400 - take ibuprofen 600mg  o 1700 - take acetaminophen 650mg  o Etc.  Continue taking this alternating pattern of ibuprofen and acetaminophen for 3 days  If you cannot take  one or the other of these medications, just take the one you can every 6 hours.  If you are comfortable at night, you don't have to wake up and take a medication.  If you are still uncomfortable after taking either ibuprofen or acetaminophen, try gentle stretching exercise and ice packs (a bag of frozen vegetables works great).  If you are still uncomfortable, you may fill the narcotic prescription of Oxycodone and take as directed.  Once you have completed these prescriptions, your pain level should be low enough  to stop taking medications altogether or just use an over the counter medication (ibuprofen or acetaminophen) as needed.   Pain Control: Over the Counter Medications to take as needed:  Colace/Docusate: May be prescribed by your surgeon to prevent constipation caused by the combination of narcotics, effects of anesthesia, and decreased ambulation.  Hold for loose stools or diarrhea. Take 100 mg 1-2 times a day starting tonight.   Fiber: High fiber foods, extra liquids (water 9-13 cups/day) can also assist with constipation. Examples of high fiber foods are fruit, bran. Prune juice and water are also good liquids to drink.  Milk of Magnesia/Miralax:  If constipated despite take the over the counter stool softeners, you may take Milk of Magnesia or Miralax as directed on bottle to assist with constipation.     Pepcid/Famotidine: May be prescribed while taking naproxen (Aleve) or other NSAIDs such as ibuprofen (Motrin/Advil) to prevent stomach upset or Acid-reflux symptoms. Take 1 tablet 1-2 times a day.   **Constipation: The first bowel movement may occur anywhere between 1-5 days after surgery.  As long as you are not nauseated or not having significant abdominal pain this variation is acceptable.  Narcotic pain medications can cause constipation increasing discomfort; early discontinuation will assist with bowel management.If constipated despite taking stool softeners, you may take Milk of Magnesia or Miralax as directed on the bottle.     **Home medications: You may restart your home medications as directed by your respective Primary Care Physician or Surgeon.  When to notify your Doctor or Healthcare Team:   Sign of Wound Infection   Fever over 100 degrees.  Wound becomes extremely swollen, shows red streaks, warm to the touch, and/or drainage from the incision site or foul-smelling drainage.  Wound edges separate or opens up  Bleeding or bruising   If you have bleeding, apply pressure to the  site and hold the pressure firmly for 5 minutes. If the bleeding continues, apply pressure again and call 911. If the bleeding stopped, call your doctor to report it.   Call your doctor or nurse if you have increased bleeding from your site and increased bruising or a lump forms or gets larger under your skin at the site.  Unrelieved Pain    Call your doctor or nurse if your pain gets worse or is not eased 1 hour after taking your pain medicine, or if it is severe and uncontrolled.  Nausea and Vomiting   Call your doctor or nurse if you have nausea and vomiting that continues more than 24 hours, will not let you keep medicine down and will not let you keep fluids down  Fever, Flu-like symptoms   Fever over 100 degrees and/or chills  Gastrointestinal Bleeding Symptoms    Black tarry bowel movements.  This can be normal after surgery on the stomach, but should resolve in a day or two.    Call 911 if you suddenly have signs of blood loss such as:  Vomiting blood  Fast heart rate  Feeling faint, sweaty, or blacking out  Passing bright red blood from your rectum  Blood Clot Symptoms   Tender, swollen or reddened areas in your calf muscle or thighs.  Numbness or tingling in your lower leg or calf, or at the top of your leg or groin  Skin on your leg looks pale or blue or feels cold to touch  Chest pain or have trouble breathing, lightheadedness, fast heart rate  Sudden Onset of Symptoms    Call 911 if you suddenly have:  Leg weakness and spasm  Loss of bladder or bowel function  Seizure  Confusion, severe headache, dizziness or feeling unsteady, problems talking, difficulty swallowing, and/or numbness or muscle weakness as these could be signs of a stroke.   Follow up Appointment Your follow up appointment should be scheduled 2-3 weeks after your surgery date.  If you have not previously scheduled for a follow-up visit you can be scheduled by contacting [phone].

## 2023-02-26 NOTE — Op Note (Signed)
Nickita Beichler ([id number])  Operative Report  Date 02/26/2023   PREOPERATIVE DIAGNOSIS: Recurrent umbilical hernia repair with mesh (1cm x 2cm defect) (Mesh: 10cm x 5cm)  POSTOPERATIVE DIAGNOSIS: Same  PROCEDURE:  Robotic Transabdominal Preperitoneal Ventral Hernia Repair with Mesh (10cm x 5cm Mesh)   HERNIA CHARACTERISTICS: Location: umbilical Approach: Robotic Initial/Recurrent: recurrent Reducible/Incarcerated: easily reducible Mesh Implantation: Yes - 10cm x 5cm Medtronic ProGrip Mesh Hernia Size: 1cm x 2cm  IMPLANTS: Implant Name Type Inv. Item Serial No. Manufacturer Lot No. LRB No. Used Action  MESH PROGRIP LAP SLF FIX 16X12 - [id number] Mesh General MESH PROGRIP LAP SLF FIX 16X12  [employer] [place] [employer] [id number] N/A 1 Implanted    SURGEON: Melody Haver, MD  ANESTHESIA: General Anesthesia    INTRAOPERATIVE FINDINGS: Small umbilical hernia that had reduced prior to docking the robot.  There was a bulge adjacent to the umbilicus that was explored and found to be inflammatory tissue.   ESTIMATED BLOOD LOSS: Minimal   COMPLICATIONS: None  SPECIMENS: None  OPERATIVE INDICATIONS: Pt is a 33 y.o. male who had an umbilical hernia repaired several years ago. He developed a stitch abscess and had to have the permanent suture removed. He then developed a recurrent umbilical hernia with incarcerated fat. The patient desires definitive repair.  The risks, benefits and alternatives of the procedure were explained to the patient.  Risks including the risks of bleeding, infection, need for mesh removal, and potential of hernia recurrence were discussed.  The patient agreed to proceed and signed informed consent in front of a witness.   DESCRIPTION OF PROCEDURE: After preoperative identifying the patient in holding, the patient was brought to the operating room and placed supine on the operating table. SCDs were placed.  After induction of general anesthesia, the patient was given the  appropriate perioperative antibiotics.  Both arms were tucked and padded to avoid potential nerve injury.  The abdomen was prepped and draped in a typical sterile fashion.  A JACHO approved time out, where the name of the patient, the operation, and intended site was confirmed.  The procedure was begun.  An incision was made in the left upper quadrant and the Veress needle was introduced into the abdomen under standard drop technique.  The abdomen was insufflated with air.  An 8 mm left upper quadrant optical trocar was placed under direct visualization and the abdomen was inspected.  No evidence of injury was evident at the trocar site or secondary to the Veress needle.  Two additional 8 mm ports were placed in the left flank and epigastric region under direct visualization.  The daVinci robot was brought into the field and docked.  A 30 degree scope was placed in the left upper quadrant port.  Graspers were placed in the left flank port and dissection scissors were placed in the epigastric port.  The hernia was inspected.  The hernia was noted to to contain a small amount of fat.  The peritoneum at the falciform ligament was opened with sharp dissection and electrocautery.  The space was developed to accommodate reduction of the hernia as well as adequate space to place our mesh.  Attention was turned to the hernia.  Adhesions to the hernia were taken down sharply and with electrocautery.  Care was taken to not injury any of the abdominal contents of the hernia.  The hernia measured 1cm x 2cm. After defining our defect, the defect was closed primarily with a running #1 Stratafix Symmetric Suture.  Next, a 10 cm  x 5 cm piece of Medtronic ProGrip mesh was introduced in the abdomen.  The mesh was aligned appropriately with the defect to provide adequate coverage in all directions.  There was good coverage of the defect at completion.  Attention was then turned to closure of the peritoneal flap.  This was closed with  a running 3-0 Stratafix Spiral Suture.  The abdomen was thoroughly inspected a final time and hemostasis was assured.  Any suture needles were removed from the body.  The robot was undocked from the patient.  There was still a pronounced and firm bulge adjacent to his umbilicus.  I decided to explore this area to ensure no missed hernia.  I used scalpel to incise his previous incision inferior to this umbilicus.  I deepened this with electrocautery and soon encountered a mass of firm and indurated tissue. I probed the area with a Kelly clamp and did not identify any evidence of abscess or areas concerning for a hernia. The abdomen remained insufflated and there was no escape of air from the umbilical incision. The umbilical incision was closed using interrupted 3-0 vicryl.  The remaining ports were removed and the abdomen was desulfated.  The port sites were closed, local anesthetic was infiltrated, and Dermabond applied.  After confirming twice that the sponge, needle, and instrument counts were correct, the procedure was terminated, the patient was extubated and transferred to the recovery room in stable condition.  DISPOSITION: Stable to PACU.   Electronically Signed By: Moise Boring 02/26/2023 3:55 PM

## 2023-02-26 NOTE — Anesthesia Procedure Notes (Addendum)
Procedure Name: Intubation Date/Time: 02/26/2023 1:53 PM  Performed by: Sindy Guadeloupe, CRNAPre-anesthesia Checklist: Patient identified, Emergency Drugs available, Suction available, Patient being monitored and Timeout performed Patient Re-evaluated:Patient Re-evaluated prior to induction Oxygen Delivery Method: Circle system utilized Preoxygenation: Pre-oxygenation with 100% oxygen Induction Type: IV induction Ventilation: Mask ventilation without difficulty, Oral airway inserted - appropriate to patient size and Two handed mask ventilation required Laryngoscope Size: Mac and 4 Grade View: Grade II Tube type: Oral Tube size: 7.5 mm Number of attempts: 1 Airway Equipment and Method: Stylet Placement Confirmation: ETT inserted through vocal cords under direct vision, positive ETCO2 and breath sounds checked- equal and bilateral Secured at: 23 cm Tube secured with: Tape Dental Injury: Teeth and Oropharynx as per pre-operative assessment

## 2023-02-26 NOTE — H&P (Signed)
     Joseph Vazquez [birth year]  [id number].    HPI:  33 y/o M with a hx of umbilical hernia repair complicated by abscess s/p stitch removal c/b recurrent hernia who presents for elective repair. He reports that he is in his usual state of health and denies any recent changes in medication.   ROS: Review of Systems  Constitutional: Negative.   HENT: Negative.    Eyes: Negative.   Respiratory: Negative.    Cardiovascular: Negative.   Gastrointestinal: Negative.   Genitourinary: Negative.   Musculoskeletal: Negative.   Skin: Negative.   Neurological: Negative.   Endo/Heme/Allergies: Negative.   Psychiatric/Behavioral: Negative.      Family History  Problem Relation Age of Onset   Cancer Mother    Healthy Father     Past Medical History:  Diagnosis Date   Anxiety    Back pain    Depression    Headache    mirgrianes   Seizure [facility])     Past Surgical History:  Procedure Laterality Date   HERNIA REPAIR     VENTRAL HERNIA REPAIR N/A 08/07/2018   Procedure: HERNIA REPAIR umblical;  Surgeon: Berna Bue, MD;  Location: [facility];  Service: General;  Laterality: N/A;    Social History:  reports that he has quit smoking. His smoking use included cigarettes. He uses smokeless tobacco. He reports current alcohol use. He reports that he does not currently use drugs after having used the following drugs: Marijuana.  Allergies: No Known Allergies  Medications Prior to Admission  Medication Sig Dispense Refill   buPROPion (WELLBUTRIN XL) 150 MG 24 hr tablet Take 150 mg by mouth daily.     gabapentin (NEURONTIN) 300 MG capsule TAKE 2 CAPSULES BY MOUTH 3 TIMES A DAY 180 capsule 2   buPROPion (WELLBUTRIN XL) 300 MG 24 hr tablet Take 150 mg by mouth every morning.     glycopyrrolate (ROBINUL) 2 MG tablet Take 2 mg by mouth 2 (two) times daily.     testosterone cypionate (DEPOTESTOSTERONE CYPIONATE) 200 MG/ML injection Inject 200 mg into the skin every 14 (fourteen) days.      traMADol (ULTRAM) 50 MG tablet Take 1-2 tablets (50-100 mg total) by mouth every 6 (six) hours as needed for moderate pain (pain score 4-6). (Patient not taking: Reported on 02/22/2023) 20 tablet 0    Physical Exam: Blood pressure 123/75, pulse 76, temperature 98.4 F (36.9 C), temperature source Oral, resp. rate 18, height 6' (1.829 m), weight 125.2 kg, SpO2 97%. Gen: male, NAD Abd: soft, non-distended, umbilical bulge w/o skin changes  No results found for this or any previous visit (from the past 48 hours). No results found.  Assessment/Plan 33 y/o M w/ a hx of 2 prior operations for umbilical hernia who presents for elective umbilical hernia repair.  - Will proceed to the OR. We discussed the alternatives and potential risks of surgery, including but not limited to: bleeding, infection, damage to bowel or surrounding structures, mesh complications, chronic pain, hernia recurrence, and need for additional procedures. All questions were addressed and consent was obtained.   -Tentative plan for discharge from PACU  T[facility] 02/26/2023, 12:59 PM Please see Amion for pager number during day hours 7:00am-4:30pm or 7:00am -11:30am on weekends

## 2023-02-26 NOTE — Anesthesia Procedure Notes (Signed)
Anesthesia Regional Block: TAP block   Pre-Anesthetic Checklist: , timeout performed,  Correct Patient, Correct Site, Correct Laterality,  Correct Procedure, Correct Position, site marked,  Risks and benefits discussed,  Surgical consent,  Pre-op evaluation,  At surgeon's request and post-op pain management  Laterality: Right and Left  Prep: Maximum Sterile Barrier Precautions used, chloraprep       Needles:  Injection technique: Single-shot  Needle Type: Echogenic Needle      Needle Gauge: 20     Additional Needles:   Procedures:,,,, ultrasound used (permanent image in chart),,    Narrative:  Start time: 02/26/2023 1:31 PM End time: 02/26/2023 1:37 PM Injection made incrementally with aspirations every 5 mL.  Performed by: Personally  Anesthesiologist: Mariann Barter, MD

## 2023-02-26 NOTE — Transfer of Care (Signed)
Immediate Anesthesia Transfer of Care Note  Patient: Joseph Vazquez  Procedure(s) Performed: Procedure(s) with comments: ROBOTIC INCISIONAL HERNIA REPAIR WITH MESH (N/A) - GEN AND TAP BLOCK  Patient Location: PACU  Anesthesia Type:General  Level of Consciousness:  sedated, patient cooperative and responds to stimulation  Airway & Oxygen Therapy:Patient Spontanous Breathing and Patient connected to face mask oxgen  Post-op Assessment:  Report given to PACU RN and Post -op Vital signs reviewed and stable  Post vital signs:  Reviewed and stable  Last Vitals:  Vitals:   02/26/23 1226 02/26/23 1604  BP: 123/75 (!) 136/102  Pulse: 76 89  Resp: 18 11  Temp: 36.9 C   SpO2: 97% 100%    Complications: No apparent anesthesia complications

## 2023-02-28 NOTE — Anesthesia Postprocedure Evaluation (Signed)
Anesthesia Post Note  Patient: Joseph Vazquez  Procedure(s) Performed: ROBOTIC INCISIONAL HERNIA REPAIR WITH MESH     Patient location during evaluation: PACU Anesthesia Type: General Level of consciousness: awake and alert Pain management: pain level controlled Vital Signs Assessment: post-procedure vital signs reviewed and stable Respiratory status: spontaneous breathing, nonlabored ventilation, respiratory function stable and patient connected to nasal cannula oxygen Cardiovascular status: blood pressure returned to baseline and stable Postop Assessment: no apparent nausea or vomiting Anesthetic complications: no   No notable events documented.  Last Vitals:  Vitals:   02/26/23 1750 02/26/23 1800  BP: 106/79   Pulse:  77  Resp:  16  Temp:  36.9 C  SpO2:  94%    Last Pain:  Vitals:   02/26/23 1800  TempSrc:   PainSc: 3                  Mariann Barter

## 2023-03-01 ENCOUNTER — Ambulatory Visit (INDEPENDENT_AMBULATORY_CARE_PROVIDER_SITE_OTHER): Payer: BC Managed Care – PPO | Admitting: Diagnostic Neuroimaging

## 2023-03-01 ENCOUNTER — Encounter (HOSPITAL_COMMUNITY): Payer: Self-pay | Admitting: General Surgery

## 2023-03-01 DIAGNOSIS — R569 Unspecified convulsions: Secondary | ICD-10-CM

## 2023-03-02 NOTE — Procedures (Signed)
   [facility]  EEG (ELECTROENCEPHALOGRAM) REPORT   STUDY DATE: 03/01/23 PATIENT NAME: Joseph Vazquez DOB: 11-14-1990 MRN: [id number]  ORDERING CLINICIAN: Joycelyn Schmid, MD   TECHNOLOGIST: Marcheta Grammes TECHNIQUE: Electroencephalogram was recorded utilizing standard 10-20 system of lead placement and reformatted into average and bipolar montages.  RECORDING TIME: 29 minutes ACTIVATION: hyperventilation and photic stimulation  CLINICAL INFORMATION: K Suitor  FINDINGS: Posterior dominant background rhythms, which attenuate with eye opening, ranging 10-11 hertz and 20-30 microvolts. No focal, lateralizing, epileptiform activity or seizures are seen. Patient recorded in the awake and drowsy state. EKG channel shows regular rhythm of 65-70 beats per minute.   IMPRESSION:   Normal EEG in the awake and drowsy states.    INTERPRETING PHYSICIAN:  Suanne Marker, MD Certified in Neurology, Neurophysiology and Neuroimaging  [facility] [address] [place], [place] [address] [phone]

## 2023-03-05 NOTE — Progress Notes (Signed)
Normal EEG. -VRP

## 2023-03-08 ENCOUNTER — Emergency Department (HOSPITAL_BASED_OUTPATIENT_CLINIC_OR_DEPARTMENT_OTHER)
Admission: EM | Admit: 2023-03-08 | Discharge: 2023-03-08 | Disposition: A | Discharge status: Home / Self Care | Payer: Commercial Managed Care - HMO | Attending: Emergency Medicine | Admitting: Emergency Medicine

## 2023-03-08 ENCOUNTER — Other Ambulatory Visit: Payer: Self-pay

## 2023-03-08 ENCOUNTER — Encounter (HOSPITAL_BASED_OUTPATIENT_CLINIC_OR_DEPARTMENT_OTHER): Payer: Self-pay | Admitting: Emergency Medicine

## 2023-03-08 ENCOUNTER — Emergency Department (HOSPITAL_BASED_OUTPATIENT_CLINIC_OR_DEPARTMENT_OTHER): Payer: Commercial Managed Care - HMO

## 2023-03-08 DIAGNOSIS — Z79899 Other long term (current) drug therapy: Secondary | ICD-10-CM | POA: Diagnosis not present

## 2023-03-08 DIAGNOSIS — R197 Diarrhea, unspecified: Secondary | ICD-10-CM | POA: Diagnosis not present

## 2023-03-08 DIAGNOSIS — Z8776 Personal history of (corrected) congenital diaphragmatic hernia or other congenital diaphragm malformations: Secondary | ICD-10-CM | POA: Insufficient documentation

## 2023-03-08 DIAGNOSIS — D72829 Elevated white blood cell count, unspecified: Secondary | ICD-10-CM | POA: Insufficient documentation

## 2023-03-08 DIAGNOSIS — L539 Erythematous condition, unspecified: Secondary | ICD-10-CM | POA: Diagnosis not present

## 2023-03-08 DIAGNOSIS — R1032 Left lower quadrant pain: Secondary | ICD-10-CM | POA: Insufficient documentation

## 2023-03-08 DIAGNOSIS — Z8719 Personal history of other diseases of the digestive system: Secondary | ICD-10-CM

## 2023-03-08 LAB — URINALYSIS, W/ REFLEX TO CULTURE (INFECTION SUSPECTED)
Glucose, UA: NEGATIVE mg/dL
Hgb urine dipstick: NEGATIVE
Nitrite: NEGATIVE
Protein, ur: 30 mg/dL — AB
Specific Gravity, Urine: 1.04 — ABNORMAL HIGH (ref 1.005–1.030)
pH: 6.5 (ref 5.0–8.0)

## 2023-03-08 LAB — LACTIC ACID, PLASMA: Lactic Acid, Venous: 0.9 mmol/L (ref 0.5–1.9)

## 2023-03-08 LAB — CBC WITH DIFFERENTIAL/PLATELET
Abs Immature Granulocytes: 0.02 10*3/uL (ref 0.00–0.07)
Basophils Absolute: 0.1 10*3/uL (ref 0.0–0.1)
Basophils Relative: 1 %
Eosinophils Absolute: 0.2 10*3/uL (ref 0.0–0.5)
Eosinophils Relative: 1 %
HCT: 43.6 % (ref 39.0–52.0)
Hemoglobin: 15.7 g/dL (ref 13.0–17.0)
Immature Granulocytes: 0 %
Lymphocytes Relative: 40 %
Lymphs Abs: 4.4 10*3/uL — ABNORMAL HIGH (ref 0.7–4.0)
MCH: 31.7 pg (ref 26.0–34.0)
MCHC: 36 g/dL (ref 30.0–36.0)
MCV: 88.1 fL (ref 80.0–100.0)
Monocytes Absolute: 0.6 10*3/uL (ref 0.1–1.0)
Monocytes Relative: 6 %
Neutro Abs: 5.7 10*3/uL (ref 1.7–7.7)
Neutrophils Relative %: 52 %
Platelets: 387 10*3/uL (ref 150–400)
RBC: 4.95 MIL/uL (ref 4.22–5.81)
RDW: 12.1 % (ref 11.5–15.5)
WBC: 11 10*3/uL — ABNORMAL HIGH (ref 4.0–10.5)
nRBC: 0 % (ref 0.0–0.2)

## 2023-03-08 LAB — COMPREHENSIVE METABOLIC PANEL
ALT: 26 U/L (ref 0–44)
AST: 15 U/L (ref 15–41)
Albumin: 5 g/dL (ref 3.5–5.0)
Alkaline Phosphatase: 88 U/L (ref 38–126)
Anion gap: 10 (ref 5–15)
BUN: 10 mg/dL (ref 6–20)
CO2: 27 mmol/L (ref 22–32)
Calcium: 9.5 mg/dL (ref 8.9–10.3)
Chloride: 102 mmol/L (ref 98–111)
Creatinine, Ser: 1.12 mg/dL (ref 0.61–1.24)
GFR, Estimated: >60 mL/min (ref >60)
Glucose, Bld: 94 mg/dL (ref 70–99)
Potassium: 4 mmol/L (ref 3.5–5.1)
Sodium: 139 mmol/L (ref 135–145)
Total Bilirubin: 0.8 mg/dL (ref 0.0–1.2)
Total Protein: 7.7 g/dL (ref 6.5–8.1)

## 2023-03-08 MED ORDER — DICYCLOMINE HCL 20 MG PO TABS: 20.0000 mg | ORAL_TABLET | Freq: Two times a day (BID) | ORAL | 0 refills | Status: AC

## 2023-03-08 MED ORDER — DICYCLOMINE HCL 10 MG/ML IM SOLN
20.0000 mg | Freq: Once | INTRAMUSCULAR | Status: AC
  Administered 2023-03-08: 20 mg via INTRAMUSCULAR
  Filled 2023-03-08: qty 2

## 2023-03-08 MED ORDER — IOHEXOL 300 MG/ML  SOLN
100.0000 mL | Freq: Once | INTRAMUSCULAR | Status: AC | PRN
  Administered 2023-03-08: 100 mL via INTRAVENOUS

## 2023-03-08 MED ORDER — IBUPROFEN 800 MG PO TABS: 800.0000 mg | ORAL_TABLET | Freq: Once | ORAL | Status: DC

## 2023-03-08 NOTE — Discharge Instructions (Signed)
As discussed, your imaging is reassuring.  You can take Bentyl for twice a day as needed for left-sided abdominal pain/cramping.  Additionally, you can alternate between ibuprofen and Tylenol every 4 hours as needed.  Follow-up with your primary care provider within the next 5-7 days for reevaluation.  Get help right away if: You cannot stop vomiting. Your pain is only in one part of your belly, like on the right side. You have bloody or black poop, or poop that looks like tar. You have trouble breathing. You have chest pain.

## 2023-03-08 NOTE — ED Triage Notes (Signed)
Post hernia surgery (1/17) c/o redness and swelling around incision site. Also endorses cramping in LLQ near incision sites. Denies fevers.

## 2023-03-08 NOTE — ED Provider Notes (Signed)
[facility] AT [facility] Provider Note   CSN: [id number] Arrival date & time: 03/08/23  1401     History  Chief Complaint  Patient presents with   Post-op Problem    Joseph Vazquez is a 33 y.o. male with a history of seizures and umbilical hernia presents to the ED today for abdominal pain.  Patient reports hernia surgery 02/26/2023, he states that the glue to one of the incision sites at the left lower abdomen came apart several days ago that he has been having redness around the site since without discharge.  Additionally, patient reports that he was getting up today and had cramping pain to the left side of his abdomen.  He took Tylenol for the pain prior to arrival without any improvement.  Subsequently, patient endorses about 6 or 7 episodes of diarrhea since the procedure as well as several episodes of vomiting that occurred only yesterday.  Denies fevers, dysuria, chest pain, shortness of breath.  No unilateral leg pain or swelling.  No additional complaints or concerns at this time.    Home Medications Prior to Admission medications   Medication Sig Start Date End Date Taking? Authorizing Provider  dicyclomine (BENTYL) 20 MG tablet Take 1 tablet (20 mg total) by mouth 2 (two) times daily. 03/08/23 04/07/23 Yes Maxwell Marion, PA-C  buPROPion (WELLBUTRIN XL) 150 MG 24 hr tablet Take 150 mg by mouth daily. 08/11/22   [provider]  buPROPion (WELLBUTRIN XL) 300 MG 24 hr tablet Take 150 mg by mouth every morning.    [provider]  gabapentin (NEURONTIN) 300 MG capsule TAKE 2 CAPSULES BY MOUTH 3 TIMES A DAY 02/04/23   London Sheer, MD  glycopyrrolate (ROBINUL) 2 MG tablet Take 2 mg by mouth 2 (two) times daily. 01/11/23   [provider]  testosterone cypionate (DEPOTESTOSTERONE CYPIONATE) 200 MG/ML injection Inject 200 mg into the skin every 14 (fourteen) days. 01/17/23   [provider]      Allergies    Patient has  no known allergies.    Review of Systems   Review of Systems  Gastrointestinal:  Positive for abdominal pain.  All other systems reviewed and are negative.   Physical Exam Updated Vital Signs BP 119/86   Pulse 74   Temp 98.7 F (37.1 C) (Oral)   Resp 20   Ht 6' (1.829 m)   Wt 124.7 kg   SpO2 97%   BMI 37.30 kg/m  Physical Exam Vitals and nursing note reviewed.  Constitutional:      General: He is not in acute distress.    Appearance: Normal appearance.  HENT:     Head: Normocephalic and atraumatic.     Mouth/Throat:     Mouth: Mucous membranes are moist.  Eyes:     Conjunctiva/sclera: Conjunctivae normal.     Pupils: Pupils are equal, round, and reactive to light.  Cardiovascular:     Rate and Rhythm: Normal rate and regular rhythm.     Pulses: Normal pulses.     Heart sounds: Normal heart sounds.  Pulmonary:     Effort: Pulmonary effort is normal.     Breath sounds: Normal breath sounds.  Abdominal:     General: There is no distension.     Palpations: Abdomen is soft.     Tenderness: There is abdominal tenderness. There is no right CVA tenderness or left CVA tenderness.     Comments: Tenderness to the left side of the abdomen  with erythema around the incision site at the left lower quadrant  Musculoskeletal:        General: Normal range of motion.     Cervical back: Normal range of motion.  Skin:    General: Skin is warm and dry.     Findings: Erythema present. No rash.     Comments: Erythema surrounding inferior left incision site without warmth to touch or edema  Neurological:     General: No focal deficit present.     Mental Status: He is alert.  Psychiatric:        Mood and Affect: Mood normal.        Behavior: Behavior normal.    ED Results / Procedures / Treatments   Labs (all labs ordered are listed, but only abnormal results are displayed) Labs Reviewed  CBC WITH DIFFERENTIAL/PLATELET - Abnormal; Notable for the following components:       Result Value   WBC 11.0 (*)    Lymphs Abs 4.4 (*)    All other components within normal limits  URINALYSIS, W/ REFLEX TO CULTURE (INFECTION SUSPECTED) - Abnormal; Notable for the following components:   Specific Gravity, Urine 1.040 (*)    Bilirubin Urine SMALL (*)    Ketones, ur TRACE (*)    Protein, ur 30 (*)    Leukocytes,Ua TRACE (*)    Bacteria, UA FEW (*)    All other components within normal limits  LACTIC ACID, PLASMA  COMPREHENSIVE METABOLIC PANEL  LACTIC ACID, PLASMA    EKG None  Radiology CT ABDOMEN PELVIS W CONTRAST Result Date: 03/08/2023 CLINICAL DATA:  Postoperative abdominal pain. Hernia surgery on 01/17. Left lower quadrant pain near incision. EXAM: CT ABDOMEN AND PELVIS WITH CONTRAST TECHNIQUE: Multidetector CT imaging of the abdomen and pelvis was performed using the standard protocol following bolus administration of intravenous contrast. RADIATION DOSE REDUCTION: This exam was performed according to the departmental dose-optimization program which includes automated exposure control, adjustment of the mA and/or kV according to patient size and/or use of iterative reconstruction technique. CONTRAST:  OMNIPAQUE IOHEXOL 300 MG/ML  SOLN COMPARISON:  CT abdomen and pelvis 01/31/2023 FINDINGS: Lower chest: No acute abnormality. Hepatobiliary: No focal liver abnormality is seen. No gallstones, gallbladder wall thickening, or biliary dilatation. Pancreas: Unremarkable. No pancreatic ductal dilatation or surrounding inflammatory changes. Spleen: Normal in size without focal abnormality. Adrenals/Urinary Tract: Adrenal glands are unremarkable. Kidneys are normal, without renal calculi, focal lesion, or hydronephrosis. Bladder is unremarkable. Stomach/Bowel: Stomach is within normal limits. Appendix appears normal. No evidence of bowel wall thickening, distention, or inflammatory changes. Vascular/Lymphatic: No significant vascular findings are present. No enlarged abdominal or  pelvic lymph nodes. Reproductive: Prostate is unremarkable. Other: There is no ascites. Within the subcutaneous tissues at the level of the umbilicus there is a 2.7 by 1.3 by 1.2 cm enhancing fluid collection which appears similar to prior. There is no surrounding inflammatory stranding. There is no recurrent hernia at this level. A mesh is present along the anterior abdominal wall at this level. There is mild subcutaneous edema in the left anterior lower quadrant abdominal wall without fluid collection or air. Musculoskeletal: No acute or significant osseous findings. IMPRESSION: 1. Stable 2.7 cm enhancing fluid collection in the subcutaneous tissues at the level of the umbilicus. This may represent a postoperative seroma or hematoma. Sterility is indeterminate. 2. Mild subcutaneous edema in the left anterior lower quadrant abdominal wall without fluid collection or air. Findings may be related to cellulitis. Electronically Signed  By: Darliss Cheney M.D.   On: 03/08/2023 18:07    Procedures Procedures: not indicated.   Medications Ordered in ED Medications  ibuprofen (ADVIL) tablet 800 mg (800 mg Oral Not Given 03/08/23 2122)  iohexol (OMNIPAQUE) 300 MG/ML solution 100 mL (100 mLs Intravenous Contrast Given 03/08/23 1721)  dicyclomine (BENTYL) injection 20 mg (20 mg Intramuscular Given 03/08/23 1910)    ED Course/ Medical Decision Making/ A&P                                 Medical Decision Making Amount and/or Complexity of Data Reviewed Labs: ordered. Radiology: ordered.  Risk Prescription drug management.   This patient presents to the ED for concern of abdominal pain, this involves an extensive number of treatment options, and is a complaint that carries with it a high risk of complications and morbidity.   Differential diagnosis includes: gastritis, gastroenteritis, bowel obstruction, perforation, diverticulitis, wound infection, etc.   Comorbidities  See HPI  above   Additional History  Additional history obtained from prior records.   Lab Tests  I ordered and personally interpreted labs.  The pertinent results include:   Negative lactic acid CMP unremarkable WBC of 11 otherwise CBC is unremarkable UA shows few bacteria, trace leukocytes, protein, ketones, and small bilirubin   Imaging Studies  I ordered imaging studies including CT abdomen/pelvis with contrast  I independently visualized and interpreted imaging which showed:  Stable 2.7 cm enhancing fluid collection in the subcutaneous tissue at the level of the umbilicus.  May represent postoperative seroma or hematoma. Mild left subcutaneous edema in the left anterior lower quadrant abdominal wall without fluid collection or air. Low suspicion for cellulitis based off physical presentation of incision site -no warmth to touch or surrounding edema I agree with the radiologist interpretation   Problem List / ED Course / Critical Interventions / Medication Management  Left sided abdominal cramping this afternoon with redness around incision site Patient sat with my attending, Dr. Andria Meuse I ordered medications including: Bentyl for abdominal cramping  Reevaluation of the patient after these medicines showed that the patient improved I have reviewed the patients home medicines and have made adjustments as needed Discussed findings with patient.  All questions were answered. Prescription for Bentyl sent to the pharmacy.   Social Determinants of Health  Access to healthcare   Test / Admission - Considered  He is stable and safe discharge home. Return precautions provided.       Final Clinical Impression(s) / ED Diagnoses Final diagnoses:  Left lower quadrant pain  History of umbilical hernia repair    Rx / DC Orders ED Discharge Orders          Ordered    dicyclomine (BENTYL) 20 MG tablet  2 times daily        03/08/23 2113              Maxwell Marion,  PA-C 03/08/23 2128    Anders Simmonds T, DO 03/08/23 2307

## 2023-03-15 ENCOUNTER — Encounter: Payer: Self-pay | Admitting: Cardiovascular Disease

## 2023-03-18 ENCOUNTER — Ambulatory Visit: Payer: Commercial Managed Care - HMO | Attending: Cardiovascular Disease | Admitting: Cardiovascular Disease

## 2023-03-18 ENCOUNTER — Encounter: Payer: Self-pay | Admitting: Cardiovascular Disease

## 2023-03-18 VITALS — BP 124/84 | HR 109 | Ht 72.0 in

## 2023-03-18 DIAGNOSIS — R55 Syncope and collapse: Secondary | ICD-10-CM | POA: Diagnosis not present

## 2023-03-18 NOTE — Progress Notes (Signed)
 Cardiology Office Note:  .   Date:  03/18/2023  ID:  Joseph Vazquez, DOB 1990-07-01, MRN [id number] PCP: Joseph Lamarr RAMAN, MD  [facility] Providers Cardiologist:  None { History of Present Illness: .    Chief Complaint  Patient presents with   Follow-up    6-8 weeks.   Joseph Vazquez is a 33 y.o. male with history of syncope who presents for follow-up. Had an isolated syncopal event in December. Negative TTE and monitor.   History of Present Illness   Joseph Vazquez is a 33 year old male who presents for follow-up after a syncopal episode.  He experienced a syncopal episode at work in December, which was an isolated incident with no further episodes since. A previous workup included an echocardiogram and a cardiac monitor, both of which were normal. No chest pain or trouble breathing has occurred since the episode.  He was recently diagnosed with hypertension and started on propranolol 10 mg bid last week due to elevated blood pressure readings. He continues to take this medication as prescribed.  He has a history of depression and anxiety, which has been particularly severe lately. He is currently on fluoxetine 20 mg, having switched from Wellbutrin after the syncopal episode.  He recently underwent hernia surgery with mesh placement and was on post-surgery restrictions. He is now seeking clearance to return to work.          Problem List Obesity Hernia surgery HTN    ROS: All other ROS reviewed and negative. Pertinent positives noted in the HPI.     Studies Reviewed: SABRA   EKG Interpretation Date/Time:  Thursday March 18 2023 15:36:26 EST Ventricular Rate:  109 PR Interval:  144 QRS Duration:  74 QT Interval:  328 QTC Calculation: 441 R Axis:   32  Text Interpretation: Sinus tachycardia Nonspecific T wave abnormality Confirmed by Joseph Vazquez [phone]) on 03/18/2023 3:37:49 PM   Zio 01/20/2023 Impression: No significant arrhythmias.  Rare ectopy.   TTE  02/19/2023  1. Left ventricular ejection fraction, by estimation, is 60 to 65%. The  left ventricle has normal function. The left ventricle has no regional  wall motion abnormalities. Left ventricular diastolic parameters were  normal. The average left ventricular  global longitudinal strain is -21.6 %. The global longitudinal strain is  normal.   2. Right ventricular systolic function is normal. The right ventricular  size is normal. There is normal pulmonary artery systolic pressure.   3. The mitral valve is normal in structure. No evidence of mitral valve  regurgitation. No evidence of mitral stenosis.   4. The aortic valve is tricuspid. Aortic valve regurgitation is not  visualized. No aortic stenosis is present.   5. The inferior vena cava is normal in size with greater than 50%  respiratory variability, suggesting right atrial pressure of 3 mmHg.  Physical Exam:   VS:  BP 124/84 (BP Location: Left Arm, Patient Position: Sitting, Cuff Size: Normal)   Pulse (!) 109   Ht 6' (1.829 m)   BMI 37.30 kg/m    Wt Readings from Last 3 Encounters:  03/08/23 275 lb (124.7 kg)  02/26/23 276 lb (125.2 kg)  02/22/23 274 lb (124.3 kg)    GEN: Well nourished, well developed in no acute distress NECK: No JVD; No carotid bruits CARDIAC: RRR, no murmurs, rubs, gallops RESPIRATORY:  Clear to auscultation without rales, wheezing or rhonchi  ABDOMEN: Soft, non-tender, non-distended EXTREMITIES:  No edema; No deformity  ASSESSMENT AND PLAN: .  Assessment and Plan    Syncope Single episode, no recurrence. EKG, Echo, and monitor all normal. Likely vasovagal event. No cardiovascular restrictions for work. -suspect isolated vasovagal episode. Labs normal. No history of seizures. Normal cardiac work-up.  -Return to work without cardiac restrictions. Follow up as needed.  Hypertension Recently started on Propranolol 10mg  BID by primary care physician. -Continue Propranolol 10mg  BID. Follow up with  primary care physician.              Follow-up: Return if symptoms worsen or fail to improve.  Signed, Joseph Vazquez. Barbaraann, MD, [facility]  [facility]  [address] [place], [place] [address] [phone]  3:52 PM

## 2023-03-18 NOTE — Patient Instructions (Signed)
 Medication Instructions:  Your physician recommends that you continue on your current medications as directed. Please refer to the Current Medication list given to you today.    *If you need a refill on your cardiac medications before your next appointment, please call your pharmacy*   Lab Work: None    If you have labs (blood work) drawn today and your tests are completely normal, you will receive your results only by: MyChart Message (if you have MyChart) OR A paper copy in the mail If you have any lab test that is abnormal or we need to change your treatment, we will call you to review the results.   Testing/Procedures: None    Follow-Up: At [facility], you and your health needs are our priority.  As part of our continuing mission to provide you with exceptional heart care, we have created designated Provider Care Teams.  These Care Teams include your primary Cardiologist (physician) and Advanced Practice Providers (APPs -  Physician Assistants and Nurse Practitioners) who all work together to provide you with the care you need, when you need it.  We recommend signing up for the patient portal called "MyChart".  Sign up information is provided on this After Visit Summary.  MyChart is used to connect with patients for Virtual Visits (Telemedicine).  Patients are able to view lab/test results, encounter notes, upcoming appointments, etc.  Non-urgent messages can be sent to your provider as well.   To learn more about what you can do with MyChart, go to [email or web address].    Your next appointment:   Follow up as needed   Lennie Odor, MD     Other Instructions

## 2023-04-12 ENCOUNTER — Telehealth: Admitting: Physician Assistant

## 2023-04-12 DIAGNOSIS — R300 Dysuria: Secondary | ICD-10-CM

## 2023-04-12 NOTE — Progress Notes (Signed)
E-Visit for Urinary Problems  Based on what you shared with me, I feel your condition warrants further evaluation and I recommend that you be seen for a face to face office visit.  Male bladder infections are not very common.  We worry about prostate or kidney conditions.  The standard of care is to examine the abdomen and kidneys, and to do a urine and blood test to make sure that something more serious is not going on.  We recommend that you see a provider today.  If your doctor's office is closed [place] [place] has the following Urgent Cares:    NOTE: You will not be charged for this e-visit.  If you are having a true medical emergency please call 911.       For an urgent face to face visit, [facility] has six urgent care centers for your convenience:     [facility] [place] Get Driving Directions [phone] [address] [place], [place] [address]    [facility] ([place]) Get Driving Directions [phone] [address] [place], [place] [address]  [facility] [facility] [place]) Get Driving Directions [phone] [address] [place],  [place]  [address]  [facility] Get Driving Directions [phone] [address] [place], [place] [address]   [facility] [place] Get Driving Directions  [phone] [address] [place], [place] [address]   [facility] [place] Get Driving Directions [phone] [address] [place], [place] [address]  Your MyChart E-visit questionnaire answers were reviewed by a board certified advanced clinical practitioner to complete your personal care plan based on your specific symptoms.  Thank you for using e-Visits.     I have spent 5 minutes in review of e-visit questionnaire, review and updating patient chart, medical  decision making and response to patient.   Jennifer M Burnette, PA-C  

## 2023-04-20 ENCOUNTER — Other Ambulatory Visit: Payer: Self-pay | Admitting: Orthopedic Surgery

## 2023-05-02 ENCOUNTER — Other Ambulatory Visit: Payer: Self-pay | Admitting: Orthopaedic Surgery

## 2023-05-03 ENCOUNTER — Ambulatory Visit: Admission: EM | Admit: 2023-05-03 | Discharge: 2023-05-03 | Disposition: A | Discharge status: Home / Self Care

## 2023-05-03 DIAGNOSIS — M51369 Other intervertebral disc degeneration, lumbar region without mention of lumbar back pain or lower extremity pain: Secondary | ICD-10-CM | POA: Insufficient documentation

## 2023-05-03 DIAGNOSIS — E291 Testicular hypofunction: Secondary | ICD-10-CM | POA: Insufficient documentation

## 2023-05-03 DIAGNOSIS — R519 Headache, unspecified: Secondary | ICD-10-CM | POA: Diagnosis not present

## 2023-05-03 DIAGNOSIS — Z720 Tobacco use: Secondary | ICD-10-CM | POA: Insufficient documentation

## 2023-05-03 DIAGNOSIS — A084 Viral intestinal infection, unspecified: Secondary | ICD-10-CM

## 2023-05-03 LAB — POC COVID19/FLU A&B COMBO
Covid Antigen, POC: NEGATIVE
Influenza A Antigen, POC: NEGATIVE
Influenza B Antigen, POC: NEGATIVE

## 2023-05-03 MED ORDER — BUTALBITAL-APAP-CAFFEINE 50-325-40 MG PO TABS: 1.0000 | ORAL_TABLET | Freq: Two times a day (BID) | ORAL | 1 refills | Status: AC | PRN

## 2023-05-03 MED ORDER — ONDANSETRON HCL 4 MG PO TABS: 4.0000 mg | ORAL_TABLET | Freq: Three times a day (TID) | ORAL | 0 refills | Status: AC | PRN

## 2023-05-03 MED ORDER — BUTALBITAL-APAP-CAFFEINE 50-325-40 MG PO TABS: 1.0000 | ORAL_TABLET | Freq: Two times a day (BID) | ORAL | 1 refills | Status: DC | PRN

## 2023-05-03 MED ORDER — ONDANSETRON 4 MG PO TBDP
4.0000 mg | ORAL_TABLET | Freq: Once | ORAL | Status: AC
  Administered 2023-05-03: 4 mg via ORAL

## 2023-05-03 NOTE — ED Triage Notes (Signed)
"  This started with ha (migraine) on Friday then vomiting after, the ha has been on/off since with nausea/vomiting (on/off), last emesis around 430am". "My body has been aching since over the weekend". No runny nose or cough. "Some cold sweats". No fever (known). No seasonal Flu or COVID19 vaccine.

## 2023-05-03 NOTE — ED Provider Notes (Signed)
 [name]    CSN: [id number] Arrival date & time: 05/03/23  1627      History   Chief Complaint Chief Complaint  Patient presents with   Nausea   Generalized Body Aches   Headache    HPI Joseph Vazquez is a 33 y.o. male.   Migraine headaches, chronic, recurrent Viral illness symptoms Patient endorses a history of recurrent migrainous type headaches.  In the past he is treated with NSAIDs but currently has been advised by his primary care doctor to avoid NSAIDs due to GI issues in a recent change in renal functioning.  He reports current headache started on Friday and has recurred daily.  Nuys any visual acuity changes.  Sequently developed generalized bodyaches, nausea and vomiting which is also been ongoing since Friday in the presence of a headache and without any headache.  Patient is concerned for COVID infection.  Denies any URI symptoms.  Past Medical History:  Diagnosis Date   Anxiety    Back pain    Depression    Headache    mirgrianes   Right knee pain 07/05/2012   Seizure (HCC)    Sprain of unspecified ligament of right ankle, initial encounter 03/03/2022    Patient Active Problem List   Diagnosis Date Noted   Degeneration of lumbar intervertebral disc 05/03/2023   Male hypogonadism 05/03/2023   Tobacco user 05/03/2023   Strangulated umbilical hernia 01/31/2023   Facet degeneration of lumbar region 03/13/2022   Protrusion of lumbar intervertebral disc 06/03/2020   Radiculopathy, lumbar region 06/03/2020   Nicotine addiction 12/01/2011   Gastroesophageal reflux disease 09/17/2008   OTHER SEBORRHEIC DERMATITIS 09/17/2008   Hyperhidrosis 09/17/2008    Past Surgical History:  Procedure Laterality Date   HERNIA REPAIR     VENTRAL HERNIA REPAIR N/A 08/07/2018   Procedure: HERNIA REPAIR umblical;  Surgeon: Berna Bue, MD;  Location: [facility];  Service: General;  Laterality: N/A;   XI ROBOTIC ASSISTED VENTRAL HERNIA N/A 02/26/2023    Procedure: ROBOTIC INCISIONAL HERNIA REPAIR WITH MESH;  Surgeon: Moise Boring, MD;  Location: [facility];  Service: General;  Laterality: N/A;  GEN AND TAP BLOCK       Home Medications    Prior to Admission medications   Medication Sig Start Date End Date Taking? Authorizing Provider  FLUoxetine (PROZAC) 20 MG capsule Take 20 mg by mouth daily. 03/13/23  Yes [provider]  gabapentin (NEURONTIN) 300 MG capsule TAKE 2 CAPSULES BY MOUTH 3 TIMES A DAY 05/03/23  Yes Eldred Manges, MD  propranolol (INDERAL) 10 MG tablet Take 10 mg by mouth. 03/13/23  Yes [provider]  buPROPion (WELLBUTRIN XL) 150 MG 24 hr tablet Take 150 mg by mouth every morning.    [provider]  dicyclomine (BENTYL) 20 MG tablet Take 1 tablet (20 mg total) by mouth 2 (two) times daily. 03/08/23 04/07/23  Maxwell Marion, PA-C  glycopyrrolate (ROBINUL) 2 MG tablet Take 2 mg by mouth 2 (two) times daily. 01/11/23   [provider]  meloxicam (MOBIC) 15 MG tablet Take 15 mg by mouth daily.    [provider]  Semaglutide 7 MG TABS 1 tablet at least 30 minutes before first food, beverage or other oral medicine of the day Orally Once a day    [provider]  testosterone cypionate (DEPOTESTOSTERONE CYPIONATE) 200 MG/ML injection Inject 200 mg into the skin every 14 (fourteen) days. 01/17/23   [provider]  Family History Family History  Problem Relation Age of Onset   Cancer Mother    Healthy Father     Social History Social History   Tobacco Use   Smoking status: Former    Current packs/day: 0.05    Types: Cigarettes   Smokeless tobacco: Never  Vaping Use   Vaping status: Every Day   Substances: Nicotine, Flavoring  Substance Use Topics   Alcohol use: Yes    Comment: Socially   Drug use: Yes    Types: Marijuana     Allergies   Patient has no known allergies.   Review of Systems Review of Systems  Neurological:  Positive for headaches.      Physical Exam Triage Vital Signs ED Triage Vitals  Encounter Vitals Group     BP 05/03/23 1753 128/77     Systolic BP Percentile --      Diastolic BP Percentile --      Pulse Rate 05/03/23 1753 69     Resp 05/03/23 1753 18     Temp 05/03/23 1753 98.4 F (36.9 C)     Temp Source 05/03/23 1753 Oral     SpO2 05/03/23 1753 97 %     Weight 05/03/23 1750 260 lb (117.9 kg)     Height 05/03/23 1750 6' (1.829 m)     Head Circumference --      Peak Flow --      Pain Score 05/03/23 1748 0     Pain Loc --      Pain Education --      Exclude from Growth Chart --    No data found.  Updated Vital Signs BP 128/77 (BP Location: Left Arm)   Pulse 69   Temp 98.4 F (36.9 C) (Oral)   Resp 18   Ht 6' (1.829 m)   Wt 260 lb (117.9 kg)   SpO2 97%   BMI 35.26 kg/m   Visual Acuity Right Eye Distance:   Left Eye Distance:   Bilateral Distance:    Right Eye Near:   Left Eye Near:    Bilateral Near:     Physical Exam   UC Treatments / Results  Labs (all labs ordered are listed, but only abnormal results are displayed) Labs Reviewed - No data to display  EKG   Radiology No results found.  Procedures Procedures (including critical care time)  Medications Ordered in UC Medications  ondansetron (ZOFRAN-ODT) disintegrating tablet 4 mg (4 mg Oral Given 05/03/23 1756)    Initial Impression / Assessment and Plan / UC Course  I have reviewed the triage vital signs and the nursing notes.  Pertinent labs & imaging results that were available during my care of the patient were reviewed by me and considered in my medical decision making (see chart for details).     *** Final Clinical Impressions(s) / UC Diagnoses   Final diagnoses:  None   Discharge Instructions   None    ED Prescriptions   None    PDMP not reviewed this encounter.

## 2023-05-03 NOTE — Discharge Instructions (Addendum)
 COVID/flu test are negative.  On your symptoms I do suspect you have a GI viral illness which treatment is symptom management and forcing fluids along with rest.  I have prescribed you Zofran take as needed for nausea and vomiting.  I have prescribed you Fioricet which you may take up to twice daily as needed for headache pain.  Remember I stated to take the Fioricet at the first sign of the headache.

## 2023-06-09 ENCOUNTER — Other Ambulatory Visit: Payer: Self-pay

## 2023-06-09 ENCOUNTER — Encounter (HOSPITAL_BASED_OUTPATIENT_CLINIC_OR_DEPARTMENT_OTHER): Payer: Self-pay | Admitting: Emergency Medicine

## 2023-06-09 ENCOUNTER — Emergency Department (HOSPITAL_BASED_OUTPATIENT_CLINIC_OR_DEPARTMENT_OTHER)
Admission: EM | Admit: 2023-06-09 | Discharge: 2023-06-09 | Disposition: A | Discharge status: Home / Self Care | Attending: Emergency Medicine | Admitting: Emergency Medicine

## 2023-06-09 ENCOUNTER — Other Ambulatory Visit (HOSPITAL_BASED_OUTPATIENT_CLINIC_OR_DEPARTMENT_OTHER): Payer: Self-pay

## 2023-06-09 DIAGNOSIS — L03012 Cellulitis of left finger: Secondary | ICD-10-CM | POA: Diagnosis present

## 2023-06-09 DIAGNOSIS — L03011 Cellulitis of right finger: Secondary | ICD-10-CM | POA: Diagnosis not present

## 2023-06-09 MED ORDER — DOXYCYCLINE HYCLATE 100 MG PO CAPS
100.0000 mg | ORAL_CAPSULE | Freq: Two times a day (BID) | ORAL | 0 refills | Status: AC
  Filled 2023-06-09: qty 14, 7d supply, fill #0

## 2023-06-09 NOTE — ED Provider Notes (Signed)
 [place] EM[facility] DEPARTMENT AT [facility] Provider Note   CSN: [id number] Arrival date & time: 06/09/23  1156     History  Chief Complaint  Patient presents with   Finger Injury    Joseph Vazquez is a 33 y.o. male with no significant past medical history presents with concern for a cut to his right middle finger that occurred 6 days ago.  States he cut the finger with glass, and then put a skin glue over it to help it heal.  Reports that yesterday it popped open and has since been more red and swollen in the area.  Denies any drainage from the area.  Denies any difficulty moving his finger.  Also reports concern for a to the base of his left ring finger occurred approximately a week ago when cleaning out his lawnmower.  Denies any drainage over that wound.  Reports his tetanus is up-to-date.  HPI     Home Medications Prior to Admission medications   Medication Sig Start Date End Date Taking? Authorizing Provider  doxycycline  (VIBRAMYCIN ) 100 MG capsule Take 1 capsule (100 mg total) by mouth 2 (two) times daily for 7 days. 06/09/23 06/16/23 Yes Rexie Catena, PA-C  buPROPion (WELLBUTRIN XL) 150 MG 24 hr tablet Take 150 mg by mouth every morning.    [provider]  butalbital -acetaminophen -caffeine  (FIORICET) 50-325-40 MG tablet Take 1 tablet by mouth 2 (two) times daily as needed for headache or migraine. 05/03/23 05/02/24  Buena Carmine, NP  dicyclomine  (BENTYL ) 20 MG tablet Take 1 tablet (20 mg total) by mouth 2 (two) times daily. 03/08/23 04/07/23  Sonnie Dusky, PA-C  FLUoxetine (PROZAC) 20 MG capsule Take 20 mg by mouth daily. 03/13/23   [provider]  gabapentin  (NEURONTIN ) 300 MG capsule TAKE 2 CAPSULES BY MOUTH 3 TIMES A DAY 05/03/23   Adah Acron, MD  glycopyrrolate (ROBINUL) 2 MG tablet Take 2 mg by mouth 2 (two) times daily. 01/11/23   [provider]  meloxicam  (MOBIC ) 15 MG tablet Take 15 mg by mouth daily.    [provider]   ondansetron  (ZOFRAN ) 4 MG tablet Take 1 tablet (4 mg total) by mouth every 8 (eight) hours as needed for nausea or vomiting. 05/03/23   Buena Carmine, NP  propranolol (INDERAL) 10 MG tablet Take 10 mg by mouth. 03/13/23   [provider]  Semaglutide 7 MG TABS 1 tablet at least 30 minutes before first food, beverage or other oral medicine of the day Orally Once a day    [provider]  testosterone cypionate (DEPOTESTOSTERONE CYPIONATE) 200 MG/ML injection Inject 200 mg into the skin every 14 (fourteen) days. 01/17/23   [provider]      Allergies    Patient has no known allergies.    Review of Systems   Review of Systems  Constitutional:  Negative for fever.  Skin:  Positive for wound.    Physical Exam Updated Vital Signs BP (!) 131/92 (BP Location: Right Arm)   Pulse 81   Temp 98.2 F (36.8 C)   Resp 16   Ht 6' (1.829 m)   Wt 113.4 kg   SpO2 97%   BMI 33.91 kg/m  Physical Exam Vitals and nursing note reviewed.  Constitutional:      Appearance: Normal appearance.  HENT:     Head: Atraumatic.  Cardiovascular:     Rate and Rhythm: Normal rate and regular rhythm.     Comments: Cap refill less  than 2 seconds in digits of the hands bilaterally Pulmonary:     Effort: Pulmonary effort is normal.  Musculoskeletal:     Comments: Bilateral upper extremities:  General 1 cm scabbed over laceration to the dorsum of the right middle finger over the PIP joint.  Mild surrounding erythema.  No drainage or areas of fluctuance.    0.5cm diameter scabbed over area at the base of the left ring finger.  Mild surrounding erythema.  No pus drainage.  No red streaking  Palpation Nontender of the carpal bones bilaterally Nontender over the 1st through 5th metacarpals and 1st through 5th phalanges bilaterally.  No tenderness over the flexor tendon sheath of the right middle finger or left ring finger.  ROM Full flexion and extension at the wrists  bilaterally Full flexion extension at the 1st through 5th MCPs, PIPs, DIPs bilaterally  Sensation: Sensation intact throughout the 1st-5th digits   Neurological:     General: No focal deficit present.     Mental Status: He is alert.  Psychiatric:        Mood and Affect: Mood normal.        Behavior: Behavior normal.        ED Results / Procedures / Treatments   Labs (all labs ordered are listed, but only abnormal results are displayed) Labs Reviewed - No data to display  EKG None  Radiology No results found.  Procedures Procedures    Medications Ordered in ED Medications - No data to display  ED Course/ Medical Decision Making/ A&P                                 Medical Decision Making    Differential diagnosis includes but is not limited to cellulitis, deep space infection, flexor tenosynovitis  ED Course:  Upon initial evaluation, patient is well-appearing, stable vitals.  Has healing laceration to the dorsum of the right middle finger over PIP joint.  Also has a healing cut to the base of the left ring finger. Both with mild surrounding erythema, no pus drainage.  No areas of fluctuance concerning for abscess.  Able to fully flex and extend at the MCP, PIP, DIP the right middle finger and left ring finger.  No flexor tendon tenderness. Low concern for flexor tenosynovitis or tendon injury. No signs of retained foreign body. Neurovascularly intact.  Patient reports tetanus is up-to-date.  Appropriate for discharge home with course of doxycyline to treat for cellulitis.     Impression: Cellulitis surrounding right middle finger laceration Cellulitis from cut at base of left ring finger  Disposition:  The patient was discharged home with instructions to keep the laceration sites clean with soap and water .  Apply antibiotic ointment after cleaning the area.  Take course of doxycycline  as prescribed. Return precautions given.   This chart was dictated using  voice recognition software, Dragon. Despite the best efforts of this provider to proofread and correct errors, errors may still occur which can change documentation meaning.          Final Clinical Impression(s) / ED Diagnoses Final diagnoses:  Cellulitis of right middle finger  Cellulitis of left ring finger    Rx / DC Orders ED Discharge Orders          Ordered    doxycycline  (VIBRAMYCIN ) 100 MG capsule  2 times daily        06/09/23 1313  Rexie Catena, PA-C 06/09/23 1320    Sueellen Emery, MD 06/10/23 [id number]

## 2023-06-09 NOTE — ED Triage Notes (Signed)
 Pt reports RT middle finger lac x 6 days pta, endorses concern for infection d/t drainage. Denies fever

## 2023-06-09 NOTE — Discharge Instructions (Signed)
 You have a skin infection around the laceration of your right middle finger.  You have been prescribed doxycycline  to treat your skin infection. Take this antibiotic 2 times a day for the next 7 days. Take the full course of your antibiotic even if you start feeling better. Antibiotics may cause you to have diarrhea. This antibiotic can also cause your skin to be more sensitive to sunlight. Please cover up and wear sunscreen while in the sun.   Keep the laceration clean with soap and water . Place antibiotic ointment such as bacitracin or neosporin over your laceration after cleaning the area.  Return to the ER should you develop fever, chills, pus drainage from your wound, redness around your wound.

## 2023-07-23 ENCOUNTER — Telehealth: Payer: Self-pay | Admitting: Orthopaedic Surgery

## 2023-07-26 ENCOUNTER — Telehealth: Payer: Commercial Managed Care - HMO | Admitting: Diagnostic Neuroimaging

## 2023-08-26 ENCOUNTER — Encounter: Admitting: Orthopedic Surgery

## 2023-09-29 ENCOUNTER — Ambulatory Visit (INDEPENDENT_AMBULATORY_CARE_PROVIDER_SITE_OTHER): Admitting: Orthopedic Surgery

## 2023-09-29 ENCOUNTER — Other Ambulatory Visit (INDEPENDENT_AMBULATORY_CARE_PROVIDER_SITE_OTHER): Payer: Self-pay

## 2023-09-29 VITALS — BP 143/92 | HR 81 | Ht 72.0 in | Wt 233.6 lb

## 2023-09-29 DIAGNOSIS — M5416 Radiculopathy, lumbar region: Secondary | ICD-10-CM

## 2023-09-29 DIAGNOSIS — Z720 Tobacco use: Secondary | ICD-10-CM | POA: Diagnosis not present

## 2023-09-29 MED ORDER — GABAPENTIN 800 MG PO TABS: 800.0000 mg | ORAL_TABLET | Freq: Three times a day (TID) | ORAL | 2 refills | Status: DC

## 2023-09-29 MED ORDER — CYCLOBENZAPRINE HCL 10 MG PO TABS: 10.0000 mg | ORAL_TABLET | Freq: Three times a day (TID) | ORAL | 0 refills | Status: DC | PRN

## 2023-09-29 NOTE — Progress Notes (Signed)
 Orthopedic Spine Surgery Office Note  Assessment: Patient is a 33 y.o. male with chronic, progressive low back pain.  Does have pain rating along the lateral aspect of his thighs and legs consistent with radiculopathy   Plan: -Patient has tried PT, Tylenol , ibuprofen , gabapentin , oral steroids, steroid injections, topical treatments  -Patient has noticed relief with gabapentin , so new prescription provided to him today.  Also prescribed Flexeril  for the muscle tightness and spasms he feels -Would need to be nicotine free prior to any elective spine surgery -Patient should return to office on an as needed basis   I spent covering some of the health risks associated with smoking/nicotine use encouraged cessation. I discussed some of the specific risk seen with surgery nicotine use including increased rates of pseudoarthrosis, infection, and wound healing issues. Patient is not quite ready to quit.   ___________________________________________________________________________   History:  Patient is a 33 y.o. male who presents today for lumbar spine.  Patient states that he has had 10 years of low back pain which has gotten progressively worse with time.  He feels it in the lower lumbar region.  He also feels muscle tightness in the upper lumbar spine.  He does have pain radiating into the lower extremities.  He said it is worse on the right side.  He feels the going along the lateral aspect of the thigh and leg.  The majority and worst pain is in his back though.  There is no trauma or injury that preceded the onset of the pain.  No bowel or bladder incontinence.  No saddle anesthesia.  No weakness in the lower extremities.  Treatments tried: PT, Tylenol , ibuprofen , gabapentin , oral steroids, steroid injections, topical treatments  Review of systems: Denies fevers and chills, night sweats, unexplained weight loss, history of cancer.  Has had pain that wakes him at night   Past medical  history: Depression/anxiety Chronic back pain Migraines  Allergies: NKDA  Past surgical history:  Hernia repair  Social history: Reports use of nicotine product (smoking, vaping, patches, smokeless) Alcohol use: rare Reports marijuana use, denies other recreational drug use   Physical Exam:  BMI of 31.7  General: no acute distress, appears stated age Neurologic: alert, answering questions appropriately, following commands Respiratory: unlabored breathing on room air, symmetric chest rise Psychiatric: appropriate affect, normal cadence to speech   MSK (spine):  -Strength exam      Left  Right EHL    5/5  5/5 TA    5/5  5/5 GSC    5/5  5/5 Knee extension  5/5  5/5 Hip flexion   5/5  5/5  -Sensory exam    Sensation intact to light touch in L3-S1 nerve distributions of bilateral lower extremities  Imaging: XRs of the lumbar spine from 09/29/2023 were independently reviewed and interpreted, showing disc height loss at L4/5 and L5/S1.  No evidence of instability on flexion/extension views.  No fracture or dislocation seen.  MRI of the lumbar spine from 07/30/2022 was independently reviewed and interpreted, showing central stenosis at L4/5.  No other significant stenosis seen.  DDD at L4/5 and L5/S1.   Patient name: Joseph Vazquez Patient MRN: [id number] Date of visit: 09/29/23

## 2023-11-13 ENCOUNTER — Encounter: Payer: Self-pay | Admitting: Orthopedic Surgery

## 2023-11-13 DIAGNOSIS — M545 Low back pain, unspecified: Secondary | ICD-10-CM

## 2023-11-15 MED ORDER — METHYLPREDNISOLONE 4 MG PO TBPK: ORAL_TABLET | ORAL | 0 refills | Status: DC

## 2023-12-13 ENCOUNTER — Encounter: Payer: Self-pay | Admitting: Radiology

## 2023-12-16 ENCOUNTER — Other Ambulatory Visit: Payer: Self-pay | Admitting: Orthopedic Surgery

## 2024-01-08 ENCOUNTER — Other Ambulatory Visit: Payer: Self-pay | Admitting: Orthopedic Surgery

## 2024-01-31 ENCOUNTER — Encounter: Payer: Self-pay | Admitting: Orthopedic Surgery

## 2024-01-31 MED ORDER — GABAPENTIN 800 MG PO TABS: 800.0000 mg | ORAL_TABLET | Freq: Three times a day (TID) | ORAL | 2 refills | Status: AC

## 2024-03-02 ENCOUNTER — Ambulatory Visit: Admission: EM | Admit: 2024-03-02 | Discharge: 2024-03-02 | Disposition: A | Discharge status: Home / Self Care

## 2024-03-02 ENCOUNTER — Encounter: Payer: Self-pay | Admitting: Emergency Medicine

## 2024-03-02 DIAGNOSIS — S46812A Strain of other muscles, fascia and tendons at shoulder and upper arm level, left arm, initial encounter: Secondary | ICD-10-CM

## 2024-03-02 DIAGNOSIS — M5410 Radiculopathy, site unspecified: Secondary | ICD-10-CM

## 2024-03-02 MED ORDER — METHYLPREDNISOLONE 4 MG PO TBPK: ORAL_TABLET | ORAL | 0 refills | Status: AC

## 2024-03-02 MED ORDER — METHOCARBAMOL 750 MG PO TABS: 750.0000 mg | ORAL_TABLET | Freq: Four times a day (QID) | ORAL | 0 refills | Status: AC | PRN

## 2024-03-02 MED ORDER — IBUPROFEN 600 MG PO TABS: 600.0000 mg | ORAL_TABLET | Freq: Four times a day (QID) | ORAL | 0 refills | Status: AC | PRN

## 2024-03-02 NOTE — ED Provider Notes (Signed)
 Joseph Vazquez    CSN: [id number] Arrival date & time: 03/02/24  1506      History   Chief Complaint Chief Complaint  Patient presents with   Shoulder Injury   Arm Pain    HPI Joseph Vazquez is a 34 y.o. male.    Joseph Vazquez presents with left-sided neck and arm pain persisting for a little over a month. The pain began one morning without a clear precipitating event and has progressively worsened. Joseph Vazquez describes the pain as starting in the neck and extending down to the elbow, with current symptoms located in the left trapezius region. The pain is characterized by prickling, shocking, electric sensations, similar to hitting the funny bone, accompanied by burning, numbness, tingling, and a sensation of the upper arm being asleep. The pain severity currently rates at 5-6 out of 10, with a maximum intensity of 8-9 out of 10, which has been kept him awake at night on occasion. There is no specific movement that worsens the pain, though neck extension exacerbates the shocking sensation. Joseph Vazquez also reports headaches occurring most days over the past week, described as all-over head pain.  He denies weakness in the shoulder, arm, and hand.  Gabapentin  and Tylenol  have been attempted at home without relief.  He has a history of lumbago with sciatica but has never had pain at the level of the cervical spine or upper extremity radiculopathy.   The history is provided by the patient.  Shoulder Injury Associated symptoms include headaches.  Arm Pain Associated symptoms include headaches.    Past Medical History:  Diagnosis Date   Anxiety    Back pain    Depression    Headache    mirgrianes   Right knee pain 07/05/2012   Seizure (HCC)    Sprain of unspecified ligament of right ankle, initial encounter 03/03/2022    Patient Active Problem List   Diagnosis Date Noted   Degeneration of lumbar intervertebral disc 05/03/2023   Male hypogonadism 05/03/2023   Tobacco user 05/03/2023    Strangulated umbilical hernia 01/31/2023   Facet degeneration of lumbar region 03/13/2022   Protrusion of lumbar intervertebral disc 06/03/2020   Radiculopathy, lumbar region 06/03/2020   Nicotine addiction 12/01/2011   Gastroesophageal reflux disease 09/17/2008   OTHER SEBORRHEIC DERMATITIS 09/17/2008   Hyperhidrosis 09/17/2008    Past Surgical History:  Procedure Laterality Date   HERNIA REPAIR     VENTRAL HERNIA REPAIR N/A 08/07/2018   Procedure: HERNIA REPAIR umblical;  Surgeon: Signe Mitzie LABOR, MD;  Location: [facility];  Service: General;  Laterality: N/A;   XI ROBOTIC ASSISTED VENTRAL HERNIA N/A 02/26/2023   Procedure: ROBOTIC INCISIONAL HERNIA REPAIR WITH MESH;  Surgeon: Polly Cordella LABOR, MD;  Location: [facility];  Service: General;  Laterality: N/A;  GEN AND TAP BLOCK       Home Medications    Prior to Admission medications  Medication Sig Start Date End Date Taking? Authorizing Provider  ibuprofen  (ADVIL ) 600 MG tablet Take 1 tablet (600 mg total) by mouth every 6 (six) hours as needed. 03/02/24  Yes Leatrice Vernell HERO, NP  methocarbamol  (ROBAXIN ) 750 MG tablet Take 1 tablet (750 mg total) by mouth 4 (four) times daily as needed for muscle spasms. 03/02/24  Yes Leatrice Vernell HERO, NP  methylPREDNISolone  (MEDROL  DOSEPAK) 4 MG TBPK tablet Day 1: take 6 tabs in the morning; Day 2: take 5 tabs in the morning, decreasing by one pill each day until complete 03/02/24  Yes Leatrice,  Vernell HERO, NP  buPROPion (WELLBUTRIN XL) 150 MG 24 hr tablet Take 150 mg by mouth every morning.    [provider]  butalbital -acetaminophen -caffeine  (FIORICET) 50-325-40 MG tablet Take 1 tablet by mouth 2 (two) times daily as needed for headache or migraine. 05/03/23 05/02/24  Arloa Suzen RAMAN, NP  dicyclomine  (BENTYL ) 20 MG tablet Take 1 tablet (20 mg total) by mouth 2 (two) times daily. 03/08/23 04/07/23  Waddell Sluder, PA-C  FLUoxetine (PROZAC) 20 MG capsule Take 20 mg by mouth daily. 03/13/23    [provider]  gabapentin  (NEURONTIN ) 800 MG tablet Take 1 tablet (800 mg total) by mouth 3 (three) times daily. 01/31/24   Georgina Ozell LABOR, MD  glycopyrrolate (ROBINUL) 2 MG tablet Take 2 mg by mouth 2 (two) times daily. 01/11/23   [provider]  meloxicam  (MOBIC ) 15 MG tablet Take 15 mg by mouth daily.    [provider]  ondansetron  (ZOFRAN ) 4 MG tablet Take 1 tablet (4 mg total) by mouth every 8 (eight) hours as needed for nausea or vomiting. 05/03/23   Arloa Suzen RAMAN, NP  propranolol (INDERAL) 10 MG tablet Take 10 mg by mouth. 03/13/23   [provider]  Semaglutide 7 MG TABS 1 tablet at least 30 minutes before first food, beverage or other oral medicine of the day Orally Once a day    [provider]  testosterone cypionate (DEPOTESTOSTERONE CYPIONATE) 200 MG/ML injection Inject 200 mg into the skin every 14 (fourteen) days. 01/17/23   [provider]    Family History Family History  Problem Relation Age of Onset   Cancer Mother    Healthy Father     Social History Social History[1]   Allergies   Patient has no known allergies.   Review of Systems Review of Systems  Constitutional: Negative.   Musculoskeletal:  Positive for arthralgias, myalgias and neck pain. Negative for back pain, gait problem, joint swelling and neck stiffness.  Skin: Negative.   Neurological:  Positive for numbness and headaches. Negative for dizziness, tremors, syncope, weakness and light-headedness.     Physical Exam Triage Vital Signs ED Triage Vitals [03/02/24 1544]  Encounter Vitals Group     BP 130/87     Girls Systolic BP Percentile      Girls Diastolic BP Percentile      Boys Systolic BP Percentile      Boys Diastolic BP Percentile      Pulse Rate 82     Resp 16     Temp 98 F (36.7 C)     Temp Source Oral     SpO2 96 %     Weight      Height      Head Circumference      Peak Flow      Pain Score 7     Pain Loc       Pain Education      Exclude from Growth Chart    No data found.  Updated Vital Signs BP 130/87 (BP Location: Right Arm)   Pulse 82   Temp 98 F (36.7 C) (Oral)   Resp 16   SpO2 96%   Visual Acuity Right Eye Distance:   Left Eye Distance:   Bilateral Distance:    Right Eye Near:   Left Eye Near:    Bilateral Near:     Physical Exam Vitals and nursing note reviewed.  Constitutional:      General: He is not in  acute distress.    Appearance: Normal appearance. He is normal weight. He is not toxic-appearing.  Eyes:     Conjunctiva/sclera: Conjunctivae normal.  Cardiovascular:     Rate and Rhythm: Normal rate and regular rhythm.     Heart sounds: Normal heart sounds.  Pulmonary:     Effort: Pulmonary effort is normal.     Breath sounds: Normal breath sounds and air entry.  Musculoskeletal:     Left shoulder: Tenderness (Left trapezius muscle, diffuse.  Spasms noted) present. No swelling, deformity, effusion, laceration, bony tenderness or crepitus. Normal range of motion. Normal strength. Normal pulse.     Left upper arm: Tenderness (Trapezius) present. No swelling, edema, deformity, lacerations or bony tenderness.     Right hand: Normal strength.     Left hand: Normal strength.     Cervical back: Tenderness (Left paraspinals) present. No swelling, edema, deformity, erythema, signs of trauma, lacerations, rigidity, spasms, torticollis or bony tenderness. Pain with movement (Extension and right rotation) present. Normal range of motion.     Lumbar back: Decreased range of motion.  Skin:    General: Skin is warm and dry.     Findings: No rash.  Neurological:     Mental Status: He is alert and oriented to person, place, and time.  Psychiatric:        Mood and Affect: Mood normal.        Behavior: Behavior normal.      Vazquez Treatments / Results  Labs (all labs ordered are listed, but only abnormal results are displayed) Labs Reviewed - No data to  display  EKG   Radiology No results found.  Procedures Procedures (including critical care time)  Medications Ordered in Vazquez Medications - No data to display  Initial Impression / Assessment and Plan / Vazquez Course  I have reviewed the triage vital signs and the nursing notes.  Pertinent labs & imaging results that were available during my care of the patient were reviewed by me and considered in my medical decision making (see chart for details).    MDM: Joseph Vazquez likely has a trapezius muscle strain, causing nerve compression leading to the described nerve pain and symptoms. The absence of any injury or tenderness along the neck's midline leaves me with low suspicion for spinal pathology. I prescribed a Medrol  Dosepak to reduce inflammation and improve nerve communication. Joseph Vazquez will start the steroid tomorrow morning Cyclobenzaprine , previously used for low back pain, was not effective, so I prescribed methocarbamol  as a muscle relaxant, which can be taken four times a day. Additionally, I advised him to use heat therapy and ibuprofen  600 mg every six hours as an anti-inflammatory measure. This multi-layered approach aims for gradual improvement, much like his prior sciatica treatment. Joseph Vazquez was reminded to return if for any concerning symptoms such as severe pain, neck rigidity, decreased shoulder range of motion, weakness of the left upper extremity, progressive numbness, tingling, and paresthesia traveling down the arm, severe headache, blurred vision, dizziness.    Final diagnoses:  Strain of left trapezius muscle, initial encounter  Radiculopathy of arm     Discharge Instructions       A trapezius muscle strain occurs when the muscle in the upper back/neck area becomes overstretched or irritated. This can cause localized neck and shoulder pain and may irritate nearby nerves, leading to pain, tingling, or numbness that travels down the left arm. Symptoms are expected to improve with  conservative care. Common Symptoms Neck, upper back, or shoulder  pain Muscle tightness or spasms Pain radiating into the left arm Tingling or numbness in the arm or hand Stiffness or limited range of motion Expected Recovery Most muscle strains improve over 1-3 weeks with proper treatment. Nerve-related symptoms often improve gradually as inflammation decreases. Full recovery may take longer if symptoms were severe, but improvement should be noticeable within the first several days.  Treatment Plan Ibuprofen  600 mg every 6 hours as needed for pain Helps reduce pain and inflammation Take with food to protect your stomach Avoid taking other NSAIDs (such as naproxen ) Common side effects: stomach upset, heartburn, nausea Methocarbamol  750 mg up to four times daily as needed for muscle spasms Helps relax tight or spasming muscles May cause drowsiness or dizziness Avoid driving or operating machinery until you know how it affects you Methylprednisolone  (Medrol  Dose Pack) A short course of steroids that reduces inflammation around muscles and irritated nerves Take exactly as prescribed following the taper: 6, 5, 4, 3, 2, 1 Take with food Common side effects: increased appetite, trouble sleeping, mood changes, stomach upset  Symptom Management & Home Care Heat therapy: Apply a heating pad or warm compress to the affected area for 15-20 minutes, 2-3 times daily Stretching: Gentle neck and shoulder stretches once pain allows; avoid sudden or forceful movements Activity modification: Avoid heavy lifting, overhead activity, and prolonged poor posture Posture support: Maintain neutral neck and shoulder posture, especially during screen use Sleep support: Use a supportive pillow and avoid sleeping on the affected side if painful  When to Seek Medical Care Contact your healthcare provider if: Pain is not improving after 7-10 days Arm pain, numbness, or tingling worsens Muscle weakness  develops Pain interferes with sleep or daily activities despite treatment Medication side effects become problematic When to Go to the Emergency Department Go to the ED immediately if you experience: New or worsening arm or hand weakness Loss of bowel or bladder control Severe, unrelenting pain not relieved by medication New numbness involving the entire arm or hand Fever, chills, or signs of infection along with neck pain     ED Prescriptions     Medication Sig Dispense Auth. Provider   ibuprofen  (ADVIL ) 600 MG tablet Take 1 tablet (600 mg total) by mouth every 6 (six) hours as needed. 30 tablet Leatrice Vernell HERO, NP   methocarbamol  (ROBAXIN ) 750 MG tablet Take 1 tablet (750 mg total) by mouth 4 (four) times daily as needed for muscle spasms. 28 tablet Leatrice Vernell HERO, NP   methylPREDNISolone  (MEDROL  DOSEPAK) 4 MG TBPK tablet Day 1: take 6 tabs in the morning; Day 2: take 5 tabs in the morning, decreasing by one pill each day until complete 21 tablet Leatrice Vernell HERO, NP      PDMP not reviewed this encounter.    [1]  Social History Tobacco Use   Smoking status: Former    Current packs/day: 0.05    Types: Cigarettes   Smokeless tobacco: Never  Vaping Use   Vaping status: Every Day   Substances: Nicotine, Flavoring  Substance Use Topics   Alcohol use: Yes    Comment: Socially   Drug use: Yes    Types: Marijuana     Leatrice Vernell HERO, NP 03/02/24 1739  "

## 2024-03-02 NOTE — Discharge Instructions (Addendum)
" °  A trapezius muscle strain occurs when the muscle in the upper back/neck area becomes overstretched or irritated. This can cause localized neck and shoulder pain and may irritate nearby nerves, leading to pain, tingling, or numbness that travels down the left arm. Symptoms are expected to improve with conservative care. Common Symptoms Neck, upper back, or shoulder pain Muscle tightness or spasms Pain radiating into the left arm Tingling or numbness in the arm or hand Stiffness or limited range of motion Expected Recovery Most muscle strains improve over 1-3 weeks with proper treatment. Nerve-related symptoms often improve gradually as inflammation decreases. Full recovery may take longer if symptoms were severe, but improvement should be noticeable within the first several days.  Treatment Plan Ibuprofen  600 mg every 6 hours as needed for pain Helps reduce pain and inflammation Take with food to protect your stomach Avoid taking other NSAIDs (such as naproxen ) Common side effects: stomach upset, heartburn, nausea Methocarbamol  750 mg up to four times daily as needed for muscle spasms Helps relax tight or spasming muscles May cause drowsiness or dizziness Avoid driving or operating machinery until you know how it affects you Methylprednisolone  (Medrol  Dose Pack) A short course of steroids that reduces inflammation around muscles and irritated nerves Take exactly as prescribed following the taper: 6, 5, 4, 3, 2, 1 Take with food Common side effects: increased appetite, trouble sleeping, mood changes, stomach upset  Symptom Management & Home Care Heat therapy: Apply a heating pad or warm compress to the affected area for 15-20 minutes, 2-3 times daily Stretching: Gentle neck and shoulder stretches once pain allows; avoid sudden or forceful movements Activity modification: Avoid heavy lifting, overhead activity, and prolonged poor posture Posture support: Maintain neutral neck and  shoulder posture, especially during screen use Sleep support: Use a supportive pillow and avoid sleeping on the affected side if painful  When to Seek Medical Care Contact your healthcare provider if: Pain is not improving after 7-10 days Arm pain, numbness, or tingling worsens Muscle weakness develops Pain interferes with sleep or daily activities despite treatment Medication side effects become problematic When to Go to the Emergency Department Go to the ED immediately if you experience: New or worsening arm or hand weakness Loss of bowel or bladder control Severe, unrelenting pain not relieved by medication New numbness involving the entire arm or hand Fever, chills, or signs of infection along with neck pain  "

## 2024-03-02 NOTE — ED Triage Notes (Signed)
 Pt c/o left arm pain that begins in neck and radiates down to elbow for about 1 month. States he is having some weakness and burning in arm and it feels like a pinched nerve.   Denies any known injury.

## 2024-03-17 ENCOUNTER — Other Ambulatory Visit: Payer: Self-pay

## 2024-03-17 ENCOUNTER — Emergency Department (HOSPITAL_BASED_OUTPATIENT_CLINIC_OR_DEPARTMENT_OTHER)
Admission: EM | Admit: 2024-03-17 | Discharge: 2024-03-17 | Disposition: A | Discharge status: Home / Self Care | Payer: Self-pay | Source: Home / Self Care | Attending: Emergency Medicine | Admitting: Emergency Medicine

## 2024-03-17 ENCOUNTER — Other Ambulatory Visit (HOSPITAL_BASED_OUTPATIENT_CLINIC_OR_DEPARTMENT_OTHER): Payer: Self-pay

## 2024-03-17 ENCOUNTER — Encounter: Payer: Self-pay | Admitting: Orthopedic Surgery

## 2024-03-17 ENCOUNTER — Encounter (HOSPITAL_BASED_OUTPATIENT_CLINIC_OR_DEPARTMENT_OTHER): Payer: Self-pay

## 2024-03-17 DIAGNOSIS — M5412 Radiculopathy, cervical region: Secondary | ICD-10-CM

## 2024-03-17 MED ORDER — DEXAMETHASONE SOD PHOSPHATE PF 10 MG/ML IJ SOLN
10.0000 mg | Freq: Once | INTRAMUSCULAR | Status: AC
  Administered 2024-03-17: 10 mg via INTRAMUSCULAR
  Filled 2024-03-17: qty 1

## 2024-03-17 MED ORDER — LIDOCAINE 5 % EX PTCH
1.0000 | MEDICATED_PATCH | CUTANEOUS | 0 refills | Status: AC
  Filled 2024-03-17: qty 30, 30d supply, fill #0

## 2024-03-17 MED ORDER — HYDROCODONE-ACETAMINOPHEN 5-325 MG PO TABS
1.0000 | ORAL_TABLET | ORAL | 0 refills | Status: AC | PRN
  Filled 2024-03-17: qty 8, 1d supply, fill #0

## 2024-03-17 NOTE — ED Provider Notes (Signed)
 " [place] EMERGENCY DEPARTMENT AT [facility] Provider Note   CSN: [id number] Arrival date & time: 03/17/24  1301     Patient presents with: Shoulder Pain (Left ) and Neck Pain   Isaish Alemu is a 34 y.o. male.   Patient is a 34 year old male who presents with neck pain.  He has pain in his left neck that radiates down his left arm.  He says been going on about a month.  He denies any known injury.  No fevers.  Is worse with certain movement.  He has burning pain down his arm.  He sometimes will have some tingling in his hand but no persistent numbness.  He reports some weakness in the arm and that he has weakness lifting things or lifting things over his head but he does not have any decreased grip strength.  He is not dropping things.  He has a prior history of lumbar back disease and is followed by orthopedics.  He has not yet followed up with orthopedics for these symptoms.  He did see urgent care where he was given a prescription for a muscle relaxer and a steroid pack but he says that has not really helped.       Prior to Admission medications  Medication Sig Start Date End Date Taking? Authorizing Provider  HYDROcodone -acetaminophen  (NORCO/VICODIN) 5-325 MG tablet Take 1-2 tablets by mouth every 4 (four) hours as needed. 03/17/24  Yes Lenor Hollering, MD  lidocaine  (LIDODERM ) 5 % Place 1 patch onto the skin daily. Remove & Discard patch within 12 hours or as directed by MD 03/17/24  Yes Lenor Hollering, MD  buPROPion (WELLBUTRIN XL) 150 MG 24 hr tablet Take 150 mg by mouth every morning.    [provider]  butalbital -acetaminophen -caffeine  (FIORICET) 50-325-40 MG tablet Take 1 tablet by mouth 2 (two) times daily as needed for headache or migraine. 05/03/23 05/02/24  Arloa Suzen RAMAN, NP  dicyclomine  (BENTYL ) 20 MG tablet Take 1 tablet (20 mg total) by mouth 2 (two) times daily. 03/08/23 04/07/23  Waddell Sluder, PA-C  FLUoxetine (PROZAC) 20 MG capsule Take 20 mg by mouth  daily. 03/13/23   [provider]  gabapentin  (NEURONTIN ) 800 MG tablet Take 1 tablet (800 mg total) by mouth 3 (three) times daily. 01/31/24   Georgina Ozell LABOR, MD  glycopyrrolate (ROBINUL) 2 MG tablet Take 2 mg by mouth 2 (two) times daily. 01/11/23   [provider]  ibuprofen  (ADVIL ) 600 MG tablet Take 1 tablet (600 mg total) by mouth every 6 (six) hours as needed. 03/02/24   Leatrice Vernell HERO, NP  meloxicam  (MOBIC ) 15 MG tablet Take 15 mg by mouth daily.    [provider]  methocarbamol  (ROBAXIN ) 750 MG tablet Take 1 tablet (750 mg total) by mouth 4 (four) times daily as needed for muscle spasms. 03/02/24   Leatrice Vernell HERO, NP  methylPREDNISolone  (MEDROL  DOSEPAK) 4 MG TBPK tablet Day 1: take 6 tabs in the morning; Day 2: take 5 tabs in the morning, decreasing by one pill each day until complete 03/02/24   Leatrice Vernell HERO, NP  ondansetron  (ZOFRAN ) 4 MG tablet Take 1 tablet (4 mg total) by mouth every 8 (eight) hours as needed for nausea or vomiting. 05/03/23   Arloa Suzen RAMAN, NP  propranolol (INDERAL) 10 MG tablet Take 10 mg by mouth. 03/13/23   [provider]  Semaglutide 7 MG TABS 1 tablet at least 30 minutes before first food, beverage or other oral medicine  of the day Orally Once a day    [provider]  testosterone cypionate (DEPOTESTOSTERONE CYPIONATE) 200 MG/ML injection Inject 200 mg into the skin every 14 (fourteen) days. 01/17/23   [provider]    Allergies: Patient has no known allergies.    Review of Systems  Constitutional:  Negative for fever.  Gastrointestinal:  Negative for nausea and vomiting.  Musculoskeletal:  Positive for neck pain. Negative for arthralgias, back pain and joint swelling.  Skin:  Negative for wound.  Neurological:  Positive for weakness and numbness. Negative for headaches.    Updated Vital Signs BP (!) 146/95 (BP Location: Right Arm)   Pulse 89   Temp 98 F (36.7 C)   Resp 16   Ht 6'  (1.829 m)   Wt 108.9 kg   SpO2 99%   BMI 32.55 kg/m   Physical Exam Constitutional:      Appearance: He is well-developed.  HENT:     Head: Normocephalic and atraumatic.  Neck:     Comments: Positive tenderness to the left paracervical area and over the left trapezius muscle, no swelling.  No warmth or erythema. Cardiovascular:     Rate and Rhythm: Normal rate.  Pulmonary:     Effort: Pulmonary effort is normal.  Musculoskeletal:     Cervical back: Normal range of motion and neck supple.     Comments: No swelling of extremities  Skin:    General: Skin is warm and dry.  Neurological:     Mental Status: He is alert and oriented to person, place, and time.     Comments: Motor 5 out of 5 all extremities, sensation grossly intact to light touch in his upper extremities bilaterally, radial pulses are intact     (all labs ordered are listed, but only abnormal results are displayed) Labs Reviewed - No data to display  EKG: None  Radiology: No results found.   Procedures   Medications Ordered in the ED  dexamethasone  (DECADRON ) injection 10 mg (has no administration in time range)                                    Medical Decision Making Risk Prescription drug management.   Patient is a 34 year old who presents with left sided radicular neck pain.  He reports some weakness in the arm but no ongoing numbness.  He has good strength on my exam.  He has had prior herniated disc in his lumbar spine is concerned about the same.  He does not have any fevers or clinical concerns for infection.  He does not have any recent injuries of the neck which would be more concerning for acute traumatic injury.  He was given a shot of Decadron  in the ED.  He is already taking anti-inflammatory medications, muscle relaxants and gabapentin .  Will add Lidoderm  patches and gave him a prescription for a few tablets of hydrocodone .  Encouraged him to have him make an appointment to have close  follow-up with his orthopedist.  Return precautions were given.     Final diagnoses:  Cervical radiculopathy    ED Discharge Orders          Ordered    HYDROcodone -acetaminophen  (NORCO/VICODIN) 5-325 MG tablet  Every 4 hours PRN        03/17/24 1352    lidocaine  (LIDODERM ) 5 %  Every 24 hours  03/17/24 1352               Lenor Hollering, MD 03/17/24 1358  "

## 2024-03-17 NOTE — ED Triage Notes (Signed)
 Arrives ambulatory to the ED with complaints of worsening left side neck pain going down his left arm for one month. Patient was seen at Urgent Care for the same and given prescription meds with minimal relief. No falls or injuries.

## 2024-03-17 NOTE — Discharge Instructions (Addendum)
 Make an appointment to have close follow-up with your orthopedist.  Return to the emergency room if you have any worsening symptoms.

## 2024-03-17 NOTE — ED Notes (Signed)

## 2024-03-20 ENCOUNTER — Ambulatory Visit: Payer: Self-pay | Admitting: Orthopedic Surgery
